# Patient Record
Sex: Female | Born: 1945 | Race: White | Hispanic: No | Smoking: Former smoker
Health system: Southern US, Community
[De-identification: ages and names within clinical notes are randomized; demographics above are authoritative.]

## PROBLEM LIST (undated history)

## (undated) DIAGNOSIS — K5732 Diverticulitis of large intestine without perforation or abscess without bleeding: Secondary | ICD-10-CM

## (undated) DIAGNOSIS — K5909 Other constipation: Secondary | ICD-10-CM

## (undated) DIAGNOSIS — K219 Gastro-esophageal reflux disease without esophagitis: Secondary | ICD-10-CM

## (undated) DIAGNOSIS — K8020 Calculus of gallbladder without cholecystitis without obstruction: Secondary | ICD-10-CM

## (undated) DIAGNOSIS — R404 Transient alteration of awareness: Secondary | ICD-10-CM

## (undated) DIAGNOSIS — E538 Deficiency of other specified B group vitamins: Secondary | ICD-10-CM

## (undated) DIAGNOSIS — R7402 Elevation of levels of lactic acid dehydrogenase (LDH): Secondary | ICD-10-CM

## (undated) DIAGNOSIS — N816 Rectocele: Secondary | ICD-10-CM

## (undated) DIAGNOSIS — R7401 Elevation of levels of liver transaminase levels: Secondary | ICD-10-CM

## (undated) DIAGNOSIS — G35 Multiple sclerosis: Secondary | ICD-10-CM

## (undated) DIAGNOSIS — E785 Hyperlipidemia, unspecified: Secondary | ICD-10-CM

## (undated) DIAGNOSIS — E559 Vitamin D deficiency, unspecified: Secondary | ICD-10-CM

## (undated) DIAGNOSIS — F341 Dysthymic disorder: Secondary | ICD-10-CM

## (undated) DIAGNOSIS — R3919 Other difficulties with micturition: Secondary | ICD-10-CM

## (undated) DIAGNOSIS — M810 Age-related osteoporosis without current pathological fracture: Secondary | ICD-10-CM

## (undated) DIAGNOSIS — I10 Essential (primary) hypertension: Secondary | ICD-10-CM

## (undated) DIAGNOSIS — R5383 Other fatigue: Secondary | ICD-10-CM

## (undated) DIAGNOSIS — R51 Headache: Secondary | ICD-10-CM

## (undated) DIAGNOSIS — R5381 Other malaise: Secondary | ICD-10-CM

## (undated) DIAGNOSIS — G35D Multiple sclerosis, unspecified: Secondary | ICD-10-CM

## (undated) DIAGNOSIS — R740 Nonspecific elevation of levels of transaminase and lactic acid dehydrogenase [LDH]: Secondary | ICD-10-CM

## (undated) DIAGNOSIS — M24549 Contracture, unspecified hand: Secondary | ICD-10-CM

## (undated) DIAGNOSIS — R32 Unspecified urinary incontinence: Secondary | ICD-10-CM

## (undated) HISTORY — DX: Other fatigue: R53.83

## (undated) HISTORY — DX: Contracture, unspecified hand: M24.549

## (undated) HISTORY — DX: Diverticulitis of large intestine without perforation or abscess without bleeding: K57.32

## (undated) HISTORY — DX: Elevation of levels of liver transaminase levels: R74.01

## (undated) HISTORY — DX: Other constipation: K59.09

## (undated) HISTORY — DX: Multiple sclerosis, unspecified: G35.D

## (undated) HISTORY — DX: Age-related osteoporosis without current pathological fracture: M81.0

## (undated) HISTORY — DX: Gastro-esophageal reflux disease without esophagitis: K21.9

## (undated) HISTORY — DX: Dysthymic disorder: F34.1

## (undated) HISTORY — PX: TUBAL LIGATION: SHX77

## (undated) HISTORY — DX: Unspecified urinary incontinence: R32

## (undated) HISTORY — DX: Transient alteration of awareness: R40.4

## (undated) HISTORY — DX: Other difficulties with micturition: R39.19

## (undated) HISTORY — DX: Deficiency of other specified B group vitamins: E53.8

## (undated) HISTORY — DX: Calculus of gallbladder without cholecystitis without obstruction: K80.20

## (undated) HISTORY — DX: Other malaise: R53.81

## (undated) HISTORY — PX: BACLOFEN PUMP REFILL: SHX1212

## (undated) HISTORY — DX: Hyperlipidemia, unspecified: E78.5

## (undated) HISTORY — DX: Essential (primary) hypertension: I10

## (undated) HISTORY — DX: Elevation of levels of lactic acid dehydrogenase (LDH): R74.02

## (undated) HISTORY — DX: Vitamin D deficiency, unspecified: E55.9

## (undated) HISTORY — PX: OTHER SURGICAL HISTORY: SHX169

## (undated) HISTORY — DX: Rectocele: N81.6

## (undated) HISTORY — DX: Other fatigue: R53.81

## (undated) HISTORY — DX: Headache: R51

## (undated) HISTORY — DX: Nonspecific elevation of levels of transaminase and lactic acid dehydrogenase (ldh): R74.0

## (undated) HISTORY — DX: Multiple sclerosis: G35

## (undated) HISTORY — PX: TONSILLECTOMY AND ADENOIDECTOMY: SUR1326

---

## 1995-06-01 HISTORY — PX: OTHER SURGICAL HISTORY: SHX169

## 2002-09-29 HISTORY — PX: OTHER SURGICAL HISTORY: SHX169

## 2004-05-31 ENCOUNTER — Encounter: Payer: Self-pay | Admitting: Family Medicine

## 2004-05-31 LAB — HM MAMMOGRAPHY

## 2004-05-31 LAB — CONVERTED CEMR LAB

## 2005-07-29 ENCOUNTER — Ambulatory Visit: Payer: Self-pay | Admitting: Family Medicine

## 2005-09-07 ENCOUNTER — Ambulatory Visit: Payer: Self-pay | Admitting: Family Medicine

## 2005-11-15 ENCOUNTER — Ambulatory Visit: Payer: Self-pay | Admitting: Family Medicine

## 2005-12-02 ENCOUNTER — Ambulatory Visit: Payer: Self-pay | Admitting: Internal Medicine

## 2005-12-21 ENCOUNTER — Ambulatory Visit: Payer: Self-pay | Admitting: Family Medicine

## 2006-01-25 ENCOUNTER — Ambulatory Visit: Payer: Self-pay | Admitting: Family Medicine

## 2006-06-17 ENCOUNTER — Ambulatory Visit: Payer: Self-pay | Admitting: Family Medicine

## 2006-08-23 ENCOUNTER — Encounter: Admission: RE | Admit: 2006-08-23 | Discharge: 2006-08-23 | Payer: Self-pay | Admitting: Neurology

## 2006-11-04 ENCOUNTER — Encounter: Payer: Self-pay | Admitting: Family Medicine

## 2006-11-04 DIAGNOSIS — R51 Headache: Secondary | ICD-10-CM

## 2006-11-04 DIAGNOSIS — I10 Essential (primary) hypertension: Secondary | ICD-10-CM | POA: Insufficient documentation

## 2006-11-04 DIAGNOSIS — G35 Multiple sclerosis: Secondary | ICD-10-CM

## 2006-11-04 DIAGNOSIS — R519 Headache, unspecified: Secondary | ICD-10-CM | POA: Insufficient documentation

## 2006-11-04 DIAGNOSIS — N816 Rectocele: Secondary | ICD-10-CM | POA: Insufficient documentation

## 2006-11-04 DIAGNOSIS — K219 Gastro-esophageal reflux disease without esophagitis: Secondary | ICD-10-CM | POA: Insufficient documentation

## 2006-11-04 DIAGNOSIS — N319 Neuromuscular dysfunction of bladder, unspecified: Secondary | ICD-10-CM

## 2006-11-04 DIAGNOSIS — M810 Age-related osteoporosis without current pathological fracture: Secondary | ICD-10-CM | POA: Insufficient documentation

## 2006-11-04 DIAGNOSIS — E785 Hyperlipidemia, unspecified: Secondary | ICD-10-CM

## 2006-11-08 ENCOUNTER — Ambulatory Visit: Payer: Self-pay | Admitting: Family Medicine

## 2006-11-09 LAB — CONVERTED CEMR LAB: Phosphorus: 3.4 mg/dL (ref 2.3–4.6)

## 2006-11-10 LAB — CONVERTED CEMR LAB: Vit D, 1,25-Dihydroxy: 26 (ref 20–57)

## 2006-11-13 ENCOUNTER — Encounter: Payer: Self-pay | Admitting: Family Medicine

## 2006-12-08 ENCOUNTER — Telehealth (INDEPENDENT_AMBULATORY_CARE_PROVIDER_SITE_OTHER): Payer: Self-pay | Admitting: *Deleted

## 2006-12-12 ENCOUNTER — Encounter: Payer: Self-pay | Admitting: Family Medicine

## 2006-12-14 ENCOUNTER — Encounter: Payer: Self-pay | Admitting: Family Medicine

## 2007-01-03 ENCOUNTER — Encounter: Payer: Self-pay | Admitting: Family Medicine

## 2007-01-17 ENCOUNTER — Ambulatory Visit: Payer: Self-pay | Admitting: Family Medicine

## 2007-04-06 ENCOUNTER — Encounter: Payer: Self-pay | Admitting: Family Medicine

## 2007-04-17 ENCOUNTER — Telehealth: Payer: Self-pay | Admitting: Family Medicine

## 2007-06-16 ENCOUNTER — Telehealth: Payer: Self-pay | Admitting: Family Medicine

## 2007-06-18 ENCOUNTER — Telehealth: Payer: Self-pay | Admitting: Internal Medicine

## 2007-06-26 ENCOUNTER — Telehealth (INDEPENDENT_AMBULATORY_CARE_PROVIDER_SITE_OTHER): Payer: Self-pay | Admitting: *Deleted

## 2007-07-10 ENCOUNTER — Encounter: Payer: Self-pay | Admitting: Internal Medicine

## 2007-07-11 ENCOUNTER — Encounter: Payer: Self-pay | Admitting: Family Medicine

## 2007-07-23 ENCOUNTER — Telehealth: Payer: Self-pay | Admitting: Internal Medicine

## 2007-07-23 ENCOUNTER — Inpatient Hospital Stay: Payer: Self-pay | Admitting: Internal Medicine

## 2007-07-23 ENCOUNTER — Encounter: Payer: Self-pay | Admitting: Family Medicine

## 2007-07-24 ENCOUNTER — Encounter: Payer: Self-pay | Admitting: Family Medicine

## 2007-07-27 ENCOUNTER — Encounter: Payer: Self-pay | Admitting: Family Medicine

## 2007-08-02 ENCOUNTER — Encounter: Payer: Self-pay | Admitting: Family Medicine

## 2007-08-03 ENCOUNTER — Ambulatory Visit: Payer: Self-pay | Admitting: Family Medicine

## 2007-08-03 ENCOUNTER — Telehealth: Payer: Self-pay | Admitting: Family Medicine

## 2007-08-03 DIAGNOSIS — K5909 Other constipation: Secondary | ICD-10-CM | POA: Insufficient documentation

## 2007-08-03 DIAGNOSIS — K8020 Calculus of gallbladder without cholecystitis without obstruction: Secondary | ICD-10-CM | POA: Insufficient documentation

## 2007-08-03 DIAGNOSIS — K5732 Diverticulitis of large intestine without perforation or abscess without bleeding: Secondary | ICD-10-CM | POA: Insufficient documentation

## 2007-08-09 ENCOUNTER — Telehealth: Payer: Self-pay | Admitting: Internal Medicine

## 2007-08-14 ENCOUNTER — Telehealth: Payer: Self-pay | Admitting: Family Medicine

## 2007-08-17 ENCOUNTER — Ambulatory Visit: Payer: Self-pay | Admitting: Unknown Physician Specialty

## 2007-08-22 ENCOUNTER — Ambulatory Visit: Payer: Self-pay | Admitting: Family Medicine

## 2007-08-24 ENCOUNTER — Encounter: Payer: Self-pay | Admitting: Family Medicine

## 2007-08-25 ENCOUNTER — Telehealth: Payer: Self-pay | Admitting: Family Medicine

## 2007-09-06 ENCOUNTER — Ambulatory Visit: Payer: Self-pay | Admitting: Family Medicine

## 2007-09-06 DIAGNOSIS — R740 Nonspecific elevation of levels of transaminase and lactic acid dehydrogenase [LDH]: Secondary | ICD-10-CM

## 2007-09-06 DIAGNOSIS — R7402 Elevation of levels of lactic acid dehydrogenase (LDH): Secondary | ICD-10-CM | POA: Insufficient documentation

## 2007-09-12 LAB — CONVERTED CEMR LAB
Albumin: 4.1 g/dL (ref 3.5–5.2)
Alkaline Phosphatase: 61 units/L (ref 39–117)
Bilirubin, Direct: 0.1 mg/dL (ref 0.0–0.3)

## 2007-09-29 ENCOUNTER — Encounter: Payer: Self-pay | Admitting: Internal Medicine

## 2007-10-10 ENCOUNTER — Telehealth: Payer: Self-pay | Admitting: Family Medicine

## 2007-10-13 ENCOUNTER — Telehealth: Payer: Self-pay | Admitting: Internal Medicine

## 2008-01-10 ENCOUNTER — Telehealth (INDEPENDENT_AMBULATORY_CARE_PROVIDER_SITE_OTHER): Payer: Self-pay | Admitting: *Deleted

## 2008-02-20 ENCOUNTER — Telehealth: Payer: Self-pay | Admitting: Family Medicine

## 2008-04-02 ENCOUNTER — Telehealth: Payer: Self-pay | Admitting: Family Medicine

## 2008-05-29 ENCOUNTER — Ambulatory Visit: Payer: Self-pay | Admitting: Family Medicine

## 2008-05-29 DIAGNOSIS — F341 Dysthymic disorder: Secondary | ICD-10-CM | POA: Insufficient documentation

## 2008-05-29 DIAGNOSIS — R5383 Other fatigue: Secondary | ICD-10-CM

## 2008-05-29 DIAGNOSIS — R5381 Other malaise: Secondary | ICD-10-CM

## 2008-06-03 LAB — CONVERTED CEMR LAB
ALT: 38 units/L — ABNORMAL HIGH (ref 0–35)
Albumin: 4.4 g/dL (ref 3.5–5.2)
Alkaline Phosphatase: 55 units/L (ref 39–117)
Calcium: 9.7 mg/dL (ref 8.4–10.5)
Chloride: 105 meq/L (ref 96–112)
Creatinine, Ser: 0.8 mg/dL (ref 0.4–1.2)
GFR calc Af Amer: 94 mL/min
GFR calc non Af Amer: 78 mL/min
HCT: 40.6 % (ref 36.0–46.0)
Hemoglobin: 14.3 g/dL (ref 12.0–15.0)
MCHC: 35.2 g/dL (ref 30.0–36.0)
Monocytes Absolute: 0.5 10*3/uL (ref 0.1–1.0)
Monocytes Relative: 7.5 % (ref 3.0–12.0)
Neutro Abs: 3.2 10*3/uL (ref 1.4–7.7)
Phosphorus: 4 mg/dL (ref 2.3–4.6)
RDW: 12.6 % (ref 11.5–14.6)
Total Protein: 7.3 g/dL (ref 6.0–8.3)
Vitamin B-12: 177 pg/mL — ABNORMAL LOW (ref 211–911)

## 2008-06-04 ENCOUNTER — Ambulatory Visit: Payer: Self-pay | Admitting: Family Medicine

## 2008-06-04 DIAGNOSIS — E538 Deficiency of other specified B group vitamins: Secondary | ICD-10-CM

## 2008-06-11 ENCOUNTER — Ambulatory Visit: Payer: Self-pay | Admitting: Family Medicine

## 2008-06-17 ENCOUNTER — Ambulatory Visit: Payer: Self-pay | Admitting: Family Medicine

## 2008-07-05 ENCOUNTER — Ambulatory Visit: Payer: Self-pay | Admitting: Family Medicine

## 2008-07-05 DIAGNOSIS — E559 Vitamin D deficiency, unspecified: Secondary | ICD-10-CM | POA: Insufficient documentation

## 2008-08-19 ENCOUNTER — Ambulatory Visit: Payer: Self-pay | Admitting: Family Medicine

## 2008-08-21 LAB — CONVERTED CEMR LAB: Vit D, 25-Hydroxy: 56 ng/mL (ref 30–89)

## 2008-10-07 ENCOUNTER — Ambulatory Visit: Payer: Self-pay | Admitting: Family Medicine

## 2008-10-07 ENCOUNTER — Telehealth: Payer: Self-pay | Admitting: Family Medicine

## 2008-11-18 ENCOUNTER — Ambulatory Visit: Payer: Self-pay | Admitting: Family Medicine

## 2008-11-21 ENCOUNTER — Ambulatory Visit: Payer: Self-pay | Admitting: Family Medicine

## 2008-11-25 ENCOUNTER — Encounter (INDEPENDENT_AMBULATORY_CARE_PROVIDER_SITE_OTHER): Payer: Self-pay | Admitting: *Deleted

## 2008-11-25 LAB — CONVERTED CEMR LAB: Vit D, 25-Hydroxy: 41 ng/mL (ref 30–89)

## 2008-12-29 HISTORY — PX: OTHER SURGICAL HISTORY: SHX169

## 2009-01-03 ENCOUNTER — Encounter: Payer: Self-pay | Admitting: Family Medicine

## 2009-01-07 ENCOUNTER — Telehealth: Payer: Self-pay | Admitting: Family Medicine

## 2009-01-23 LAB — HM DEXA SCAN

## 2009-01-28 ENCOUNTER — Ambulatory Visit: Payer: Self-pay | Admitting: Family Medicine

## 2009-01-29 ENCOUNTER — Telehealth: Payer: Self-pay | Admitting: Family Medicine

## 2009-02-05 ENCOUNTER — Ambulatory Visit: Payer: Self-pay | Admitting: Psychiatry

## 2009-02-06 ENCOUNTER — Encounter: Payer: Self-pay | Admitting: Family Medicine

## 2009-02-06 ENCOUNTER — Telehealth: Payer: Self-pay | Admitting: Family Medicine

## 2009-02-10 ENCOUNTER — Telehealth: Payer: Self-pay | Admitting: Family Medicine

## 2009-02-12 ENCOUNTER — Ambulatory Visit: Payer: Self-pay | Admitting: Psychiatry

## 2009-02-17 ENCOUNTER — Ambulatory Visit: Payer: Self-pay | Admitting: Psychiatry

## 2009-02-19 IMAGING — CT CT ABD-PELV W/ CM
1 of 2 series · 15 of 32 positions shown, 19 images · non-contrast
Comparison: none

REASON FOR EXAM: (1) LLQ pain; (2) LLQ pain, oral and IV contrast
COMMENTS:

[Series 2: abdomen · axial · 0.68mm/px · z∈[-532,-84]mm · 15 of 62 slices shown, 19 images]
[im 3/62  soft-tissue]
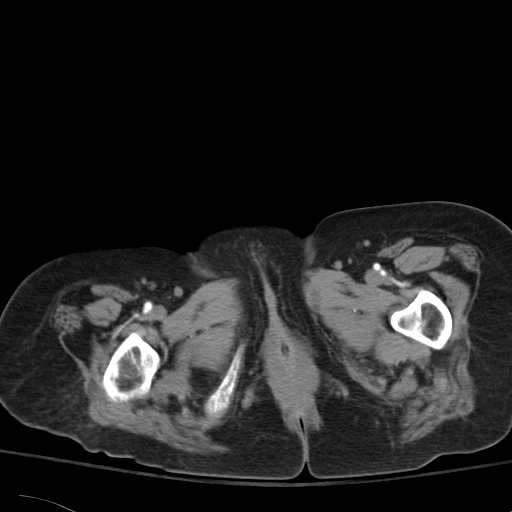
[im 3/62  bone]
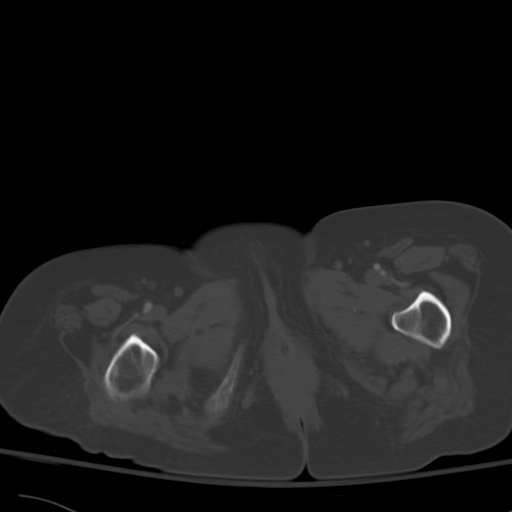
[im 8/62  soft-tissue]
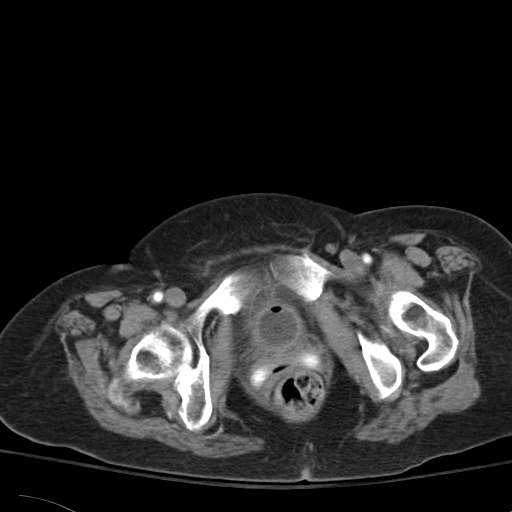
[im 14/62  soft-tissue]
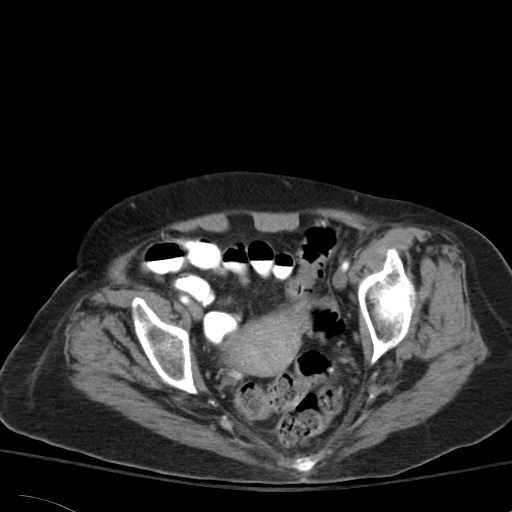
[im 16/62  soft-tissue]
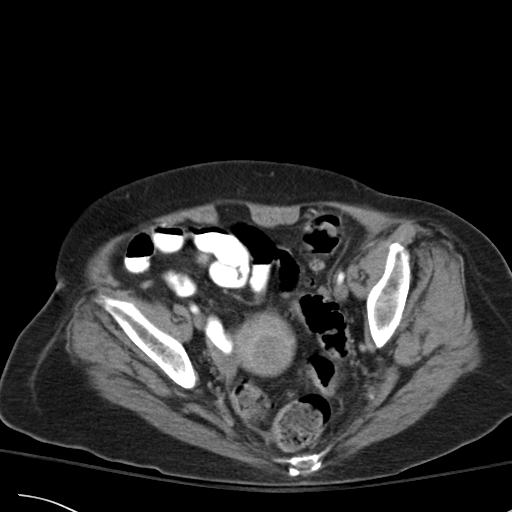
[im 22/62  soft-tissue]
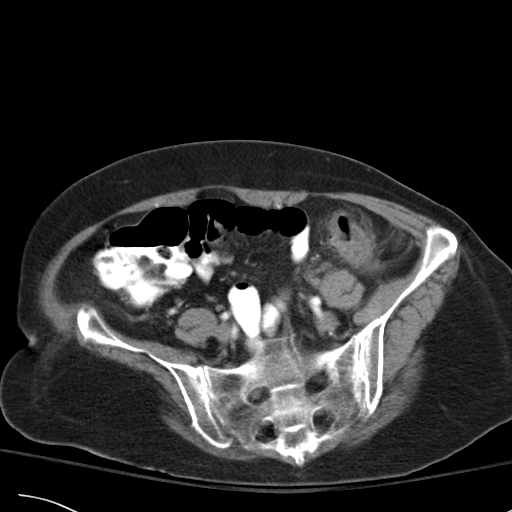
[im 27/62  soft-tissue]
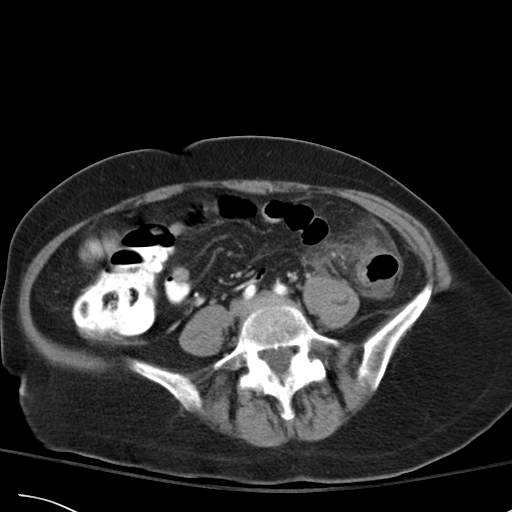
[im 32/62  soft-tissue]
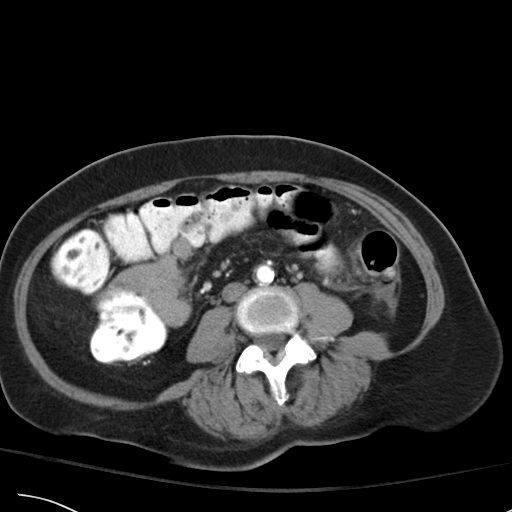
[im 35/62  soft-tissue]
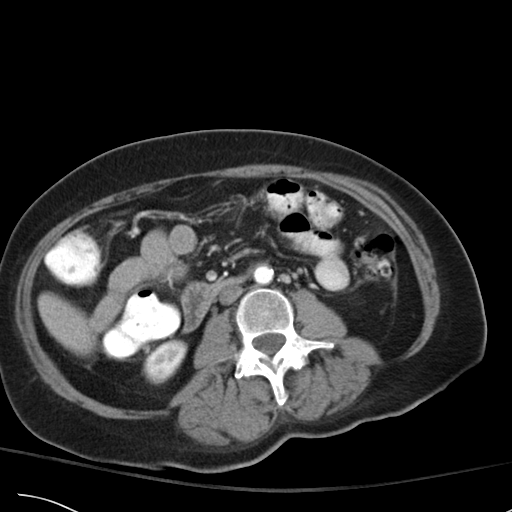
[im 40/62  soft-tissue]
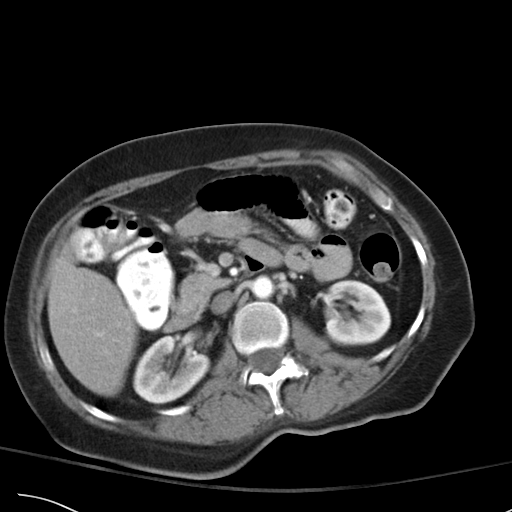
[im 40/62  bone]
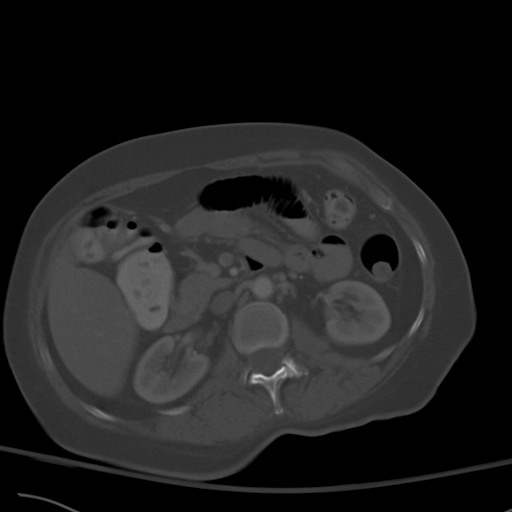
[im 46/62  soft-tissue]
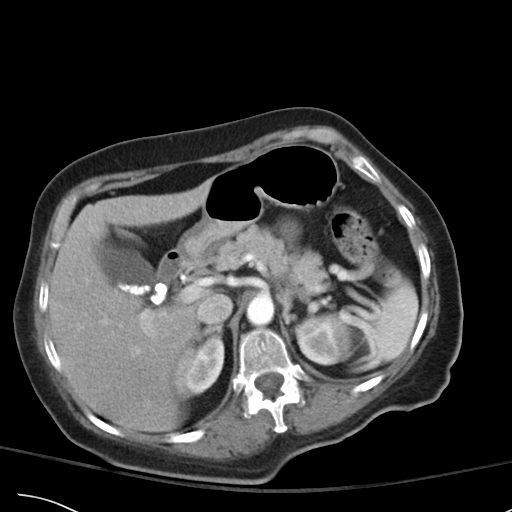
[im 48/62  soft-tissue]
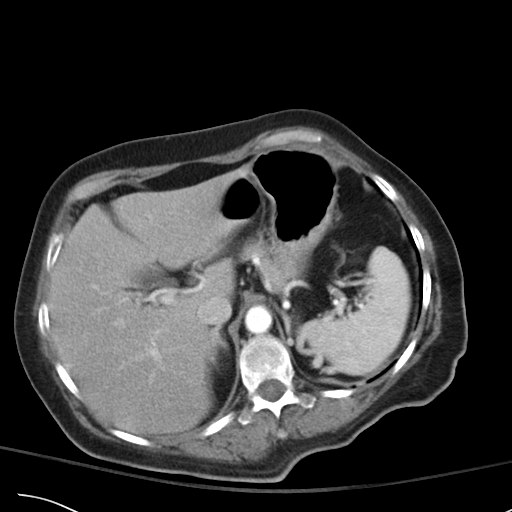
[im 51/62  lung]
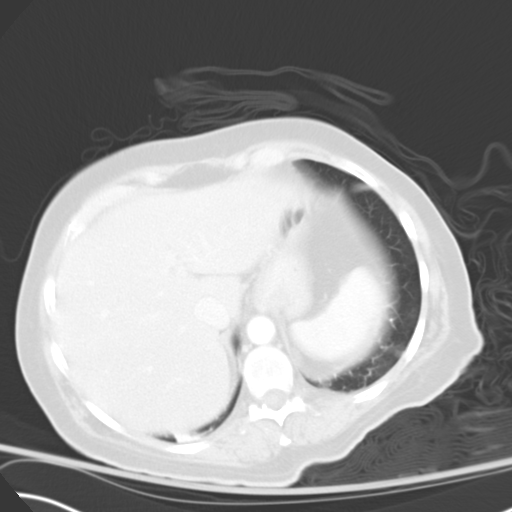
[im 54/62  soft-tissue]
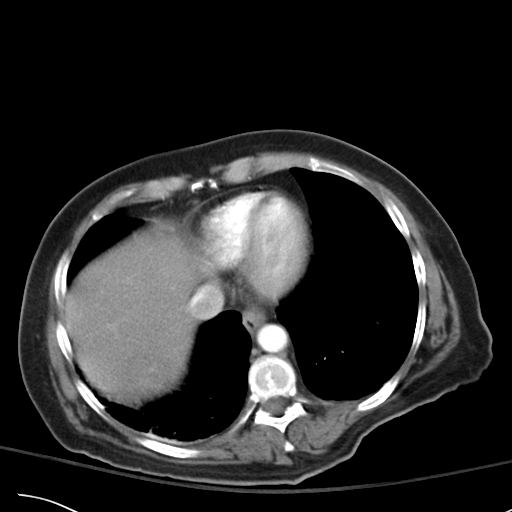
[im 54/62  lung]
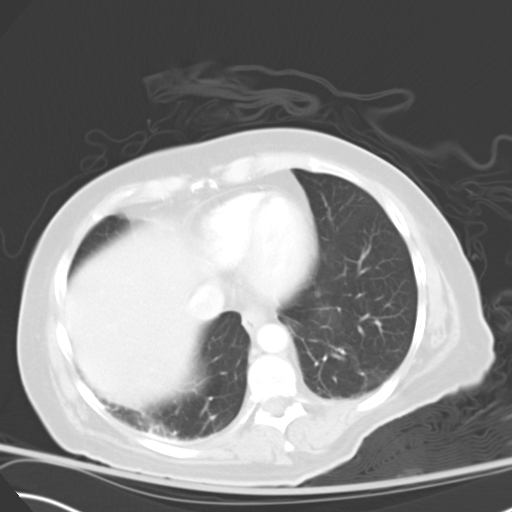
[im 56/62  lung]
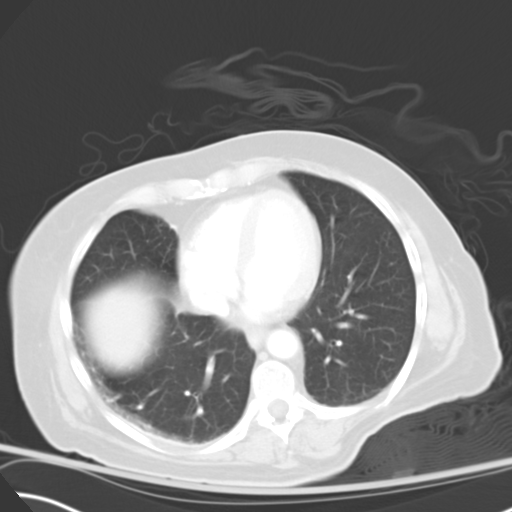
[im 59/62  soft-tissue]
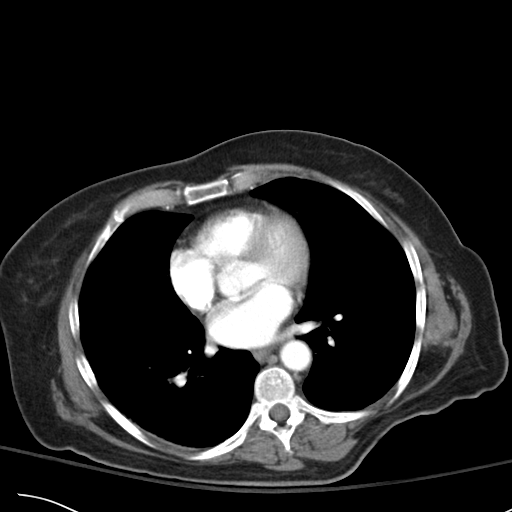
[im 59/62  lung]
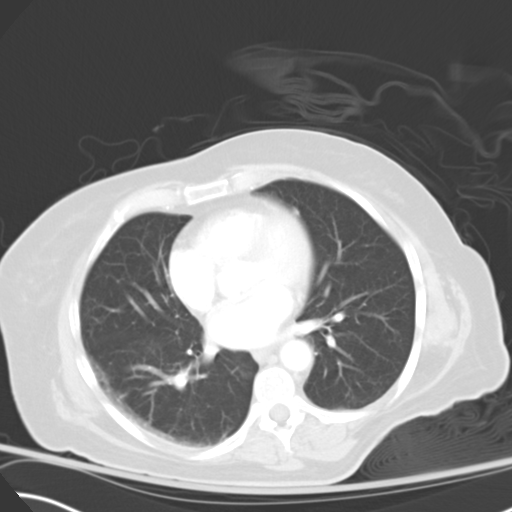

[15 of 32 positions shown; findings below may reference images not displayed]

PROCEDURE:     CT  - CT ABDOMEN / PELVIS  W  - July 23, 2007  [DATE]

RESULT:     Helical 8 mm segments were obtained from the lung bases through
the pubic symphysis status post intravenous administration of 100 ml of
Isovue 370 contrast.  Evaluation of the lung bases demonstrates mild
hyperventilation within the posterior periphery of the lung base.

The liver, spleen, adrenals, pancreas, and kidneys are unremarkable.
Calcified gallstones demonstrated within the dependent portion of the
gallbladder.  There is no evidence of an abdominal aortic aneurysm.  The
celiac, SMA, IMA, portal vein and SMV are opacified.  Evaluation of the
descending colon demonstrates an area of bowel wall thickening within the
distal descending colon surrounded by inflammatory change within the
pericolonic fat and a small amount of fluid.  No drainable loculated fluid
collections are identified.  There does not appear to be evidence of free
air.  Mild diverticulosis is identified within this region and differential
considerations are:  1) Diverticulitis 2) Colitis, inflammatory versus
infectious.  Evaluation of the pelvis demonstrates no evidence of definitive
mass nor loculated fluid collections.  There does not appear to be evidence
of masses nor adenopathy.
IMPRESSION: CT findings consistent with inflammatory change within the
descending colon.  Differential considerations are diverticulitis versus
inflammation secondary to an infection or noninfectious etiology.
Definitive mass or loculated fluid collection is not identified.
Surveillance evaluation recommended status post appropriate therapy regimen.

## 2009-02-20 IMAGING — US ABDOMEN ULTRASOUND
1 series · 17 of 25 positions shown · non-contrast
Comparison: none

REASON FOR EXAM: elevated LFT
COMMENTS:

[Series 1: abdomen ultrasound · 17 of 55 slices shown]
[im 1/55]
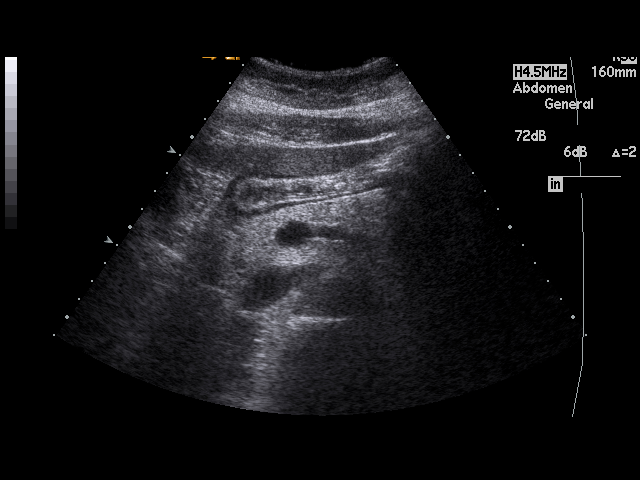
[im 5/55]
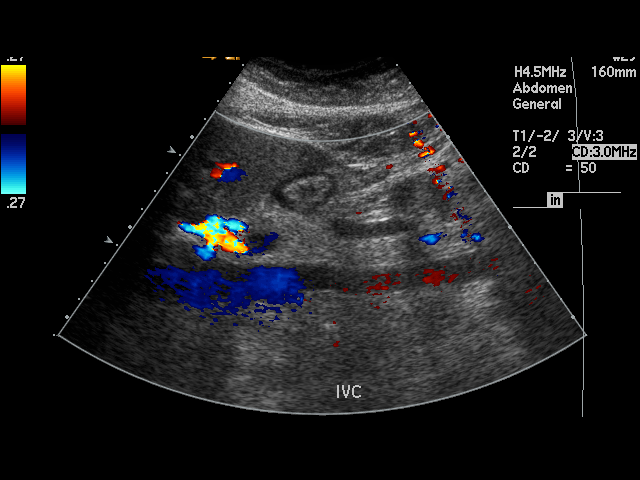
[im 7/55]
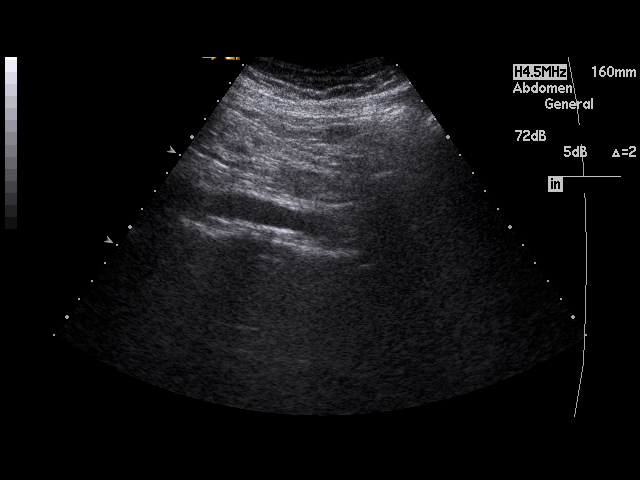
[im 12/55]
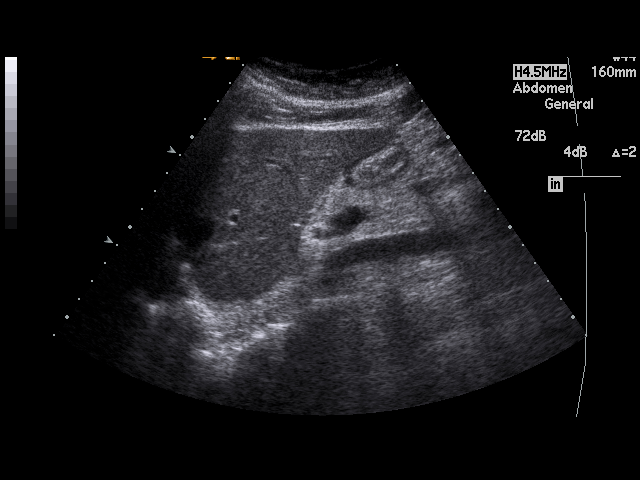
[im 14/55]
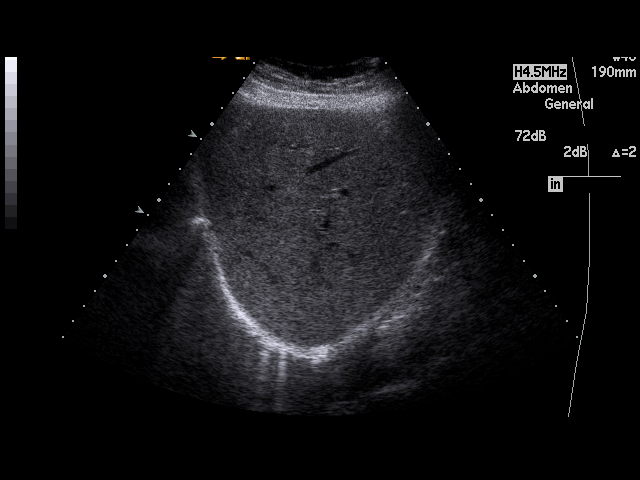
[im 19/55]
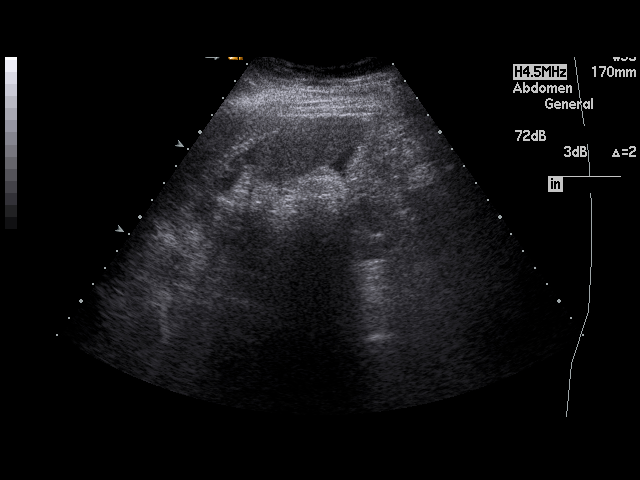
[im 21/55]
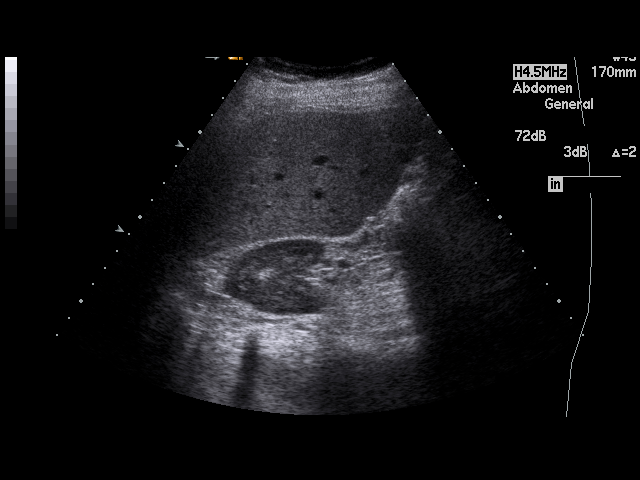
[im 25/55]
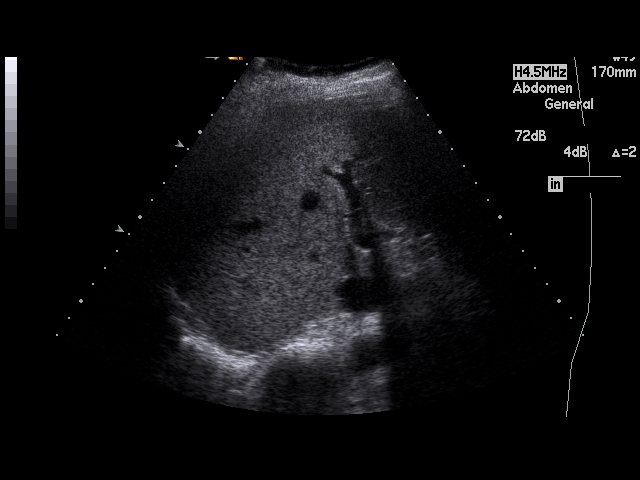
[im 28/55]
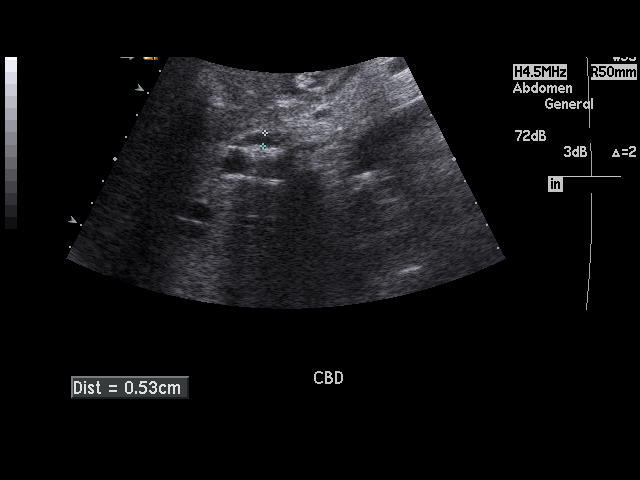
[im 30/55]
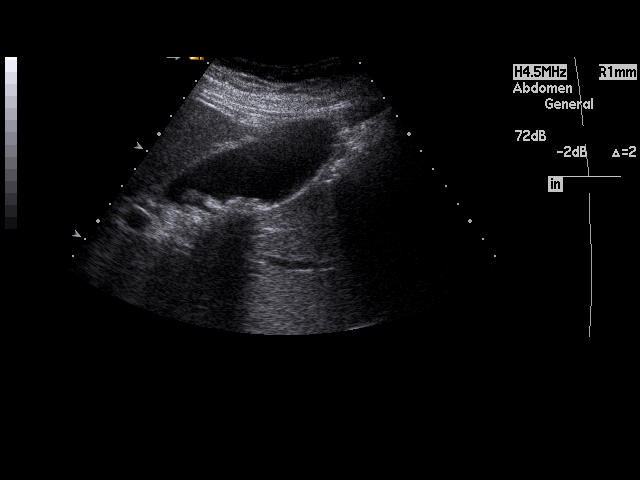
[im 34/55]
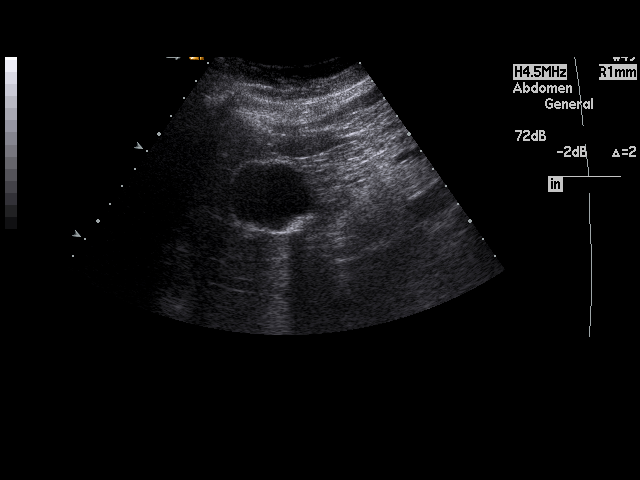
[im 37/55]
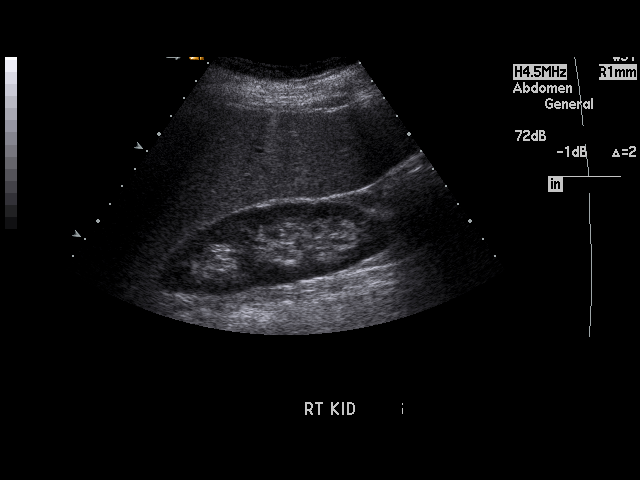
[im 41/55]
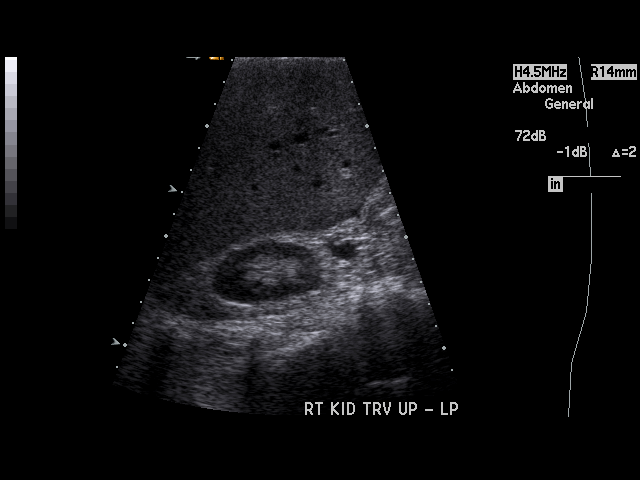
[im 43/55]
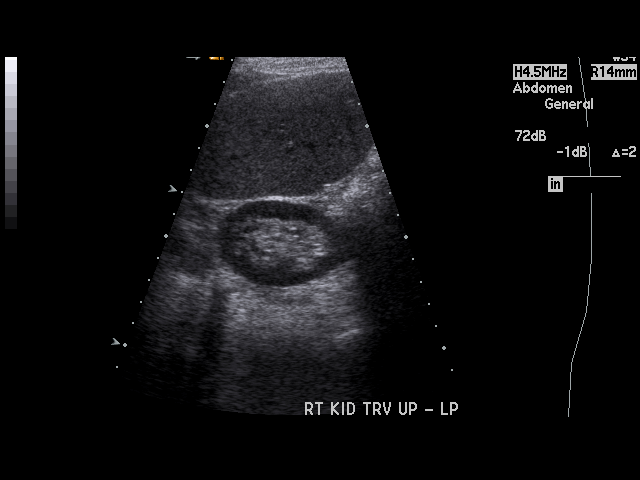
[im 48/55]
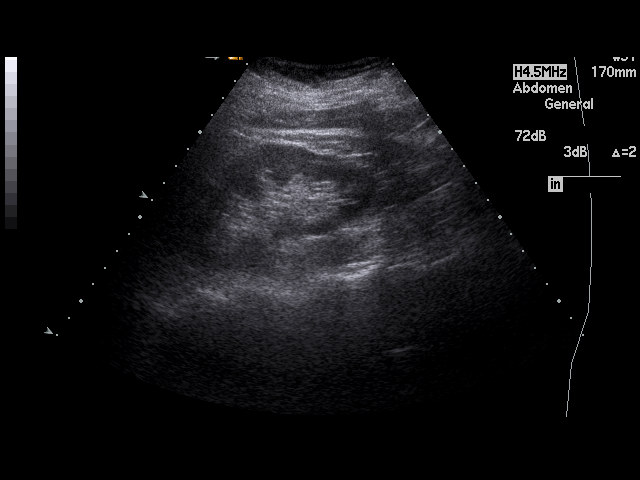
[im 50/55]
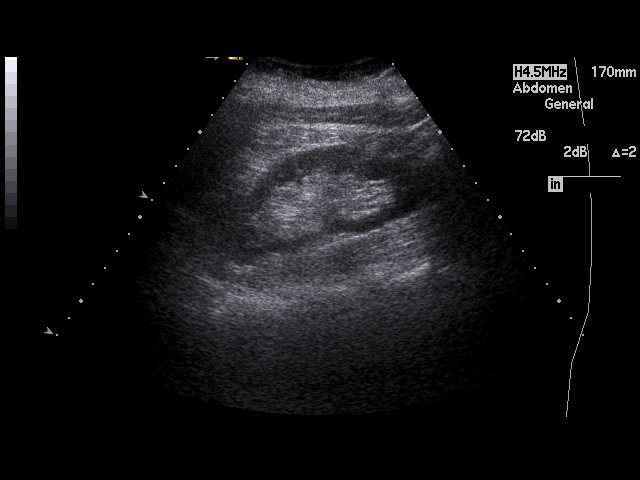
[im 55/55]
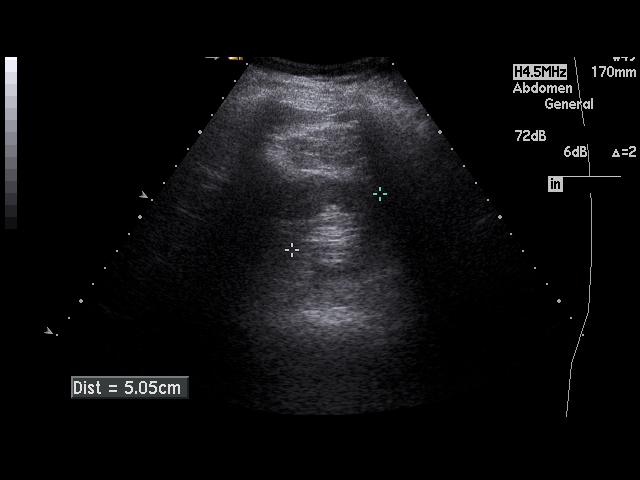

[17 of 25 positions shown; findings below may reference images not displayed]

PROCEDURE:     US  - US ABDOMEN GENERAL SURVEY  - July 24, 2007  [DATE]

RESULT:     The liver exhibits normal echotexture with no mass or ductal
dilation. Portal venous flow is normal in direction toward the liver. There
is no more than a trace of fluid surrounding the liver. The gallbladder
exhibits echogenic mobile shadowing stones. There is no sonographic Murphy's
sign. The gallbladder wall does not appear significantly thickened and there
is no pericholecystic fluid. The common bile duct is normal at 5.3 mm in
diameter. The pancreas, spleen, abdominal aorta, and kidneys are normal in
appearance.
IMPRESSION: There are gallstones present. There is a trace of ascites. There is no
sonographic Murphy's sign.

## 2009-03-03 ENCOUNTER — Ambulatory Visit: Payer: Self-pay | Admitting: Psychiatry

## 2009-03-10 ENCOUNTER — Ambulatory Visit: Payer: Self-pay | Admitting: Psychiatry

## 2009-03-17 ENCOUNTER — Ambulatory Visit: Payer: Self-pay | Admitting: Psychiatry

## 2009-03-24 ENCOUNTER — Ambulatory Visit: Payer: Self-pay | Admitting: Psychiatry

## 2009-03-31 ENCOUNTER — Ambulatory Visit: Payer: Self-pay | Admitting: Psychiatry

## 2009-04-01 ENCOUNTER — Ambulatory Visit: Payer: Self-pay | Admitting: Family Medicine

## 2009-04-07 ENCOUNTER — Ambulatory Visit: Payer: Self-pay | Admitting: Psychiatry

## 2009-04-09 ENCOUNTER — Ambulatory Visit: Payer: Self-pay | Admitting: Family Medicine

## 2009-04-11 LAB — CONVERTED CEMR LAB: Vit D, 25-Hydroxy: 52 ng/mL (ref 30–89)

## 2009-04-14 ENCOUNTER — Ambulatory Visit: Payer: Self-pay | Admitting: Psychiatry

## 2009-04-21 ENCOUNTER — Ambulatory Visit: Payer: Self-pay | Admitting: Psychiatry

## 2009-04-23 ENCOUNTER — Telehealth: Payer: Self-pay | Admitting: Family Medicine

## 2009-04-28 ENCOUNTER — Ambulatory Visit: Payer: Self-pay | Admitting: Psychiatry

## 2009-05-05 ENCOUNTER — Ambulatory Visit: Payer: Self-pay | Admitting: Psychiatry

## 2009-05-07 ENCOUNTER — Ambulatory Visit: Payer: Self-pay | Admitting: Family Medicine

## 2009-05-12 ENCOUNTER — Ambulatory Visit: Payer: Self-pay | Admitting: Psychiatry

## 2009-05-19 ENCOUNTER — Ambulatory Visit: Payer: Self-pay | Admitting: Psychiatry

## 2009-06-02 ENCOUNTER — Telehealth: Payer: Self-pay | Admitting: Family Medicine

## 2009-06-02 ENCOUNTER — Ambulatory Visit: Payer: Self-pay | Admitting: Psychiatry

## 2009-06-09 ENCOUNTER — Ambulatory Visit: Payer: Self-pay | Admitting: Psychiatry

## 2009-06-16 ENCOUNTER — Ambulatory Visit: Payer: Self-pay | Admitting: Psychiatry

## 2009-06-17 ENCOUNTER — Telehealth: Payer: Self-pay | Admitting: Family Medicine

## 2009-06-23 ENCOUNTER — Ambulatory Visit: Payer: Self-pay | Admitting: Psychiatry

## 2009-06-30 ENCOUNTER — Ambulatory Visit: Payer: Self-pay | Admitting: Psychiatry

## 2009-07-02 ENCOUNTER — Ambulatory Visit: Payer: Self-pay | Admitting: Family Medicine

## 2009-07-07 ENCOUNTER — Ambulatory Visit: Payer: Self-pay | Admitting: Psychiatry

## 2009-07-14 ENCOUNTER — Ambulatory Visit: Payer: Self-pay | Admitting: Psychiatry

## 2009-07-15 ENCOUNTER — Encounter: Payer: Self-pay | Admitting: Family Medicine

## 2009-07-20 ENCOUNTER — Encounter: Payer: Self-pay | Admitting: Family Medicine

## 2009-07-21 ENCOUNTER — Telehealth: Payer: Self-pay | Admitting: Family Medicine

## 2009-07-21 ENCOUNTER — Inpatient Hospital Stay: Payer: Self-pay | Admitting: Student

## 2009-07-28 ENCOUNTER — Encounter: Payer: Self-pay | Admitting: Family Medicine

## 2009-08-06 ENCOUNTER — Ambulatory Visit: Payer: Self-pay | Admitting: Psychiatry

## 2009-08-25 ENCOUNTER — Ambulatory Visit: Payer: Self-pay | Admitting: Family Medicine

## 2009-08-26 ENCOUNTER — Encounter: Payer: Self-pay | Admitting: Family Medicine

## 2009-08-27 ENCOUNTER — Ambulatory Visit: Payer: Self-pay | Admitting: Family Medicine

## 2009-09-01 ENCOUNTER — Ambulatory Visit: Payer: Self-pay | Admitting: Psychiatry

## 2009-09-11 ENCOUNTER — Encounter: Payer: Self-pay | Admitting: Family Medicine

## 2009-09-15 ENCOUNTER — Ambulatory Visit: Payer: Self-pay | Admitting: Psychiatry

## 2009-09-26 ENCOUNTER — Encounter: Payer: Self-pay | Admitting: Family Medicine

## 2009-09-29 ENCOUNTER — Ambulatory Visit: Payer: Self-pay | Admitting: Psychiatry

## 2009-10-13 ENCOUNTER — Ambulatory Visit: Payer: Self-pay | Admitting: Psychiatry

## 2009-11-03 ENCOUNTER — Ambulatory Visit: Payer: Self-pay | Admitting: Psychiatry

## 2009-11-06 ENCOUNTER — Telehealth: Payer: Self-pay | Admitting: Family Medicine

## 2009-11-17 ENCOUNTER — Ambulatory Visit: Payer: Self-pay | Admitting: Psychiatry

## 2009-11-21 ENCOUNTER — Encounter: Payer: Self-pay | Admitting: Family Medicine

## 2009-11-26 ENCOUNTER — Telehealth: Payer: Self-pay | Admitting: Family Medicine

## 2009-12-04 ENCOUNTER — Telehealth: Payer: Self-pay | Admitting: Family Medicine

## 2009-12-05 ENCOUNTER — Encounter: Payer: Self-pay | Admitting: Family Medicine

## 2009-12-08 ENCOUNTER — Ambulatory Visit: Payer: Self-pay | Admitting: Psychiatry

## 2009-12-22 ENCOUNTER — Ambulatory Visit: Payer: Self-pay | Admitting: Psychiatry

## 2009-12-23 ENCOUNTER — Telehealth: Payer: Self-pay | Admitting: Family Medicine

## 2010-01-05 ENCOUNTER — Ambulatory Visit: Payer: Self-pay | Admitting: Psychiatry

## 2010-01-05 ENCOUNTER — Encounter: Payer: Self-pay | Admitting: Family Medicine

## 2010-01-19 ENCOUNTER — Ambulatory Visit: Payer: Self-pay | Admitting: Psychiatry

## 2010-01-28 ENCOUNTER — Encounter: Payer: Self-pay | Admitting: Family Medicine

## 2010-02-09 ENCOUNTER — Ambulatory Visit: Payer: Self-pay | Admitting: Psychiatry

## 2010-02-23 ENCOUNTER — Ambulatory Visit: Payer: Self-pay | Admitting: Psychiatry

## 2010-03-03 ENCOUNTER — Encounter: Payer: Self-pay | Admitting: Family Medicine

## 2010-03-09 ENCOUNTER — Ambulatory Visit: Payer: Self-pay | Admitting: Psychiatry

## 2010-03-23 ENCOUNTER — Ambulatory Visit: Payer: Self-pay | Admitting: Psychiatry

## 2010-03-27 ENCOUNTER — Encounter: Payer: Self-pay | Admitting: Family Medicine

## 2010-03-30 ENCOUNTER — Encounter: Payer: Self-pay | Admitting: Family Medicine

## 2010-04-06 ENCOUNTER — Ambulatory Visit: Payer: Self-pay | Admitting: Psychiatry

## 2010-05-11 ENCOUNTER — Ambulatory Visit: Payer: Self-pay | Admitting: Psychiatry

## 2010-06-03 ENCOUNTER — Ambulatory Visit
Admission: RE | Admit: 2010-06-03 | Discharge: 2010-06-03 | Payer: Self-pay | Source: Home / Self Care | Attending: Psychiatry | Admitting: Psychiatry

## 2010-06-08 ENCOUNTER — Ambulatory Visit: Admit: 2010-06-08 | Payer: Self-pay | Admitting: Psychiatry

## 2010-06-08 ENCOUNTER — Encounter: Payer: Self-pay | Admitting: Family Medicine

## 2010-06-11 ENCOUNTER — Encounter: Payer: Self-pay | Admitting: Family Medicine

## 2010-06-22 ENCOUNTER — Ambulatory Visit
Admit: 2010-06-22 | Payer: Self-pay | Attending: Licensed Clinical Social Worker | Admitting: Licensed Clinical Social Worker

## 2010-06-30 NOTE — Progress Notes (Signed)
Summary: pt wants referral to neuro- urologist  Phone Note Call from Patient Call back at [phone]   Caller: Patient Call For: Judith Part MD Summary of Call: Pt would like to see a neuro-urologist in [place], her name is Dr.Kristy Borawski.  Phone number is [phone].  Pt is having a problem with dehydrating herself. Initial call taken by: Lowella Petties CMA,  November 06, 2009 11:40 AM  Follow-up for Phone Call        will do ref for [facility] Follow-up by: Judith Part MD,  November 06, 2009 11:46 AM

## 2010-06-30 NOTE — Consult Note (Signed)
Summary: [facility]  [facility]   Imported By: Lanelle Bal 07/18/2009 12:15:30  _____________________________________________________________________  External Attachment:    Type:   Image     Comment:   External Document

## 2010-06-30 NOTE — Consult Note (Signed)
Summary: [facility]  [facility]   Imported By: Lanelle Bal 12/02/2009 11:43:11  _____________________________________________________________________  External Attachment:    Type:   Image     Comment:   External Document

## 2010-06-30 NOTE — Letter (Signed)
Summary: Application for Handicapped Drivers Registration Plate  Application for Handicapped Drivers Registration Plate   Imported By: Maryln Gottron 01/09/2010 14:25:52  _____________________________________________________________________  External Attachment:    Type:   Image     Comment:   External Document

## 2010-06-30 NOTE — Progress Notes (Signed)
Summary: RX Lexapro  Phone Note Refill Request Call back at [phone] [facility] June 02, 2009 5:36 PM  Refills Requested: Medication #1:  LEXAPRO 10 MG TABS 1 by mouth once daily Received e-scribe refill request.   Method Requested: Electronic Initial call taken by: Sydell Axon LPN,  June 02, 2009 5:37 PM  Follow-up for Phone Call        px written on EMR for call in  Follow-up by: Judith Part MD,  June 03, 2009 7:59 AM  Additional Follow-up for Phone Call Additional follow up Details #1::        Sent electronically. Additional Follow-up by: Delilah Shan CMA (AAMA),  June 03, 2009 9:11 AM    Prescriptions: LEXAPRO 10 MG TABS (ESCITALOPRAM OXALATE) 1 by mouth once daily  #30 x 11   Entered and Authorized by:   Judith Part MD   Signed by:   Judith Part MD on 06/03/2009   Method used:   [facility]ph[facility] ...       [facility]  [address] [id number]       [address]       [place], [place]  [address]       Ph: [phone]       Fax: [phone]   RxID:   [id number]

## 2010-06-30 NOTE — Progress Notes (Signed)
Summary: call a nurse  Phone Note Call from Patient Call back at Home Phone [phone]   Caller: Patient Summary of Call: Triage Record Num: [id number] Operator: Estevan Oaks Patient Name: Angelica Wilcox Call Date & Time: 07/20/2009 10:46:36AM Patient Phone: [phone] PCP: Audrie Gallus. Ilamae Geng Patient Gender: Female PCP Fax : Patient DOB: March 12, 1946 Practice Name: [facility] Reason for Call: Hx of diverticulitis and now has same sx's. Very tender abdomen LUQ. Pain only with palpation or movement. Afebrile. Onset yesterday 2/19 and worse today. Sent to [facility]--will go to [facility] in [place]) Used: Abdominal Pain / Discomfort Recommended Outcome per Protocol: See ED Immediately Reason for Outcome: Abdominal pain that has steadily worsened over hours OR has been continuous for 3 hours or more AND any of the following: loss of appetite, vomiting starting after pain, any fever, OR unable to carry out normal activities Care Advice:  ~ Do not eat or drink anything until evaluated by provider. 07/20/2009 11:31:03AM Page 1 of 1 CA Initial call taken by: Melody Comas,  July 21, 2009 10:25 AM     Appended Document: [facility]     Clinical Lists Changes  Observations: Added new observation of PAST SURG HX: Tubal ligation Tonsillectomy/ adenoidectomy Colon screen (1997) Carotid doppler- neg (09/2002) Dexa- OP, slt worse dexa 8/10 admit [facility] 2/09 for diverticulitis- incidental gallstone and inc in alk phos gallstone 2/09 hosp at [facility] 2/11 for diverticulitis  (07/23/2009 19:56)       Past Surgical History:    Tubal ligation    Tonsillectomy/ adenoidectomy    Colon screen (4696)    Carotid doppler- neg (09/2002)    Dexa- OP, slt worse dexa 8/10    admit [facility] 2/09 for diverticulitis- incidental gallstone and inc in alk phos    gallstone 2/09    hosp at [facility] 2/11 for diverticulitis

## 2010-06-30 NOTE — Miscellaneous (Signed)
Summary: PT Orders [facility] Care Plan/[facility]  PT Orders & Care Plan/[facility]   Imported By: Lanelle Bal 09/02/2009 11:39:23  _____________________________________________________________________  External Attachment:    Type:   Image     Comment:   External Document

## 2010-06-30 NOTE — Assessment & Plan Note (Signed)
Summary: dehydration issues/problem with bowels wants to discuss/ alc   Vital Signs:  Patient profile:   65 year old female Temp:     98.2 degrees F oral Pulse rate:   80 / minute Pulse rhythm:   regular BP sitting:   98 / 62  (left arm) Cuff size:   regular  Vitals Entered By: Lowella Petties CMA (July 02, 2009 10:11 AM) CC: Discuss possible dehydration   History of Present Illness: [facility]  is very interesting and she enjoys it  working on core strengthening    has to not eat and drink to go out -- due to incontinence problems (both urine and stool)  has always done this when she goes out  now she thinks she is dehydrated now   no urine infx symptoms    has dry mouth and falling asleep and fatigue, weakness/ skin dryness and nails  easy to get dizzy  less urine production (urine is lighter than expected)  has been using pads- works better then diapar  transfers on and off the toilet are more of a problem  sometimes cannot get there in time   ? if she needs to consider an indwelling catheter in the past  does straight cath   when she is dehydrated -- her stools are harder  but loose when she is hydrating herself  occ takes immodium -- but that causes constipation   has gotten away from a fiber supplement  she is drinking more coffee lately      Allergies: 1)  Fosamax 2)  Lipitor  Past History:  Past Medical History: Last updated: 07/24/2007 multiple sclerosis Anxiety Depression GERD Headache/ migraines Hyperlipidemia Osteoporosis 2/09 diverticulitis  Past Surgical History: Last updated: 01/18/2009 Tubal ligation Tonsillectomy/ adenoidectomy Colon screen (1997) Carotid doppler- neg (09/2002) Dexa- OP, slt worse dexa 8/10 admit [facility] 2/09 for diverticulitis- incidental gallstone and inc in alk phos gallstone 2/09  Family History: Last updated: 01/17/2007 Father: cancer,  HTN, ETOH, CHF Mother: glaucome, pulmonary fibrosis Siblings: celiac disease    Social History: Last updated: 05/29/2008 Marital Status: widowed non smoker no alcohol Occupation: disabled from MS- wheelchair bound   Risk Factors: Smoking Status: quit (11/04/2006)  Review of Systems General:  Complains of fatigue; denies loss of appetite and malaise. Eyes:  Denies blurring. CV:  Denies chest pain or discomfort and palpitations. Resp:  Denies cough and wheezing. GI:  Denies abdominal pain, bloody stools, change in bowel habits, and indigestion. GU:  Complains of incontinence, nocturia, and urinary frequency; denies discharge, dysuria, hematuria, and urinary hesitancy. MS:  Complains of joint pain, muscle weakness, and stiffness. Derm:  Denies lesion(s), poor wound healing, and rash. Neuro:  Complains of tingling and weakness; denies tremors. Psych:  mood is fairly good . Endo:  Denies cold intolerance, excessive thirst, excessive urination, and heat intolerance. Heme:  Denies abnormal bruising and bleeding.  Physical Exam  General:  Well-developed,well-nourished,in no acute distress; alert,appropriate and cooperative throughout examination- wheelchair bound Head:  normocephalic, atraumatic, and no abnormalities observed.   Eyes:  vision grossly intact, pupils equal, pupils round, and pupils reactive to light.   Mouth:  MM are slightly dry Neck:  supple with full rom and no masses or thyromegally, no JVD or carotid bruit  Chest Wall:  No deformities, masses, or tenderness noted. Lungs:  Normal respiratory effort, chest expands symmetrically. Lungs are clear to auscultation, no crackles or wheezes. Heart:  Normal rate and regular rhythm. S1 and S2 normal without gallop, murmur, click, rub or other extra sounds. Abdomen:  no suprapubic tenderness or fullness felt  no masses, no guarding, no hepatomegaly, and no splenomegaly.   Msk:  no CVA tenderness  Extremities:  No clubbing,  cyanosis, edema, or deformity noted with normal full range of motion of all joints.   Neurologic:  sensation intact to light touch.   Skin:  Intact without suspicious lesions or rashes Cervical Nodes:  No lymphadenopathy noted Inguinal Nodes:  No significant adenopathy Psych:  normal affect, talkative and pleasant    Impression & Recommendations:  Problem # 1:  INCONTINENCE (ICD-788.30) Assessment New both bowel and bladder with progressive MS  suspect inc fiber and changed eating sched will help bowel prob for urine incontinence rec urol ref -- also warned against getting dehydrated/ adv to wear adult diapar in meantime as needed Orders: Urology Referral (Urology)  Complete Medication List: 1)  Neurontin 300 Mg Caps (Gabapentin) .Marland Kitchen.. 1 by mouth q am, 1 by mouth q noon and 2 by mouth q pm 2)  Zanaflex 2 Mg Tabs (Tizanidine hcl) .... Take one q am, one at noon, one pm, three q hs 3)  Lorazepam 0.5 Mg Tabs (Lorazepam) .... Take by mouth once daily prn 4)  Est Ring  5)  Vicodin 5-500 Mg Tabs (Hydrocodone-acetaminophen) .Marland Kitchen.. 1 tab  three times a day pain 6)  Vitamin D 3 2000 Iu  .... 2 by mouth once daily 7)  Lexapro 20 Mg Tabs (Escitalopram oxalate) .Marland Kitchen.. 1 by mouth once daily 8)  Miacalcin 200 Unit/ml Soln (Calcitonin (salmon)) .Marland Kitchen.. 1 spray in one nostril once daily (alternate nostrils)  Patient Instructions: 1)  start a fiber supplement every day 2)  keep up good water intake - do not dehydrate yourself  3)  we will do ref to urology at check out for incontinence  Prescriptions: MIACALCIN 200 UNIT/ML SOLN (CALCITONIN (SALMON)) 1 spray in one nostril once daily (alternate nostrils)  #1 month x 11   Entered and Authorized by:   Judith Part MD   Signed by:   Judith Part MD on 07/02/2009   Method used:   [facility]        [id number]       [address]       [place], [place]  [address]       Ph: [phone]       Fax: [phone]   RxID:    [id number]   Prior Medications (reviewed today): NEURONTIN 300 MG CAPS (GABAPENTIN) 1 by mouth q am, 1 by mouth q noon and 2 by mouth q pm ZANAFLEX 2 MG TABS (TIZANIDINE HCL) take one q am, one at noon, one pm, three q hs LORAZEPAM 0.5 MG TABS (LORAZEPAM) take by mouth once daily prn EST RING ()  VICODIN 5-500 MG TABS (HYDROCODONE-ACETAMINOPHEN) 1 tab  three times a day PAIN VITAMIN D 3  2000 IU () 2 by mouth once daily LEXAPRO 20 MG TABS (ESCITALOPRAM OXALATE) 1 by mouth once daily MIACALCIN 200 UNIT/ML SOLN (CALCITONIN (SALMON)) 1 spray in one nostril once daily (alternate nostrils) Current Allergies: FOSAMAX LIPITOR

## 2010-06-30 NOTE — Letter (Signed)
Summary: [facility]  [facility]   Imported By: Lanelle Bal 04/03/2010 09:31:34  _____________________________________________________________________  External Attachment:    Type:   Image     Comment:   External Document

## 2010-06-30 NOTE — Progress Notes (Signed)
Summary: Lexapro 20mg  refill  Phone Note Refill Request Call back at [phone] [facility] on December 23, 2009 11:56 AM  Refills Requested: Medication #1:  LEXAPRO 20 MG TABS 1 by mouth once daily [facility] requested refill on Lexapro 20mg  No refill date sent. Please advise.    Method Requested: Telephone to Pharmacy Initial call taken by: Lewanda Rife LPN,  December 23, 2009 11:57 AM  Follow-up for Phone Call        px written on EMR for call in  Follow-up by: Judith Part MD,  December 23, 2009 1:24 PM    New/Updated Medications: LEXAPRO 20 MG TABS (ESCITALOPRAM OXALATE) 1 by mouth once daily Prescriptions: LEXAPRO 20 MG TABS (ESCITALOPRAM OXALATE) 1 by mouth once daily  #30 x 11   Entered by:   Lowella Petties CMA   Authorized by:   Judith Part MD   Signed by:   Lowella Petties CMA on 12/23/2009   Method used:   [facility]        [id number]       [address]       [place], [place]  [address]       Ph: [phone]       Fax: [phone]   RxID:   [id number]

## 2010-06-30 NOTE — Assessment & Plan Note (Signed)
Summary: HOSPTIAL/REHAB FOLLOW UP/[facility] SEE BELOW   Vital Signs:  Patient profile:   65 year old female Height:      63 inches Temp:     98.2 degrees F oral Pulse rate:   76 / minute Pulse rhythm:   regular BP sitting:   136 / 78  (left arm) Cuff size:   regular  Vitals Entered By: Lewanda Rife LPN (August 25, 2009 12:41 PM) CC: follow up from hospitalization and re hab   History of Present Illness: here for hosp f/u for diverticulitis fell and hit her head  was t with cipro and flagyl  for f/u Dr Marva Panda- GI (sees a PA tomorrow)  wants to have a disc of alternative bowel regimens   stomach is better and made it through her antibiotics  cipro gave her bad gerd and some vomiting    went into skilled nursing for rehab -- 3 weeks of intensive tx  this enlightened her of some changes to make at home  bought a lift for home -- is trying it out  will also get a hospital bed that moves  can make her own sliding bathroom transfer -- using depends as needed now     PT/OT- home health care is in the house -- has talked about it -- will come back this week wants to work on getting a system to get up to her stander  goal to get some help getting dressed in ams   did see Dr Wanda Plump before her hosp  did not go as well as she wanted  given Gala Murdoch and other med to try    is constantly getting weaker from her MS- really frustrated     Allergies: 1)  ! Cipro 2)  Fosamax 3)  Lipitor  Past History:  Past Medical History: Last updated: 07/24/2007 multiple sclerosis Anxiety Depression GERD Headache/ migraines Hyperlipidemia Osteoporosis 2/09 diverticulitis  Past Surgical History: Last updated: 07/23/2009 Tubal ligation Tonsillectomy/ adenoidectomy Colon screen (1997) Carotid doppler- neg (09/2002) Dexa- OP, slt worse dexa 8/10 admit [facility] 2/09 for diverticulitis- incidental gallstone and inc in alk phos gallstone 2/09 hosp at [facility] 2/11 for diverticulitis   Family  History: Last updated: 01/17/2007 Father: cancer, HTN, ETOH, CHF Mother: glaucome, pulmonary fibrosis Siblings: celiac disease    Social History: Last updated: 05/29/2008 Marital Status: widowed non smoker no alcohol Occupation: disabled from MS- wheelchair bound   Risk Factors: Smoking Status: quit (11/04/2006)  Review of Systems General:  Complains of fatigue; denies chills, fever, loss of appetite, and malaise. Eyes:  Denies blurring and eye irritation. CV:  Denies chest pain or discomfort and shortness of breath with exertion. Resp:  Denies cough and wheezing. GI:  Complains of change in bowel habits and gas; denies abdominal pain, bloody stools, indigestion, nausea, and vomiting. GU:  Complains of incontinence and urinary frequency; denies dysuria and hematuria. MS:  Complains of muscle weakness and stiffness; denies joint pain, joint redness, and joint swelling. Derm:  Denies itching, lesion(s), poor wound healing, and rash. Neuro:  Complains of numbness and weakness; denies tingling. Psych:  mood is generally ok - remains positive. Endo:  Denies cold intolerance, excessive thirst, excessive urination, and heat intolerance. Heme:  Denies abnormal bruising and bleeding.  Physical Exam  General:  Well-developed,well-nourished,in no acute distress; alert,appropriate and cooperative throughout examination- wheelchair bound Head:  normocephalic, atraumatic, and no abnormalities observed.   Eyes:  vision grossly intact, pupils equal, pupils round, and pupils reactive to light.  Nose:  no nasal discharge.   Mouth:  pharynx pink and moist.   Neck:  supple with full rom and no masses or thyromegally, no JVD or carotid bruit  Lungs:  Normal respiratory effort, chest expands symmetrically. Lungs are clear to auscultation, no crackles or wheezes. Heart:  Normal rate and regular rhythm. S1 and S2 normal without gallop, murmur, click, rub or other extra sounds. Abdomen:  slt  overactive bs but not high pitched or tinkling  soft, non-tender, no distention, no masses, no hepatomegaly, and no splenomegaly.   Msk:  no CVA tenderness  Extremities:  No clubbing, cyanosis, edema, or deformity noted with normal full range of motion of all joints.   Neurologic:  generalized weakness from MS- has worsened  nl sensation sensation intact to light touch.   wheelchair bound  no new focal deficits  Skin:  Intact without suspicious lesions or rashes Cervical Nodes:  No lymphadenopathy noted Inguinal Nodes:  No significant adenopathy Psych:  bright affect - positive attitude/ smiling   Impression & Recommendations:  Problem # 1:  DIVERTICULITIS, COLON (ICD-562.11) Assessment Deteriorated now resolved after hosp and getting PT for deconditioning  f/u GI as planned  rev hosp reports/ labs with pt in detail  Problem # 2:  Hx of INCONTINENCE (ICD-788.30) Assessment: Unchanged will refil vesicare for as needed use - works well enough to be worth it  disc imp of good hydration even if this worsens incontinence   Problem # 3:  Hx of MULTIPLE SCLEROSIS (ICD-340) Assessment: Deteriorated worsening with time and req more help for ADLs will continue PT and home care  may need to move to facility in the future   Complete Medication List: 1)  Neurontin 300 Mg Caps (Gabapentin) .Marland Kitchen.. 1 by mouth q am, 1 by mouth at 3pm and 2 by mouth q pm 2)  Zanaflex 2 Mg Tabs (Tizanidine hcl) .... Take one q am, one at noon, one pm, three q hs 3)  Lorazepam 0.5 Mg Tabs (Lorazepam) .... Take by mouth once daily prn 4)  Est Ring  5)  Vicodin 5-500 Mg Tabs (Hydrocodone-acetaminophen) .Marland Kitchen.. 1 tab  three times a day pain as needed 6)  Vitamin D 3 2000 Iu  .... 2 by mouth once daily 7)  Lexapro 20 Mg Tabs (Escitalopram oxalate) .Marland Kitchen.. 1 by mouth once daily 8)  Miacalcin 200 Unit/ml Soln (Calcitonin (salmon)) .Marland Kitchen.. 1 spray in one nostril once daily (alternate nostrils) 9)  Vesicare 5 Mg Tabs  (Solifenacin succinate) .Marland Kitchen.. 1 by mouth once daily as needed for overactive bladder  Patient Instructions: 1)  try the vesicare as needed - I sent to your pharmacy  2)  follow up with GI as planned  3)  update me if abdominal pain or worse  Prescriptions: VESICARE 5 MG TABS (SOLIFENACIN SUCCINATE) 1 by mouth once daily as needed for overactive bladder  #30 x 3   Entered and Authorized by:   Judith Part MD   Signed by:   Judith Part MD on 08/25/2009   Method used:   [facility]        [id number]       [address]       [place], [place]  [address]       Ph: [phone]       Fax: [phone]   RxID:   [id number]   Current Allergies (reviewed today): ! CIPRO FOSAMAX LIPITOR

## 2010-06-30 NOTE — Letter (Signed)
Summary: [facility]   Imported By: Lanelle Bal 03/23/2010 08:20:51  _____________________________________________________________________  External Attachment:    Type:   Image     Comment:   External Document

## 2010-06-30 NOTE — Progress Notes (Signed)
Summary: Request increase Lexapro dosage  Phone Note Call from Patient Call back at [phone]   Caller: Patient Call For: Angelica Part MD Summary of Call: Pt said pt's psychologist, Merry Proud suggest increasing Lexapro 10mg  to the next dosage. Pt is presently taking Lexapro 10mg  take one daily. [facility] [phone]. Please advise.  Initial call taken by: Lewanda Rife LPN,  June 17, 2009 9:17 AM  Follow-up for Phone Call        will go ahead and inc to 20 update me if any side eff or worse dep keep f/u for april unless needed earlier  px written on EMR for call in  Follow-up by: Angelica Part MD,  June 17, 2009 9:33 AM  Additional Follow-up for Phone Call Additional follow up Details #1::        Advised pt, med called to [facility]. Additional Follow-up by: Lowella Petties CMA,  June 17, 2009 10:36 AM    New/Updated Medications: LEXAPRO 20 MG TABS (ESCITALOPRAM OXALATE) 1 by mouth once daily Prescriptions: LEXAPRO 20 MG TABS (ESCITALOPRAM OXALATE) 1 by mouth once daily  #30 x 5   Entered and Authorized by:   Angelica Part MD   Signed by:   Angelica Part MD on 06/17/2009   Method used:   Telephoned to ...       [facility]  [id number]ail[facility]       [address]       [place], [place]  [address]       Ph: [phone]       Fax: [phone]   RxID:   [id number]

## 2010-06-30 NOTE — Miscellaneous (Signed)
Summary: [facility] Order/[facility]  [facility] Order/[facility]   Imported By: Lanelle Bal 09/16/2009 13:16:46  _____________________________________________________________________  External Attachment:    Type:   Image     Comment:   External Document

## 2010-06-30 NOTE — Progress Notes (Signed)
Summary: refill request for lexapro  Phone Note Refill Request Message from:  [facility]  Refills Requested: Medication #1:  LEXAPRO 10 MG TABS 1 by mouth once daily Electronic request from [facility], no last filled date given.  Initial call taken by: Lowella Petties CMA,  June 02, 2009 9:23 AM    Prescriptions: LEXAPRO 10 MG TABS (ESCITALOPRAM OXALATE) 1 by mouth once daily  #30 x 0   Entered and Authorized by:   Ruthe Mannan MD   Signed by:   Ruthe Mannan MD on 06/02/2009   Method used:   Electronically to        [id number] (retail)       [address]       [place], [place]  [address]       Ph: [phone]       Fax: [phone]   RxID:   [id number]

## 2010-06-30 NOTE — Miscellaneous (Signed)
Summary: Episode Summary/[facility]  Episode Summary/[facility]   Imported By: Lanelle Bal 10/07/2009 10:52:29  _____________________________________________________________________  External Attachment:    Type:   Image     Comment:   External Document

## 2010-06-30 NOTE — Progress Notes (Signed)
Summary: prior Berkley Harvey is needed for vesicare  Phone Note From Pharmacy   [facility]  [address] [id number] Summary of Call: Prior Berkley Harvey is needed for vesicare, pt says she doesnt mind trying an alternative if effective and less costly.  Do you want me to call for prior auth form? Initial call taken by: Lowella Petties CMA,  November 26, 2009 5:11 PM  Follow-up for Phone Call        have her find out what meds for overactive bladder are covered by her insurance and update me please -- like detrol/ ditropan/ enablex- etc  Follow-up by: Judith Part MD,  November 27, 2009 7:56 AM  Additional Follow-up for Phone Call Additional follow up Details #1::        Patient notified as instructed by telephone. Pt will call back with info.Lewanda Rife LPN  November 27, 2009 8:23 AM   [facility] only gave her the name of one, trospium chloride.  If you dont want pt to try this the pt is willing to pay for the vesicare. Additional Follow-up by: Lowella Petties CMA,  November 27, 2009 12:39 PM    Additional Follow-up for Phone Call Additional follow up Details #2::    that is fine (trade name is sanctura) px written on EMR for call in  Follow-up by: Judith Part MD,  November 27, 2009 12:51 PM  Additional Follow-up for Phone Call Additional follow up Details #3:: Details for Additional Follow-up Action Taken: [facility] as instructed.  Patient notified as instructed by telephone. Lewanda Rife LPN  November 27, 2009 3:38 PM   New/Updated Medications: SANCTURA 20 MG TABS (TROSPIUM CHLORIDE) 1 by mouth two times a day Prescriptions: SANCTURA 20 MG TABS (TROSPIUM CHLORIDE) 1 by mouth two times a day  #180 x 3   Entered and Authorized by:   Judith Part MD   Signed by:   Lewanda Rife LPN on 43/32/9518   Method used:   Telephoned to ...       [facility]  [id number] (retail)       [address]       [place], [place]  [address]       Ph: [phone]       Fax: [phone]  RxID:   [id number]

## 2010-06-30 NOTE — Letter (Signed)
[facility]  [facility]   Imported By: Lanelle Bal 07/30/2009 10:51:18  _____________________________________________________________________  External Attachment:    Type:   Image     Comment:   External Document

## 2010-06-30 NOTE — Letter (Signed)
Summary: [facility]rology [facility]  [facility]   Imported By: Maryln Gottron 04/24/2010 10:24:17  _____________________________________________________________________  External Attachment:    Type:   Image     Comment:   External Document

## 2010-06-30 NOTE — Letter (Signed)
Summary: [facility]  [facility]   Imported By: Lanelle Bal 09/16/2009 13:23:45  _____________________________________________________________________  External Attachment:    Type:   Image     Comment:   External Document

## 2010-06-30 NOTE — Progress Notes (Signed)
Summary: pt is stopping sanctura  Phone Note Call from Patient Call back at Home Phone [phone]   Caller: Patient Summary of Call: Pt is not tolerating the sanctura- this is not controlling her bladder at atll, makes her feel foggy.  She is going to stop this and wants the vesicare called back in to [facility]. Initial call taken by: Lowella Petties CMA,  December 04, 2009 4:02 PM  Follow-up for Phone Call        ok -- will do vesicare - may have to pay more for it if her ins does not cover go ahead and call for the prior auth- thanks  px written on EMR for call in  Follow-up by: Judith Part MD,  December 04, 2009 4:06 PM  Additional Follow-up for Phone Call Additional follow up Details #1::        [facility] as instructed. Patient notified as instructed by telephone. Pt said she had just gotten off the phone with the insurance co and was given this # to call for prior authorization [phone]. I told pt I would let Jacki Cones know and see if we should call this # or wait for form from pharmacy. Pt said she appreciated our help. Phone note forward to [place].Lewanda Rife LPN  December 04, 3084 4:29 PM     Additional Follow-up for Phone Call Additional follow up Details #2::    Prior auth form for vesicare is on your shelf.                Lowella Petties CMA  December 05, 2009 9:27 AM   thanks - done and in IN box Follow-up by: Judith Part MD,  December 05, 2009 1:14 PM  Additional Follow-up for Phone Call Additional follow up Details #3:: Details for Additional Follow-up Action Taken: Form faxed.                   Lowella Petties CMA  December 05, 2009 2:33 PM   New/Updated Medications: VESICARE 5 MG TABS (SOLIFENACIN SUCCINATE) 1 by mouth once daily Prescriptions: VESICARE 5 MG TABS (SOLIFENACIN SUCCINATE) 1 by mouth once daily  #30 x 11   Entered and Authorized by:   Judith Part MD   Signed by:   Lewanda Rife LPN on 57/84/6962   Method used:   Telephoned  to ...       [facility]  [id number]* (retail)       [address]       [place], [place]  [address]       Ph: [phone]       Fax: [phone]   RxID:   [id number]

## 2010-06-30 NOTE — Letter (Signed)
Summary: [facility]  [facility]   Imported By: Lanelle Bal 07/28/2009 13:36:55  _____________________________________________________________________  External Attachment:    Type:   Image     Comment:   External Document

## 2010-06-30 NOTE — Consult Note (Signed)
Summary: [facility]   Imported By: Sherian Rein 02/11/2010 10:03:06  _____________________________________________________________________  External Attachment:    Type:   Image     Comment:   External Document

## 2010-06-30 NOTE — Medication Information (Signed)
Summary: Prior Authorization & Approval for [facility]icare/[facility]  Prior Authorization & Approval for [facility]icare/[facility]   Imported By: Lanelle Bal 12/11/2009 08:27:23  _____________________________________________________________________  External Attachment:    Type:   Image     Comment:   External Document

## 2010-07-02 NOTE — Miscellaneous (Signed)
Summary: flu vaccine   Clinical Lists Changes  Observations: Added new observation of FLU VAX: [facility] (04/06/2010 9:41)      Immunization History:  Influenza Immunization History:    Influenza:  historical received at [facility] (04/06/2010)

## 2010-07-02 NOTE — Letter (Signed)
Summary: [facility] Care-Physical Medicine & Rehab  [facility]   Imported By: Maryln Gottron 06/22/2010 14:39:45  _____________________________________________________________________  External Attachment:    Type:   Image     Comment:   External Document

## 2010-07-03 ENCOUNTER — Encounter: Payer: Self-pay | Admitting: Family Medicine

## 2010-07-03 ENCOUNTER — Ambulatory Visit (INDEPENDENT_AMBULATORY_CARE_PROVIDER_SITE_OTHER): Payer: Medicare PPO | Admitting: Family Medicine

## 2010-07-03 DIAGNOSIS — R404 Transient alteration of awareness: Secondary | ICD-10-CM | POA: Insufficient documentation

## 2010-07-03 DIAGNOSIS — R3919 Other difficulties with micturition: Secondary | ICD-10-CM

## 2010-07-03 DIAGNOSIS — M24549 Contracture, unspecified hand: Secondary | ICD-10-CM

## 2010-07-06 ENCOUNTER — Ambulatory Visit (INDEPENDENT_AMBULATORY_CARE_PROVIDER_SITE_OTHER): Payer: Medicare PPO | Admitting: Psychiatry

## 2010-07-06 DIAGNOSIS — F0630 Mood disorder due to known physiological condition, unspecified: Secondary | ICD-10-CM

## 2010-07-08 DIAGNOSIS — R5383 Other fatigue: Secondary | ICD-10-CM

## 2010-07-09 ENCOUNTER — Encounter: Payer: Self-pay | Admitting: Family Medicine

## 2010-07-14 ENCOUNTER — Telehealth: Payer: Self-pay | Admitting: Family Medicine

## 2010-07-16 NOTE — Assessment & Plan Note (Signed)
Summary: VERY TIRED,ODOR IN URINE/CLE   Vital Signs:  Patient profile:   65 year old female Height:      63 inches Temp:     98 degrees F oral Pulse rate:   72 / minute Pulse rhythm:   regular BP sitting:   158 / 84  (left arm) Cuff size:   regular  Vitals Entered By: Lewanda Rife LPN (July 03, 2010 11:26 AM) CC: Odor in urine, itching after bed bath and at other times also, discoloration of rt hand for 1 week.(pt had Botox injection to limber up rt hand 3 weeks ago.Pt is very sleepy all the time even when she gets good night sleep. Ocassional fatigue.   History of Present Illness: has some odor to urine -- needs to give sample -- has to take a collection recepticle home a sweet and garlicy smell  some itching after bathing - sponge baths while lying or sitting  using rinseless soap - that may be the problem  some itching on her both arms and leg  feels like bug bites and cannot see rinse   R hand /arm is getting botox injection for contracture (last was a month ago)  for 1 week -- it looks different - ashy and blue  she did not call her botox provider about this  the botox was not a success to begin with    sleepy all the time even when enough sleep  sleeps well at night -- goes to bed at 1 am  sometimes needs 3 hour nap   has not been taking the zanaflex and neurontin less often  using hydrocodone a little bit more       she switched her neurologist to [place]   Allergies: 1)  ! Cipro 2)  Fosamax 3)  Lipitor  Past History:  Past Medical History: multiple sclerosis Anxiety Depression GERD Headache/ migraines Hyperlipidemia Osteoporosis 2/09 diverticulitis   neurology -- [facility]   Review of Systems General:  Complains of fatigue; denies loss of appetite and malaise. Eyes:  Denies blurring and eye irritation. CV:  Denies chest pain or discomfort, lightheadness, palpitations, and shortness of breath with exertion. Resp:  Denies cough and  shortness of breath. GI:  Denies abdominal pain and change in bowel habits. GU:  Complains of incontinence and urinary frequency; denies hematuria. MS:  Complains of joint pain; denies joint swelling. Derm:  Denies itching, lesion(s), poor wound healing, and rash. Neuro:  Denies headaches, numbness, and tingling. Psych:  mood is generally stable . Endo:  Denies cold intolerance, excessive thirst, excessive urination, and heat intolerance. Heme:  Denies abnormal bruising and bleeding.  Physical Exam  General:  Well-developed,well-nourished,in no acute distress; alert,appropriate and cooperative throughout examination- wheelchair bound Head:  normocephalic, atraumatic, and no abnormalities observed.   Eyes:  vision grossly intact, pupils equal, pupils round, and pupils reactive to light.   Nose:  no nasal discharge.   Mouth:  pharynx pink and moist.   Neck:  supple with full rom and no masses or thyromegally, no JVD or carotid bruit  Chest Wall:  No deformities, masses, or tenderness noted. Lungs:  Normal respiratory effort, chest expands symmetrically. Lungs are clear to auscultation, no crackles or wheezes. Heart:  Normal rate and regular rhythm. S1 and S2 normal without gallop, murmur, click, rub or other extra sounds. Abdomen:  no suprapubic tenderness or fullness felt  Msk:  no CVA tenderness  Pulses:  R and L carotid,radial,femoral,dorsalis pedis and posterior  tibial pulses are full and equal bilaterally Extremities:  R hand has dusky color - however it is warm and well perfused with nl radial and ulnar pulses  hand is overall less contracted  Neurologic:  generalized weakness from MS- has worsened  nl sensation sensation intact to light touch.   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 1 by mouth q am, 1 by mouth at 3pm and 2 by mouth q pm 2)  Zanaflex 2 Mg Tabs (Tizanidine hcl) .... Take one q am, one at noon, one pm, three q hs 3)  Lorazepam 0.5 Mg Tabs (Lorazepam) .... Take by mouth once daily prn 4)  Est Ring  5)  Vicodin 5-500 Mg Tabs (Hydrocodone-acetaminophen) .Marland Kitchen.. 1 tab  three times a day pain as needed 6)  Vitamin D 3 2000 Iu  .... 2 by mouth once daily 7)  Lexapro 20 Mg Tabs (Escitalopram oxalate) .Marland Kitchen.. 1 by mouth once daily 8)  Miacalcin 200 Unit/ml Soln (Calcitonin (salmon)) .Marland Kitchen.. 1 spray in one nostril once daily (alternate nostrils) 9)  Vesicare 5 Mg Tabs (Solifenacin succinate) .Marland Kitchen.. 1 by mouth once daily 10)  Botox ? Solution Strength   Patient Instructions: 1)  for bathing - dove for sensitive skin and rinse it off 2)  not in vulvar area - that is water only  3)  bring back a urine sample please  4)  I think you have long days and with current medicines this may make you tired- a nap is not unreasonable ulness you are not sleeping at night  5)  call your botox /neurology specialist about the discoloration in hand    Orders Added: 1)  Est. Patient Level IV [96295]    Current Allergies (reviewed today): ! CIPRO FOSAMAX LIPITOR  Appended Document: VERY TIRED,ODOR IN  URINE/CLE Medications Added BACTRIM DS 800-160 MG TABS (SULFAMETHOXAZOLE-TRIMETHOPRIM) 1 by mouth two times a day for 7 days          Clinical Lists Changes  Medications: Added new medication of BACTRIM DS 800-160 MG TABS (SULFAMETHOXAZOLE-TRIMETHOPRIM) 1 by mouth two times a day for 7 days - Signed Rx of BACTRIM DS 800-160 MG TABS (SULFAMETHOXAZOLE-TRIMETHOPRIM) 1 by mouth two times a day for 7 days;  #14 x 0;  Signed;  Entered by: Judith Part MD;  Authorized by: Judith Part MD;  Method used: [facility]  [id number] [address], [place], [place]  [address], Ph: [phone], Fax: [phone] Orders: Added new Test order of T-Culture, Urine ([id number]) - Signed Added new Service order  of Specimen Handling ([address]) - Signed Added new Service order of UA Dipstick W/ Micro (manual) (81000) - Signed Observations: Added new observation of UR COMMENTS: 0  (07/08/2010 13:05) Added new observation of UA YEAST: 0  (07/08/2010 13:05) Added new observation of CASTS URINE: 0 /lpf (07/08/2010 13:05) Added new observation of URINE CRYSTA: 0 /hpf (07/08/2010 13:05) Added new observation of EPI CELL UR: 1-3 /lpf (07/08/2010 13:05) Added new observation of MUCUS URINE: few  (07/08/2010 13:05) Added new observation of BACTERIA URN: many  (07/08/2010 13:05) Added new observation of RBCS MICRO U: 2-3  (07/08/2010 13:05) Added new observation of WBCS MICRO U: many cells/hpf (07/08/2010 13:05) Added new observation of PH URINE: 6.0  (07/08/2010 13:05) Added new observation of SPEC GR URIN: 1.015  (07/08/2010 13:05) Added new observation of APPEARANCE U: Cloudy  (07/08/2010 13:05) Added new observation of UA COLOR: straw  (07/08/2010 13:05) Added new observation of WBC DIPSTK U: trace  (07/08/2010 13:05) Added new observation of NITRITE URN: negative  (07/08/2010 13:05) Added new observation of UROBILINOGEN: 0.2  (07/08/2010 13:05) Added new observation of PROTEIN, URN:  trace  (07/08/2010 13:05) Added new observation of BLOOD UR DIP: trace-lysed  (07/08/2010 13:05) Added new observation of KETONES URN: negative  (07/08/2010 13:05) Added new observation of BILIRUBIN UR: negative  (07/08/2010 13:05) Added new observation of GLUCOSE, URN: negative  (07/08/2010 13:05)    Prescriptions: BACTRIM DS 800-160 MG TABS (SULFAMETHOXAZOLE-TRIMETHOPRIM) 1 by mouth two times a day for 7 days  #14 x 0   Entered and Authorized by:   Judith Part MD   Signed by:   Judith Part MD on 07/08/2010   Method used:   Telephoned to ...       [facility]  [address] (retail)       [address]       [place], [place]  [address]       Ph: [phone]       Fax: [phone]   RxID:   [id number]    Laboratory Results   Urine Tests  Date/Time Received: July 08, 2010 1:05 PM  Date/Time Reported: July 08, 2010 1:05 PM   Routine Urinalysis   Color: straw Appearance: Cloudy Glucose: negative   (Normal Range: Negative) Bilirubin: negative   (Normal Range: Negative) Ketone: negative   (Normal Range: Negative) Spec. Gravity: 1.015   (Normal Range: 1.003-1.035) Blood: trace-lysed   (Normal Range: Negative) pH: 6.0   (Normal Range: 5.0-8.0) Protein: trace   (Normal Range: Negative) Urobilinogen: 0.2   (Normal Range: 0-1) Nitrite: negative   (Normal Range: Negative) Leukocyte Esterace: trace   (Normal Range: Negative)  Urine Microscopic WBC/HPF: many RBC/HPF: 2-3 Bacteria/HPF: many Mucous/HPF: few Epithelial/HPF: 1-3 Crystals/HPF: 0 Casts/LPF: 0 Yeast/HPF: 0 Other: 0       Pt dropped off urine due to odor in urine.Lewanda Rife LPN  July 08, 2010 1:07 PM   please let her know urine looks infected  call in bactrim DS send for cx  then update  Patient notified as instructed by telephone. [facility] as instructed. Urine culture was sent to lab.Lewanda Rife LPN  July 08, 2010 4:37 PM

## 2010-07-20 ENCOUNTER — Ambulatory Visit: Payer: Medicare PPO | Admitting: Psychiatry

## 2010-07-22 NOTE — Progress Notes (Signed)
Summary: regarding septra  Phone Note Call from Patient   Caller: Patient Call For: Judith Part MD Summary of Call: Pt has been taking septra for a UTI, since saturday.  She has been doing ok with this but vomited x one after taking it yesterday- vomited last night.  I advised her to try one more time and if any more vomiting to call back.  Pt agreed.               Lowella Petties CMA, AAMA  July 14, 2010 10:27 AM   Follow-up for Phone Call        I looked back and cx was clear- so she can stop the abx Follow-up by: Judith Part MD,  July 14, 2010 1:53 PM  Additional Follow-up for Phone Call Additional follow up Details #1::        Patient notified as instructed by telephone. Lewanda Rife LPN  July 14, 2010 2:59 PM

## 2010-07-27 ENCOUNTER — Ambulatory Visit: Payer: Medicare PPO | Admitting: Psychiatry

## 2010-08-03 ENCOUNTER — Ambulatory Visit (INDEPENDENT_AMBULATORY_CARE_PROVIDER_SITE_OTHER): Payer: Medicare PPO | Admitting: Psychiatry

## 2010-08-03 DIAGNOSIS — F0630 Mood disorder due to known physiological condition, unspecified: Secondary | ICD-10-CM

## 2010-08-07 ENCOUNTER — Telehealth: Payer: Self-pay | Admitting: Family Medicine

## 2010-08-07 ENCOUNTER — Encounter: Payer: Self-pay | Admitting: Family Medicine

## 2010-08-10 ENCOUNTER — Encounter: Payer: Self-pay | Admitting: Family Medicine

## 2010-08-17 ENCOUNTER — Encounter: Payer: Self-pay | Admitting: Family Medicine

## 2010-08-17 ENCOUNTER — Ambulatory Visit (INDEPENDENT_AMBULATORY_CARE_PROVIDER_SITE_OTHER): Payer: Medicare PPO | Admitting: Psychiatry

## 2010-08-17 DIAGNOSIS — F0630 Mood disorder due to known physiological condition, unspecified: Secondary | ICD-10-CM

## 2010-08-18 NOTE — Progress Notes (Signed)
Summary: needs order for wheelchair repair  Phone Note Call from Patient Call back at Home Phone [phone] P PH     Caller: Patient Call For: Judith Part MD Summary of Call: Pt needs repairs done to her wheelchair and will need an order for those repairs, nothing specific- just wheelchair repair- wont tilt or recline- faxed to a company in [place], Neurosurgeon, fax number [phone],  Ayesha Rumpf.  Also, she needs a form for a handicapped placard signed and is asking, if she brings this in, can she stay and wait for it.  I  told her yes, but I cant say how long she might have to wait, advised her to come in tues or wed from around 1:30-2:00. Initial call taken by: Lowella Petties CMA, AAMA,  August 07, 2010 3:53 PM  Follow-up for Phone Call        form done and in nurse in box  Follow-up by: Judith Part MD,  August 10, 2010 8:48 AM  Additional Follow-up for Phone Call Additional follow up Details #1::        W/C repair order faxed to [phone] to Pam's attention. Left message for patient to call back about handicap placard form being ready.Handicap form left at front desk. Lewanda Rife LPN  August 10, 2010 2:24 PM   Patient notified as instructed by telephone. Lewanda Rife LPN  August 10, 2010 4:51 PM     New/Updated Medications: [facility] REPAIR please repair wheelchair for pt with MS who is nonambulatory1 Prescriptions: WHEELCHAIR REPAIR please repair wheelchair for pt with MS who is nonambulatory1  #1 x 0   Entered and Authorized by:   Judith Part MD   Signed by:   Judith Part MD on 08/10/2010   Method used:   Print then Give to Patient   RxID:   [id number]

## 2010-08-18 NOTE — Letter (Signed)
Summary: Application for Handicapped Placard  Application for Handicapped Placard   Imported By: Maryln Gottron 08/14/2010 15:10:12  _____________________________________________________________________  External Attachment:    Type:   Image     Comment:   External Document

## 2010-08-18 NOTE — Medication Information (Signed)
Summary: Rx for Wheelchair Repair  Rx for Wheelchair Repair   Imported By: Beau Fanny 08/11/2010 10:36:08  _____________________________________________________________________  External Attachment:    Type:   Image     Comment:   External Document

## 2010-08-18 NOTE — Letter (Signed)
Summary: [facility]   Imported By: Kassie Mends 08/11/2010 10:06:03  _____________________________________________________________________  External Attachment:    Type:   Image     Comment:   External Document

## 2010-08-24 ENCOUNTER — Ambulatory Visit: Payer: Medicare PPO | Admitting: Psychiatry

## 2010-08-27 NOTE — Letter (Signed)
Summary: Handicapped Placard  Handicapped Placard   Imported By: Kassie Mends 08/17/2010 09:32:38  _____________________________________________________________________  External Attachment:    Type:   Image     Comment:   External Document

## 2010-08-31 ENCOUNTER — Ambulatory Visit (INDEPENDENT_AMBULATORY_CARE_PROVIDER_SITE_OTHER): Payer: Medicare PPO | Admitting: Psychiatry

## 2010-08-31 DIAGNOSIS — F0630 Mood disorder due to known physiological condition, unspecified: Secondary | ICD-10-CM

## 2010-09-07 ENCOUNTER — Ambulatory Visit: Payer: Medicare PPO | Admitting: Psychiatry

## 2010-09-21 ENCOUNTER — Ambulatory Visit (INDEPENDENT_AMBULATORY_CARE_PROVIDER_SITE_OTHER): Payer: Medicare PPO | Admitting: Psychiatry

## 2010-09-21 DIAGNOSIS — F0630 Mood disorder due to known physiological condition, unspecified: Secondary | ICD-10-CM

## 2010-10-01 ENCOUNTER — Encounter: Payer: Self-pay | Admitting: Family Medicine

## 2010-10-02 ENCOUNTER — Ambulatory Visit (INDEPENDENT_AMBULATORY_CARE_PROVIDER_SITE_OTHER): Payer: Medicare PPO | Admitting: Family Medicine

## 2010-10-02 ENCOUNTER — Encounter: Payer: Self-pay | Admitting: Family Medicine

## 2010-10-02 VITALS — BP 158/86 | HR 80 | Temp 98.0°F

## 2010-10-02 DIAGNOSIS — I10 Essential (primary) hypertension: Secondary | ICD-10-CM

## 2010-10-02 DIAGNOSIS — G35 Multiple sclerosis: Secondary | ICD-10-CM

## 2010-10-02 MED ORDER — LISINOPRIL 10 MG PO TABS: 10.0000 mg | ORAL_TABLET | Freq: Every day | ORAL | Status: DC

## 2010-10-02 NOTE — Progress Notes (Signed)
  Subjective:    Patient ID: Angelica Wilcox, female    DOB: April 03, 1946, 65 y.o.   MRN: [id number]  HPI Is here for elevated bp  Has hx of labile blood pressure   Is starting to stay high now   Is doing PT at [facility]  Is in neuroselective exercise program Has been high for several visits in a row - she is worried about that  190/110   158/86  In the past 6 months eating habits have changed -as a part of a bowel program  Is difficult because she goes out a lot  Is now on miralax - no longer citurcel  In general eating badly again   Legs and feet have been swelling more in therapy Is wearing support hose      Review of Systems Review of Systems  Constitutional: Negative for fever, appetite change, fatigue and unexpected weight change.  Eyes: Negative for pain and visual disturbance.  Respiratory: Negative for cough and shortness of breath.   Cardiovascular: Negative for sob or cp , pos for some mild ankle edema  Gastrointestinal: Negative for nausea, diarrhea and constipation.  Genitourinary: Negative for urgency and frequency.  Skin: Negative for pallor.  MSK pos for diffuse weakness which is baseline Neurological: Negative for , light-headedness, numbness and headaches.  Hematological: Negative for adenopathy. Does not bruise/bleed easily.  Psychiatric/Behavioral: Negative for dysphoric mood. The patient is not nervous/anxious.          Objective:   Physical Exam  Constitutional: She appears well-developed and well-nourished. No distress.       In automated wheelchair baseline No distress   HENT:  Head: Normocephalic and atraumatic.  Eyes: Conjunctivae and EOM are normal. Pupils are equal, round, and reactive to light.  Neck: Normal range of motion. Neck supple. No JVD present. Carotid bruit is not present. No thyromegaly present.  Cardiovascular: Normal rate, regular rhythm and normal heart sounds.   No murmur heard. Pulmonary/Chest: Effort normal and  breath sounds normal. No respiratory distress. She has no wheezes. She exhibits no tenderness.  Abdominal: She exhibits no abdominal bruit. There is no tenderness.  Musculoskeletal: She exhibits no tenderness.       Mild nonpitting ankle edema   Lymphadenopathy:    She has no cervical adenopathy.  Neurological: No cranial nerve deficit. She exhibits abnormal muscle tone.  Skin: Skin is warm and dry. No rash noted. No erythema. No pallor.  Psychiatric: She has a normal mood and affect.          Assessment & Plan:

## 2010-10-02 NOTE — Patient Instructions (Signed)
Start lisinopril 10 mg each am  If any problems or side effects- alert me  Check bp daily for first 1-2 wk Then several times per week  Follow up with me in about 6 weeks Avoid sodium in diet as much as you can

## 2010-10-02 NOTE — Assessment & Plan Note (Signed)
Labile in the past now remains high Asymptomatic  Start lisinopril 10  Disc s/s low bp and other poss side eff like cough Disc low sodium diet Keep up the exercise with PT F/u in 4-6 weeks

## 2010-10-04 NOTE — Assessment & Plan Note (Signed)
Pt getting good support with PT from [facility] Will  Continue with this  Trying to remain as independent for as long as possible

## 2010-10-05 ENCOUNTER — Other Ambulatory Visit: Payer: Self-pay | Admitting: *Deleted

## 2010-10-05 ENCOUNTER — Ambulatory Visit: Payer: Medicare PPO | Admitting: Psychiatry

## 2010-10-05 MED ORDER — LORAZEPAM 0.5 MG PO TABS: 0.5000 mg | ORAL_TABLET | Freq: Every day | ORAL | Status: DC | PRN

## 2010-10-05 NOTE — Telephone Encounter (Signed)
Px written for call in   

## 2010-10-05 NOTE — Telephone Encounter (Signed)
[facility]  

## 2010-10-12 ENCOUNTER — Ambulatory Visit: Payer: Medicare PPO | Admitting: Family Medicine

## 2010-11-16 ENCOUNTER — Ambulatory Visit (INDEPENDENT_AMBULATORY_CARE_PROVIDER_SITE_OTHER): Payer: Medicare PPO | Admitting: Family Medicine

## 2010-11-16 ENCOUNTER — Encounter: Payer: Self-pay | Admitting: Family Medicine

## 2010-11-16 VITALS — BP 135/80 | HR 80 | Temp 98.1°F

## 2010-11-16 DIAGNOSIS — I10 Essential (primary) hypertension: Secondary | ICD-10-CM

## 2010-11-16 MED ORDER — LISINOPRIL 20 MG PO TABS: 20.0000 mg | ORAL_TABLET | Freq: Every day | ORAL | Status: DC

## 2010-11-16 NOTE — Patient Instructions (Signed)
We will increase your lisinopril to total of 20 mg once daily  Watch the salt in your diet  Stay as active as you can  If any side effects - let me know  Follow up in about 2 months  Keep an eye on your blood pressure  I sent px to your pharmacy

## 2010-11-16 NOTE — Assessment & Plan Note (Signed)
Improved but not resolved with 10  Of lisinopril Will increase it to 20 mg daily Update if side eff  Disc lifestyle change MS continues to progress F/u 2 mo
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
## 2010-12-01 ENCOUNTER — Ambulatory Visit: Payer: Medicare PPO | Admitting: Psychiatry

## 2010-12-09 ENCOUNTER — Ambulatory Visit (INDEPENDENT_AMBULATORY_CARE_PROVIDER_SITE_OTHER): Payer: Medicare PPO | Admitting: Psychiatry

## 2010-12-09 DIAGNOSIS — F0630 Mood disorder due to known physiological condition, unspecified: Secondary | ICD-10-CM

## 2010-12-10 ENCOUNTER — Other Ambulatory Visit: Payer: Self-pay | Admitting: Family Medicine

## 2010-12-15 ENCOUNTER — Telehealth: Payer: Self-pay | Admitting: *Deleted

## 2010-12-15 NOTE — Telephone Encounter (Signed)
Patient notified as instructed by telephone. 

## 2010-12-15 NOTE — Telephone Encounter (Signed)
Pt is having a problem with sleepiness during the day and she says the only thing that is new to her is the lisinopril that she is taking.  She takes this in the morning and asks if ok to start taking in the evening.  Please advise.

## 2010-12-15 NOTE — Telephone Encounter (Signed)
Go ahead and try it in the evening - let me know if not improved

## 2010-12-16 ENCOUNTER — Telehealth: Payer: Self-pay | Admitting: *Deleted

## 2010-12-16 NOTE — Telephone Encounter (Signed)
Done in IN box 

## 2010-12-16 NOTE — Telephone Encounter (Signed)
Patient notified as instructed by telephone.Completed form left at front desk.  

## 2010-12-16 NOTE — Telephone Encounter (Signed)
Pt has brought in form for handicapped placard, this is on your shelf.

## 2011-01-05 ENCOUNTER — Other Ambulatory Visit: Payer: Self-pay | Admitting: *Deleted

## 2011-01-05 MED ORDER — ESCITALOPRAM OXALATE 20 MG PO TABS: 20.0000 mg | ORAL_TABLET | Freq: Every day | ORAL | Status: DC

## 2011-01-05 NOTE — Telephone Encounter (Signed)
Px written for call in   

## 2011-01-06 ENCOUNTER — Ambulatory Visit (INDEPENDENT_AMBULATORY_CARE_PROVIDER_SITE_OTHER): Payer: Medicare PPO | Admitting: Psychiatry

## 2011-01-06 DIAGNOSIS — F0630 Mood disorder due to known physiological condition, unspecified: Secondary | ICD-10-CM

## 2011-01-27 ENCOUNTER — Encounter: Payer: Self-pay | Admitting: Family Medicine

## 2011-01-27 ENCOUNTER — Ambulatory Visit (INDEPENDENT_AMBULATORY_CARE_PROVIDER_SITE_OTHER): Payer: Medicare PPO | Admitting: Family Medicine

## 2011-01-27 DIAGNOSIS — I10 Essential (primary) hypertension: Secondary | ICD-10-CM

## 2011-01-27 DIAGNOSIS — G35 Multiple sclerosis: Secondary | ICD-10-CM

## 2011-01-27 DIAGNOSIS — E559 Vitamin D deficiency, unspecified: Secondary | ICD-10-CM

## 2011-01-27 LAB — COMPREHENSIVE METABOLIC PANEL
Albumin: 4.2 g/dL (ref 3.5–5.2)
BUN: 12 mg/dL (ref 6–23)
CO2: 27 mEq/L (ref 19–32)
GFR: 100.96 mL/min (ref >60.00)
Glucose, Bld: 122 mg/dL — ABNORMAL HIGH (ref 70–99)
Potassium: 3.5 mEq/L (ref 3.5–5.1)
Sodium: 138 mEq/L (ref 135–145)
Total Bilirubin: 0.4 mg/dL (ref 0.3–1.2)
Total Protein: 7 g/dL (ref 6.0–8.3)

## 2011-01-27 LAB — CBC WITH DIFFERENTIAL/PLATELET
Basophils Relative: 0.6 % (ref 0.0–3.0)
Eosinophils Relative: 1.3 % (ref 0.0–5.0)
HCT: 42.7 % (ref 36.0–46.0)
Lymphs Abs: 2.6 10*3/uL (ref 0.7–4.0)
MCV: 89.7 fl (ref 78.0–100.0)
Monocytes Absolute: 0.4 10*3/uL (ref 0.1–1.0)
Monocytes Relative: 5.3 % (ref 3.0–12.0)
Platelets: 310 10*3/uL (ref 150.0–400.0)
RBC: 4.76 Mil/uL (ref 3.87–5.11)
WBC: 7.6 10*3/uL (ref 4.5–10.5)

## 2011-01-27 LAB — TSH: TSH: 0.57 u[IU]/mL (ref 0.35–5.50)

## 2011-01-27 NOTE — Assessment & Plan Note (Signed)
Improved with 20 of lisinopril  Will watch for cough or other side effects  Disc healthy diet and staying active Lab today F/u in 6 months

## 2011-01-27 NOTE — Assessment & Plan Note (Signed)
Pt will f/u with her neurologist about MRI findings from a year ago and cerebellar findings (? Rel to meds)  Also about swallowing difficulties  Will try the paleo diet in study for MS (fruit / veg/lean meats/ some grains) -and update with response Suspect this will be healthy for her also

## 2011-01-27 NOTE — Patient Instructions (Signed)
I think the paleo diet would be safe to try with a dietician's supervision Blood pressure is good Keep up the exercise and therapy the best you can Lab today  Follow up in 6 months

## 2011-01-27 NOTE — Assessment & Plan Note (Signed)
Will check this with lab today Remains on same current dose of 2000 iu daily

## 2011-01-27 NOTE — Progress Notes (Signed)
Subjective:    Patient ID: Angelica Wilcox, female    DOB: 06/06/1945, 65 y.o.   MRN: [id number]  HPI Here for f/u of HTN  Last visit inc her lisinopril from 10 to 20mg  daily  First bp today is 140/72  No cough but occ has a little trouble swallowing - will ask neurologist if this is MS related   She thinks blood pressure medicine is working  117-120s/ 70s for the most part  She works hard - PT - now has a Pharmacologist living with her   Sees neurologist at [facility]  Had some confusion about getting a CT of neck vs a brain scan  Did have a brain scan as part of a study  He reviewed it-- from a year ago  "flair lesions"  Also some atrophy - especially in cerebellar area -- (? If that can be related to anti seizure meds or alcohol)  Has had more memory problems ? As to whether this would be rel to MS   Wants to go ahead with study using exercise (bike) and paleo diet (fruit/ veg/ meats mainly lean/ nuts and seeds)  Will have dietician working with her  There was a case study showing good results with MS   Patient Active Problem List  Diagnoses  . VITAMIN B12 DEFICIENCY  . UNSPECIFIED VITAMIN D DEFICIENCY  . HYPERLIPIDEMIA  . ANXIETY DEPRESSION  . MULTIPLE SCLEROSIS  . GERD  . DIVERTICULITIS, COLON  . CONSTIPATION, CHRONIC  . CHOLELITHIASIS, ASYMPTOMATIC  . PROLAPSE, VAGINAL WALL, RECTOCELE  . OSTEOPOROSIS  . FATIGUE  . HEADACHE  . INCONTINENCE  . NONSPEC ELEVATION OF LEVELS OF TRANSAMINASE/LDH  . CONTRACTURE OF HAND JOINT  . SOMNOLENCE  . OTHER ABNORMALITY OF URINATION  . Hypertension   Past Medical History  Diagnosis Date  . Dysthymic disorder   . Calculus of gallbladder without mention of cholecystitis or obstruction   . Other constipation   . Contracture of hand joint   . Diverticulitis of colon (without mention of hemorrhage)   . Other malaise and fatigue   . Esophageal reflux   . Headache   . Other and unspecified hyperlipidemia   . Unspecified urinary  incontinence   . Unspecified essential hypertension   . Multiple sclerosis   . Nonspecific elevation of levels of transaminase or lactic acid dehydrogenase (LDH)   . Osteoporosis, unspecified   . Other abnormality of urination   . Rectocele   . Other alteration of consciousness   . Unspecified vitamin D deficiency   . Other B-complex deficiencies    Past Surgical History  Procedure Date  . Tubal ligation   . Tonsillectomy and adenoidectomy   . Colon screen 1997  . Carotid doppler 09/2002    Negative  . Dexa 12/2008    OP, slt worse   History  Substance Use Topics  . Smoking status: Former Smoker    Quit date: 05/31/1976  . Smokeless tobacco: Never Used  . Alcohol Use: Yes     rarely   Family History  Problem Relation Age of Onset  . Cancer Father   . Hypertension Father   . Alcohol abuse Father   . Heart failure Father     CHF  . Glaucoma Mother   . Pulmonary fibrosis Mother   . Celiac disease      sibling   Allergies  Allergen Reactions  . Alendronate Sodium     REACTION: GI  . Atorvastatin     REACTION:  myalgia  . Ciprofloxacin     REACTION: vomiting   Current Outpatient Prescriptions on File Prior to Visit  Medication Sig Dispense Refill  . baclofen (LIORESAL) 10 MG tablet Take 5-10 mg by mouth 3 (three) times daily.        Marland Kitchen escitalopram (LEXAPRO) 20 MG tablet Take 1 tablet (20 mg total) by mouth daily.  30 tablet  11  . gabapentin (NEURONTIN) 300 MG capsule Take 300 mg by mouth as directed. 1 every morning, 1 at 3pm, and 2 at night       . HYDROcodone-acetaminophen (VICODIN) 5-500 MG per tablet Take 1 tablet by mouth every 8 (eight) hours as needed.        Marland Kitchen lisinopril (PRINIVIL,ZESTRIL) 20 MG tablet Take 1 tablet (20 mg total) by mouth daily.  30 tablet  11  . LORazepam (ATIVAN) 0.5 MG tablet Take 1 tablet (0.5 mg total) by mouth daily as needed.  30 tablet  3  . VESICARE 5 MG tablet TAKE 1 TABLET BY MOUTH EVERY DAY  30 tablet  6  . calcitonin, salmon,  (MIACALCIN/FORTICAL) 200 UNIT/ACT nasal spray 1 spray by Nasal route daily.        . Cholecalciferol (VITAMIN D3) 2000 UNITS TABS Take 2 tablets by mouth daily.        Marland Kitchen estradiol (ESTRING) 2 MG vaginal ring Place 2 mg vaginally every 3 (three) months. follow package directions       . OnabotulinumtoxinA (BOTOX IJ) Inject as directed as directed.        Marland Kitchen tiZANidine (ZANAFLEX) 2 MG tablet 3 at bedtime          Review of Systems Review of Systems  Constitutional: Negative for fever, appetite change, fatigue and unexpected weight change.  Eyes: Negative for pain and visual disturbance.  Respiratory: Negative for cough and shortness of breath.   Cardiovascular: Negative.  for cp or palp Gastrointestinal: Negative for nausea, diarrhea and constipation.  Genitourinary: Negative for urgency and frequency.  Skin: Negative for pallor. or rash Neurological: pos for weakness/ numbness/ neg for headaches  Hematological: Negative for adenopathy. Does not bruise/bleed easily.  Psychiatric/Behavioral: Negative for dysphoric mood. The patient is not nervous/anxious.          Objective:   Physical Exam  Constitutional: She appears well-developed and well-nourished.       In wheelchair- well appearing   HENT:  Head: Normocephalic and atraumatic.  Mouth/Throat: Oropharynx is clear and moist.  Eyes: Conjunctivae and EOM are normal. Pupils are equal, round, and reactive to light.  Neck: Normal range of motion. Neck supple. No JVD present. Carotid bruit is not present. No thyromegaly present.  Cardiovascular: Normal rate, regular rhythm, normal heart sounds and intact distal pulses.   Pulmonary/Chest: Effort normal and breath sounds normal. No respiratory distress. She has no wheezes.  Abdominal: Soft. Bowel sounds are normal. She exhibits no distension and no abdominal bruit. There is no tenderness.  Musculoskeletal: She exhibits no edema.  Lymphadenopathy:    She has no cervical adenopathy.    Neurological: She is alert. She exhibits abnormal muscle tone. Coordination normal.  Skin: Skin is warm and dry. No rash noted. No erythema. No pallor.  Psychiatric: She has a normal mood and affect.       Cheerful and talkative today           Assessment & Plan:

## 2011-02-10 ENCOUNTER — Ambulatory Visit: Payer: Medicare PPO | Admitting: Psychiatry

## 2011-02-15 ENCOUNTER — Ambulatory Visit (INDEPENDENT_AMBULATORY_CARE_PROVIDER_SITE_OTHER): Payer: Medicare PPO | Admitting: Psychiatry

## 2011-02-15 DIAGNOSIS — F0630 Mood disorder due to known physiological condition, unspecified: Secondary | ICD-10-CM

## 2011-02-18 ENCOUNTER — Telehealth: Payer: Self-pay

## 2011-02-18 NOTE — Telephone Encounter (Signed)
[employer] faxed form for repair's to pt's wheelchair. Form is on your shelf in the in box.

## 2011-02-19 IMAGING — US ABDOMEN ULTRASOUND
1 series · 17 of 25 positions shown · non-contrast
Comparison: none

REASON FOR EXAM: elevated LFT
COMMENTS:

[Series 1: abdomen ultrasound · 17 of 83 slices shown]
[im 1/83]
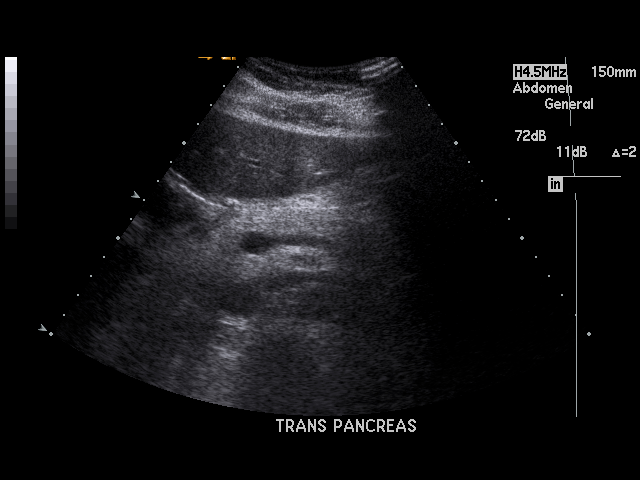
[im 7/83]
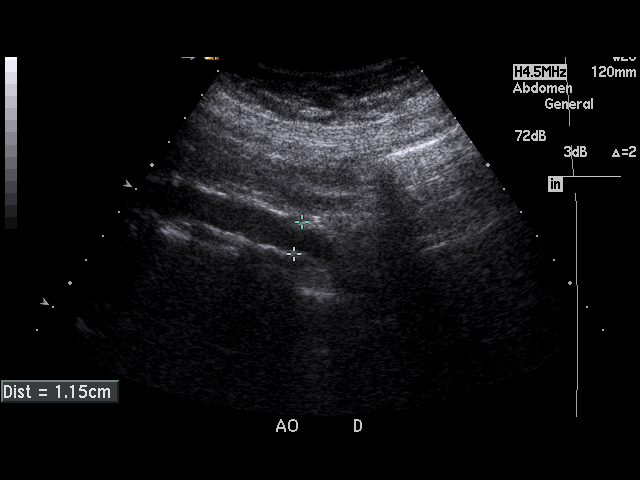
[im 11/83]
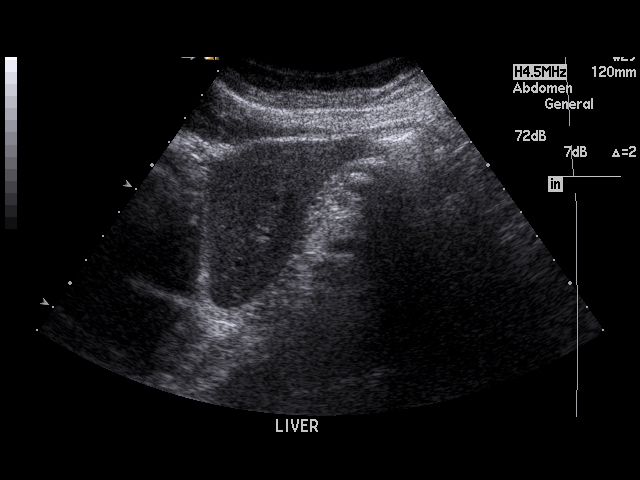
[im 18/83]
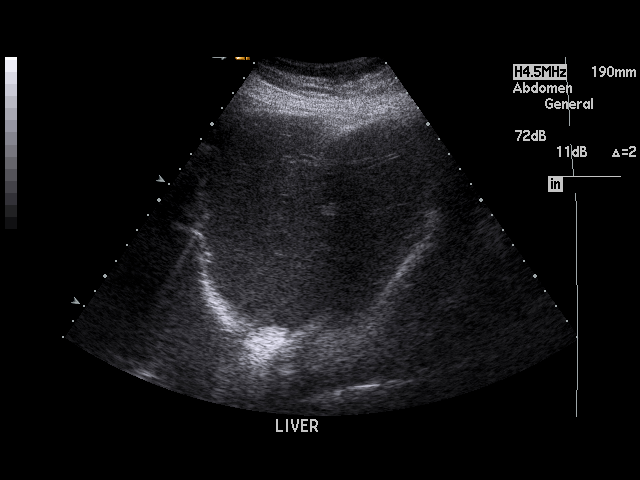
[im 21/83]
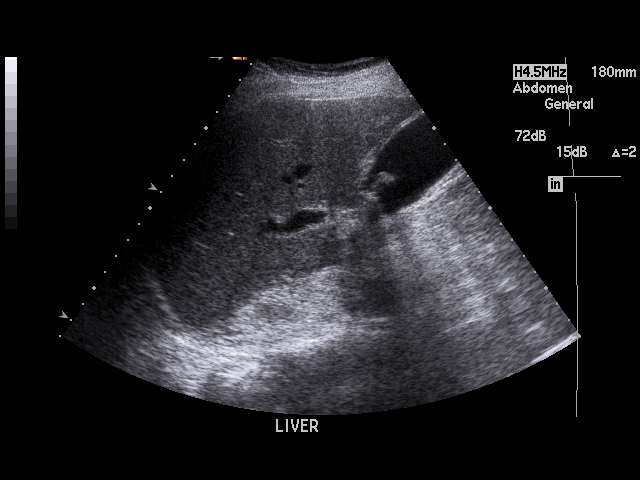
[im 28/83]
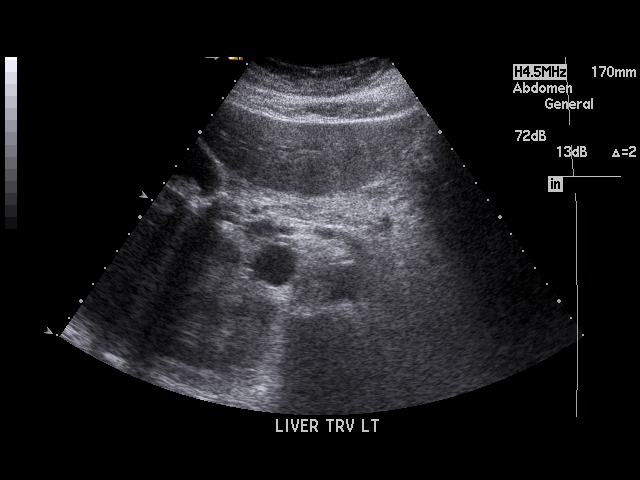
[im 31/83]
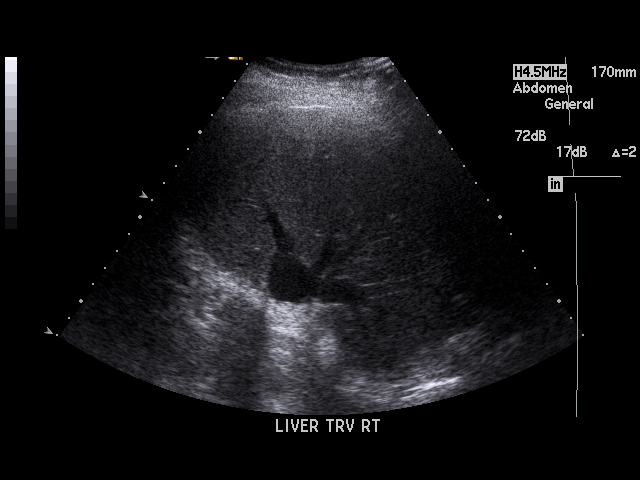
[im 38/83]
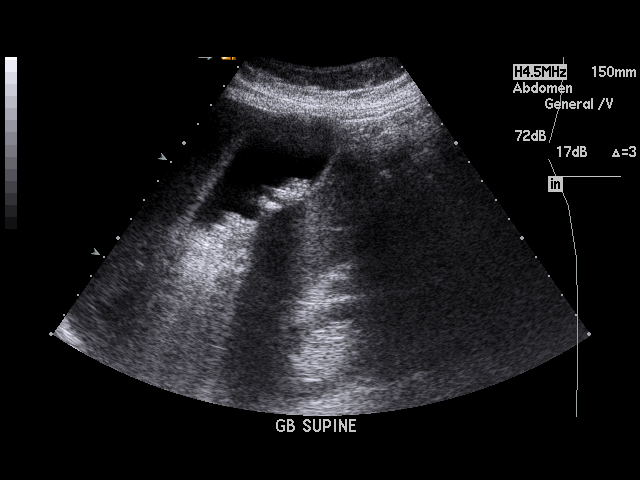
[im 42/83]
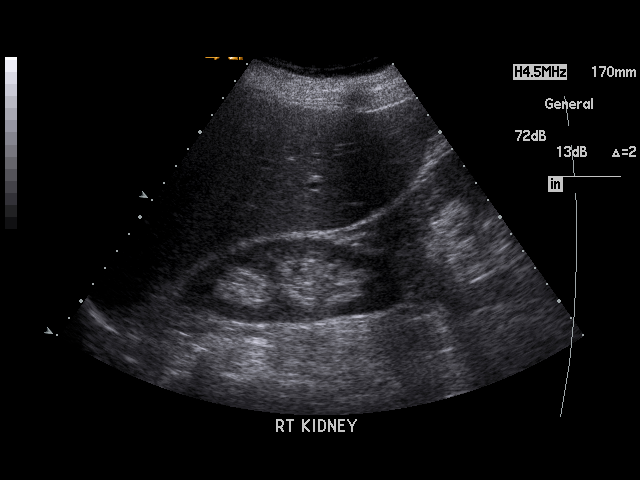
[im 45/83]
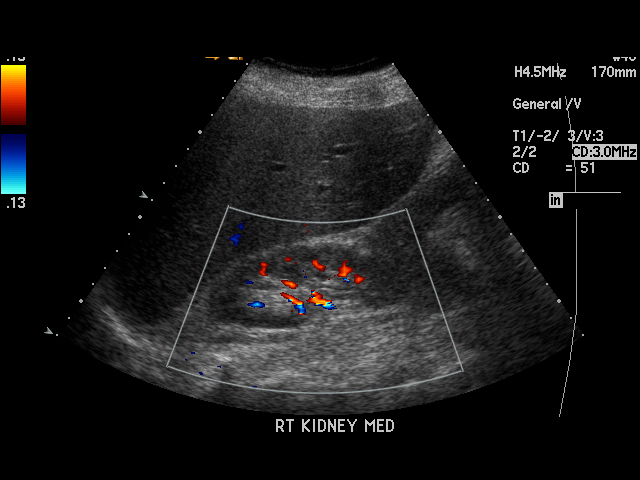
[im 52/83]
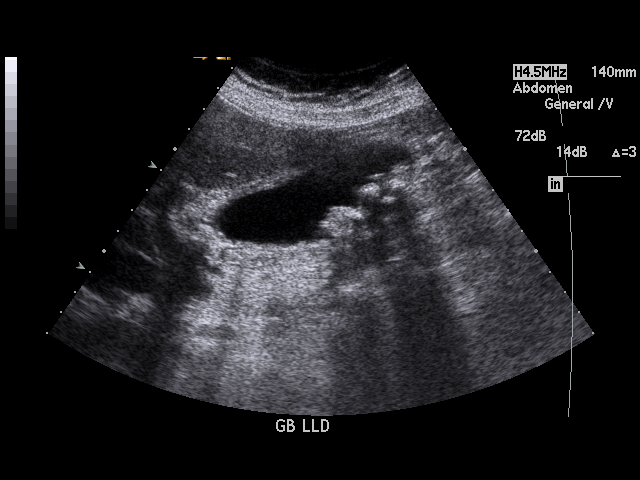
[im 55/83]
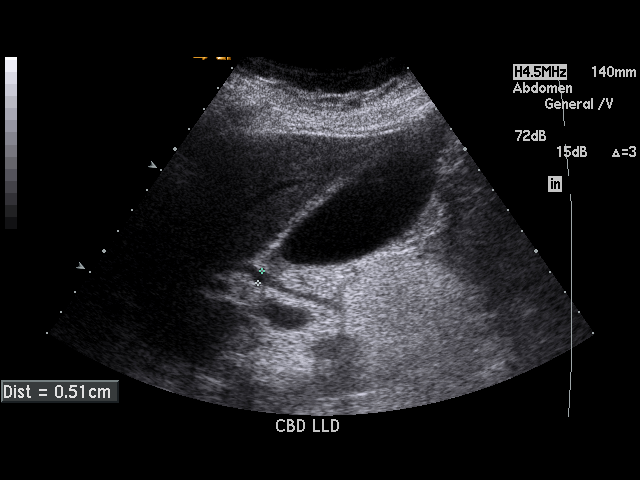
[im 62/83]
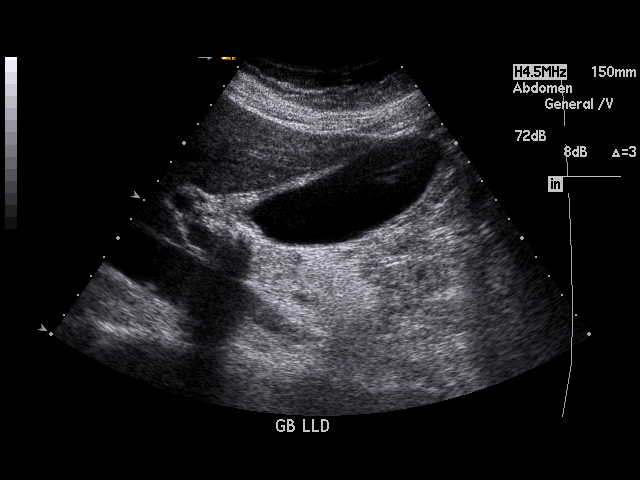
[im 65/83]
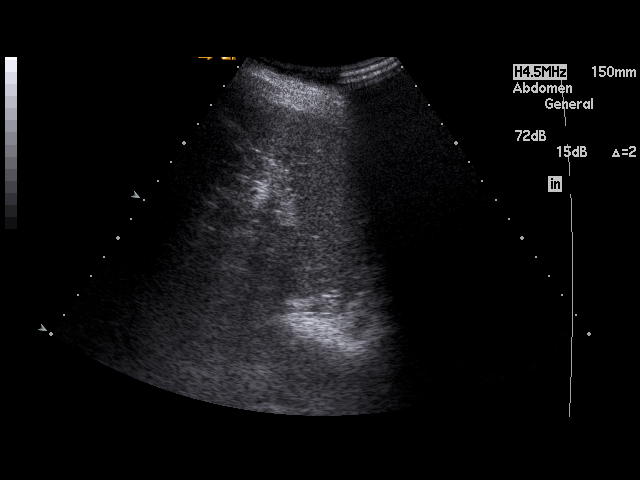
[im 72/83]
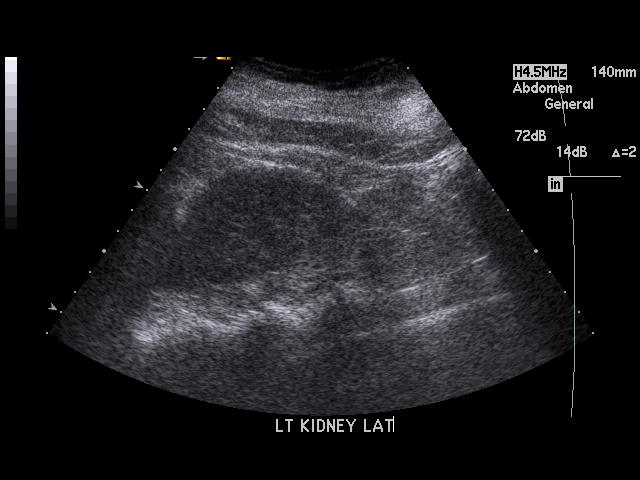
[im 76/83]
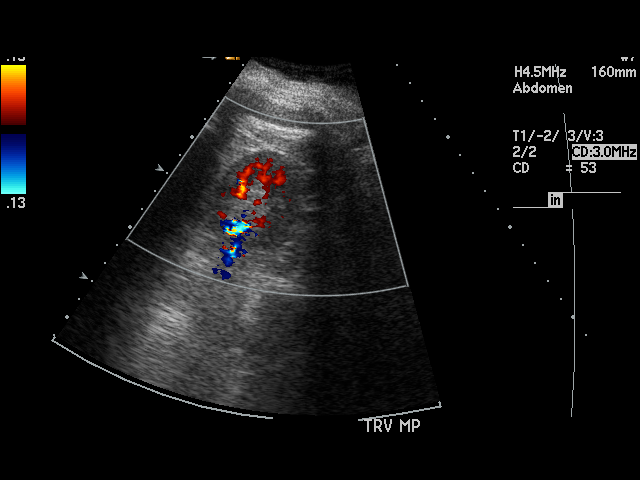
[im 83/83]
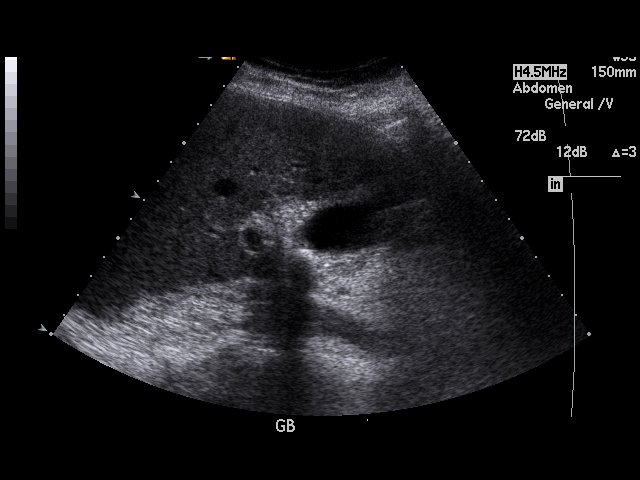

[17 of 25 positions shown; findings below may reference images not displayed]

PROCEDURE:     US  - US ABDOMEN GENERAL SURVEY  - July 22, 2009  [DATE]

RESULT:     The liver, spleen, pancreas, abdominal aorta and inferior vena
cava show no significant abnormalities. There are observed echodensities in
the gallbladder compatible with gallstones. One of the stones does not move
from the gallbladder neck during the course of this exam. This could be a
transient finding or could represent impending impaction. There is no
thickening of the gallbladder wall. The common bile duct measures 5.1 mm in
diameter which is within normal limits. The kidneys show no hydronephrosis.
There is no ascites.
IMPRESSION: 1.  Cholelithiasis.
2.  One of the stones remains in the gallbladder neck throughout the exam
and does not change position as the patient moves. This may be a transient
finding or could represent impending impaction. At this time, no thickening
of the gallbladder wall is seen and no pericholecystic fluid is observed.
3.  The common bile duct is normal in size.

## 2011-02-19 NOTE — Telephone Encounter (Signed)
Done and in IN box 

## 2011-02-22 NOTE — Telephone Encounter (Signed)
Completed form faxed to [phone] as instructed. Sent to be scanned.

## 2011-03-16 ENCOUNTER — Ambulatory Visit: Payer: Medicare PPO | Admitting: Psychiatry

## 2011-04-15 ENCOUNTER — Ambulatory Visit (INDEPENDENT_AMBULATORY_CARE_PROVIDER_SITE_OTHER): Payer: Medicare PPO | Admitting: Psychiatry

## 2011-04-15 DIAGNOSIS — F0630 Mood disorder due to known physiological condition, unspecified: Secondary | ICD-10-CM

## 2011-05-17 ENCOUNTER — Ambulatory Visit (INDEPENDENT_AMBULATORY_CARE_PROVIDER_SITE_OTHER): Payer: Medicare PPO | Admitting: Psychiatry

## 2011-05-17 DIAGNOSIS — F0630 Mood disorder due to known physiological condition, unspecified: Secondary | ICD-10-CM

## 2011-06-08 ENCOUNTER — Other Ambulatory Visit: Payer: Self-pay | Admitting: *Deleted

## 2011-06-08 ENCOUNTER — Other Ambulatory Visit: Payer: Self-pay | Admitting: Internal Medicine

## 2011-06-08 MED ORDER — LORAZEPAM 0.5 MG PO TABS: 0.5000 mg | ORAL_TABLET | Freq: Every day | ORAL | Status: DC | PRN

## 2011-06-08 NOTE — Telephone Encounter (Signed)
Px written for call in   

## 2011-06-08 NOTE — Telephone Encounter (Signed)
Medication phoned to [facility] as instructed.  

## 2011-06-09 NOTE — Telephone Encounter (Signed)
Refill done, helping pam clean out her in-basket.

## 2011-06-14 ENCOUNTER — Ambulatory Visit: Payer: Medicare PPO | Admitting: Psychiatry

## 2011-06-21 ENCOUNTER — Ambulatory Visit: Payer: Medicare PPO | Admitting: Psychiatry

## 2011-07-28 ENCOUNTER — Ambulatory Visit (INDEPENDENT_AMBULATORY_CARE_PROVIDER_SITE_OTHER): Payer: Medicare PPO | Admitting: Family Medicine

## 2011-07-28 ENCOUNTER — Encounter: Payer: Self-pay | Admitting: Family Medicine

## 2011-07-28 VITALS — BP 120/74 | HR 80 | Temp 97.7°F

## 2011-07-28 DIAGNOSIS — E785 Hyperlipidemia, unspecified: Secondary | ICD-10-CM

## 2011-07-28 DIAGNOSIS — I10 Essential (primary) hypertension: Secondary | ICD-10-CM

## 2011-07-28 DIAGNOSIS — E559 Vitamin D deficiency, unspecified: Secondary | ICD-10-CM

## 2011-07-28 DIAGNOSIS — E538 Deficiency of other specified B group vitamins: Secondary | ICD-10-CM

## 2011-07-28 LAB — LIPID PANEL
Cholesterol: 285 mg/dL — ABNORMAL HIGH (ref 0–200)
HDL: 49.1 mg/dL (ref >39.00)
Total CHOL/HDL Ratio: 6
VLDL: 41.2 mg/dL — ABNORMAL HIGH (ref 0.0–40.0)

## 2011-07-28 NOTE — Patient Instructions (Signed)
Talk to the ladies up front about switching to [employer] a release of info for Angelica Wilcox and put her phone number in the chart If you are interested in a shingles/zoster vaccine - call your insurance to check on coverage,( you should not get it within 1 month of other vaccines) , then call us for a prescription  for it to take to a pharmacy that gives the shot  Labs today  You will need your next follow up in 6 months at the [place] office if you choose to go there

## 2011-07-28 NOTE — Progress Notes (Signed)
Subjective:    Patient ID: Angelica Wilcox, female    DOB: 01/24/1946, 66 y.o.   MRN: [id number]  HPI Here for f/u of chronic medical problems Is doing pretty well overall   MS Was at [facility] recently - went on baclofen pump trial - pump into spine  They observed her  Did have some positive effects  Still felt a little slowed and heavy using it - have to adj dosage  It helped contracture in legs and allowed her knees to stay together  Is deciding if she wants to try this long term  Had questions about her px  Needs refils - needs new ones   Will be changing to regular medicare soon  In the process - afraid of that   The new [place] in [place] is very close to her house - she could get there much more easily  bp is 120/72     Today No cp or palpitations or headaches or edema  No side effects to medicines   On ace  ? Last lipids-- due for check  Eats fairly - but not perfect No hx of vascular dz HTN in good control   Vit D def- in nl range this summer with current supplement  She is just taking 2000 just once daily instead of 2 times  Wants to check level to see if she needs to take 2nd dose   Is having a lot of sleepiness  Sleeps at night pretty well - does go to bed 2am and gets up at 9-10 , in an hour is nodding off  No hx of sleep apnea  No snoring or apnea noted This all may be from her medication Not taking B12 with hx of deficiency- ? Adding to that   Patient Active Problem List  Diagnoses  . VITAMIN B12 DEFICIENCY  . UNSPECIFIED VITAMIN D DEFICIENCY  . HYPERLIPIDEMIA  . ANXIETY DEPRESSION  . MULTIPLE SCLEROSIS  . GERD  . DIVERTICULITIS, COLON  . CONSTIPATION, CHRONIC  . CHOLELITHIASIS, ASYMPTOMATIC  . PROLAPSE, VAGINAL WALL, RECTOCELE  . OSTEOPOROSIS  . FATIGUE  . HEADACHE  . INCONTINENCE  . NONSPEC ELEVATION OF LEVELS OF TRANSAMINASE/LDH  . CONTRACTURE OF HAND JOINT  . SOMNOLENCE  . OTHER ABNORMALITY OF URINATION  . Hypertension   Past Medical  History  Diagnosis Date  . Dysthymic disorder   . Calculus of gallbladder without mention of cholecystitis or obstruction   . Other constipation   . Contracture of hand joint   . Diverticulitis of colon (without mention of hemorrhage)   . Other malaise and fatigue   . Esophageal reflux   . Headache   . Other and unspecified hyperlipidemia   . Unspecified urinary incontinence   . Unspecified essential hypertension   . Multiple sclerosis   . Nonspecific elevation of levels of transaminase or lactic acid dehydrogenase (LDH)   . Osteoporosis, unspecified   . Other abnormality of urination   . Rectocele   . Other alteration of consciousness   . Unspecified vitamin D deficiency   . Other B-complex deficiencies    Past Surgical History  Procedure Date  . Tubal ligation   . Tonsillectomy and adenoidectomy   . Colon screen 1997  . Carotid doppler 09/2002    Negative  . Dexa 12/2008    OP, slt worse   History  Substance Use Topics  . Smoking status: Former Smoker    Quit date: 05/31/1976  . Smokeless tobacco: Never Used  .  Alcohol Use: Yes     rarely   Family History  Problem Relation Age of Onset  . Cancer Father   . Hypertension Father   . Alcohol abuse Father   . Heart failure Father     CHF  . Glaucoma Mother   . Pulmonary fibrosis Mother   . Celiac disease      sibling   Allergies  Allergen Reactions  . Alendronate Sodium     REACTION: GI  . Atorvastatin     REACTION: myalgia  . Bactrim Nausea And Vomiting  . Ciprofloxacin     REACTION: vomiting   Current Outpatient Prescriptions on File Prior to Visit  Medication Sig Dispense Refill  . baclofen (LIORESAL) 10 MG tablet Take 5-10 mg by mouth 3 (three) times daily.        . Cholecalciferol (VITAMIN D3) 2000 UNITS TABS Take 1 tablet by mouth daily.       Marland Kitchen escitalopram (LEXAPRO) 20 MG tablet Take 1 tablet (20 mg total) by mouth daily.  30 tablet  11  . gabapentin (NEURONTIN) 300 MG capsule Take 300 mg by mouth  as directed. 1 every morning, 1 at 3pm, and 2 at night       . HYDROcodone-acetaminophen (VICODIN) 5-500 MG per tablet Take 1 tablet by mouth every 8 (eight) hours as needed.        Marland Kitchen lisinopril (PRINIVIL,ZESTRIL) 20 MG tablet Take 1 tablet (20 mg total) by mouth daily.  30 tablet  11  . calcitonin, salmon, (MIACALCIN/FORTICAL) 200 UNIT/ACT nasal spray 1 spray by Nasal route daily.        Marland Kitchen estradiol (ESTRING) 2 MG vaginal ring Place 2 mg vaginally every 3 (three) months. follow package directions       . LORazepam (ATIVAN) 0.5 MG tablet Take 1 tablet (0.5 mg total) by mouth daily as needed.  30 tablet  3  . OnabotulinumtoxinA (BOTOX IJ) Inject as directed as directed.        Marland Kitchen tiZANidine (ZANAFLEX) 2 MG tablet 3 at bedtime      . VESICARE 5 MG tablet TAKE 1 TABLET BY MOUTH EVERY DAY  30 tablet  6          Review of Systems     Objective:   Physical Exam  Constitutional: She appears well-developed and well-nourished. No distress.       Well appearing , in wheelchair  HENT:  Head: Normocephalic and atraumatic.  Mouth/Throat: Oropharynx is clear and moist.  Eyes: Conjunctivae and EOM are normal. Pupils are equal, round, and reactive to light. No scleral icterus.  Neck: Normal range of motion. Neck supple. No JVD present. Carotid bruit is not present. No thyromegaly present.  Cardiovascular: Normal rate, regular rhythm, normal heart sounds and intact distal pulses.   Pulmonary/Chest: Effort normal and breath sounds normal. No respiratory distress. She has no wheezes.  Musculoskeletal: Normal range of motion. She exhibits no edema and no tenderness.  Lymphadenopathy:    She has no cervical adenopathy.  Neurological: She is alert. No cranial nerve deficit. She exhibits abnormal muscle tone. Coordination abnormal.       In wheelchair  Fair dexterity in hands  Able to lean forward for lung exam  Weakness is baseline   Skin: Skin is warm and dry. No rash noted. No erythema. No pallor.    Psychiatric: She has a normal mood and affect.       Cheerful and talkative today Supportive friend present  Assessment & Plan:

## 2011-07-29 NOTE — Assessment & Plan Note (Signed)
Hast been diet controlled Diet fair lately Rev low sat/ trans fat diet Lab today and update

## 2011-07-29 NOTE — Assessment & Plan Note (Addendum)
No supplements currently Will check level and adv Increased fatigue is noted

## 2011-07-29 NOTE — Assessment & Plan Note (Signed)
bp in fair control at this time  No changes needed  Disc lifstyle change with low sodium diet and exercise   Doing well on ace Lab today

## 2011-07-29 NOTE — Assessment & Plan Note (Signed)
Pt was confused and took less than recommended  Will check level today and advise Disc imp to bone and general health

## 2011-09-07 ENCOUNTER — Encounter: Payer: Self-pay | Admitting: Family Medicine

## 2011-09-07 ENCOUNTER — Ambulatory Visit (INDEPENDENT_AMBULATORY_CARE_PROVIDER_SITE_OTHER): Payer: Medicare Other | Admitting: Family Medicine

## 2011-09-07 VITALS — BP 118/62 | HR 86 | Temp 98.1°F

## 2011-09-07 DIAGNOSIS — R300 Dysuria: Secondary | ICD-10-CM

## 2011-09-07 DIAGNOSIS — E538 Deficiency of other specified B group vitamins: Secondary | ICD-10-CM | POA: Diagnosis not present

## 2011-09-07 DIAGNOSIS — G35 Multiple sclerosis: Secondary | ICD-10-CM

## 2011-09-07 DIAGNOSIS — G35D Multiple sclerosis, unspecified: Secondary | ICD-10-CM

## 2011-09-07 DIAGNOSIS — N390 Urinary tract infection, site not specified: Secondary | ICD-10-CM | POA: Diagnosis not present

## 2011-09-07 LAB — POCT URINALYSIS DIPSTICK
Ketones, UA: NEGATIVE
Spec Grav, UA: <=1.005
Urobilinogen, UA: 0.2
pH, UA: 8

## 2011-09-07 MED ORDER — HYDROCODONE-ACETAMINOPHEN 5-500 MG PO TABS: 1.0000 | ORAL_TABLET | Freq: Three times a day (TID) | ORAL | Status: DC | PRN

## 2011-09-07 MED ORDER — CEPHALEXIN 250 MG PO CAPS: 250.0000 mg | ORAL_CAPSULE | Freq: Two times a day (BID) | ORAL | Status: AC

## 2011-09-07 MED ORDER — CYANOCOBALAMIN 1000 MCG/ML IJ SOLN: INTRAMUSCULAR | Status: DC

## 2011-09-07 MED ORDER — CYANOCOBALAMIN 1000 MCG/ML IJ SOLN
1000.0000 ug | Freq: Once | INTRAMUSCULAR | Status: AC
  Administered 2011-09-07: 1000 ug via INTRAMUSCULAR

## 2011-09-07 MED ORDER — GABAPENTIN 300 MG PO CAPS: 300.0000 mg | ORAL_CAPSULE | ORAL | Status: DC

## 2011-09-07 NOTE — Progress Notes (Signed)
Subjective:    Patient ID: Angelica Wilcox, female    DOB: 08-09-1945, 66 y.o.   MRN: [id number]  HPI Here for f/u of low B12 and also poss uti   B12 level was 190 Inf needs to start shots and was transferring to the [facility] office  Not doing very well at the moment   Will be seeing Dr Darrick Huntsman in June  Has friend who is an Charity fundraiser who can give them   Vit D good at 56  Chol quite high Lab Results  Component Value Date   CHOL 285* 07/28/2011   HDL 49.10 07/28/2011   LDLDIRECT 198.6 07/28/2011   TRIG 206.0* 07/28/2011   CHOLHDL 6 07/28/2011    Urinary sympt Thinks she may have uti - but symptoms come and go At times it burns , also and feels like spasms at times  No odor or blood or other symptoms No fever or n/v at all  Is drinking water - but more coffee and coke than she should   Did have a busy weekend with family in also   Brings friend who is a Engineer, civil (consulting) with her today  She can start giving her B12 shots at home  She has many concerns today- not all can be addressed at this visit She is upset that her med list is not correct Also upset that her pain is not well controlled-- note: pt has NOT expressed this to me- unsure why  But is also not taking her gabapentin as directed  Is smoking marjiuana still - I have concerns about mixing that with px meds  Patient Active Problem List  Diagnoses  . VITAMIN B12 DEFICIENCY  . UNSPECIFIED VITAMIN D DEFICIENCY  . HYPERLIPIDEMIA  . ANXIETY DEPRESSION  . MULTIPLE SCLEROSIS  . GERD  . DIVERTICULITIS, COLON  . CONSTIPATION, CHRONIC  . CHOLELITHIASIS, ASYMPTOMATIC  . PROLAPSE, VAGINAL WALL, RECTOCELE  . OSTEOPOROSIS  . FATIGUE  . HEADACHE  . INCONTINENCE  . NONSPEC ELEVATION OF LEVELS OF TRANSAMINASE/LDH  . CONTRACTURE OF HAND JOINT  . SOMNOLENCE  . OTHER ABNORMALITY OF URINATION  . Hypertension  . UTI (lower urinary tract infection)   Past Medical History  Diagnosis Date  . Dysthymic disorder   . Calculus of gallbladder without  mention of cholecystitis or obstruction   . Other constipation   . Contracture of hand joint   . Diverticulitis of colon (without mention of hemorrhage)   . Other malaise and fatigue   . Esophageal reflux   . Headache   . Other and unspecified hyperlipidemia   . Unspecified urinary incontinence   . Unspecified essential hypertension   . Multiple sclerosis   . Nonspecific elevation of levels of transaminase or lactic acid dehydrogenase (LDH)   . Osteoporosis, unspecified   . Other abnormality of urination   . Rectocele   . Other alteration of consciousness   . Unspecified vitamin D deficiency   . Other B-complex deficiencies    Past Surgical History  Procedure Date  . Tubal ligation   . Tonsillectomy and adenoidectomy   . Colon screen 1997  . Carotid doppler 09/2002    Negative  . Dexa 12/2008    OP, slt worse   History  Substance Use Topics  . Smoking status: Former Smoker    Quit date: 05/31/1976  . Smokeless tobacco: Never Used  . Alcohol Use: Yes     rarely   Family History  Problem Relation Age of Onset  . Cancer Father   .  Hypertension Father   . Alcohol abuse Father   . Heart failure Father     CHF  . Glaucoma Mother   . Pulmonary fibrosis Mother   . Celiac disease      sibling   Allergies  Allergen Reactions  . Alendronate Sodium     REACTION: GI  . Atorvastatin     REACTION: myalgia  . Bactrim Nausea And Vomiting  . Ciprofloxacin     REACTION: vomiting   Current Outpatient Prescriptions on File Prior to Visit  Medication Sig Dispense Refill  . baclofen (LIORESAL) 10 MG tablet Take 5-10 mg by mouth 3 (three) times daily.        . Cholecalciferol (VITAMIN D3) 2000 UNITS TABS Take 1 tablet by mouth daily.       Marland Kitchen escitalopram (LEXAPRO) 20 MG tablet Take 1 tablet (20 mg total) by mouth daily.  30 tablet  11  . gabapentin (NEURONTIN) 300 MG capsule Take 1 capsule (300 mg total) by mouth as directed. 1 every morning, 1 at 3pm, and 2 at night  120  capsule  3  . lisinopril (PRINIVIL,ZESTRIL) 20 MG tablet Take 1 tablet (20 mg total) by mouth daily.  30 tablet  11  . LORazepam (ATIVAN) 0.5 MG tablet Take 1 tablet (0.5 mg total) by mouth daily as needed.  30 tablet  3      Review of Systems Review of Systems  Constitutional: Negative for fever, appetite change,  and unexpected weight change.pos for fatigue   Eyes: Negative for pain and visual disturbance.  Respiratory: Negative for cough and shortness of breath.   Cardiovascular: Negative for cp or palpitations    Gastrointestinal: Negative for nausea, diarrhea and constipation.  Genitourinary: pos for urgency/ frequency/ neg for blood in urine  Skin: Negative for pallor or rash   Neurological: Negative for , light-headedness, numbness and headaches. pos for weakness , and pos for muscle cramping and pain with spacticity Hematological: Negative for adenopathy. Does not bruise/bleed easily.  Psychiatric/Behavioral: Negative for dysphoric mood. The patient is not nervous/anxious.  (her friend thinks she is more depressed - pt is not in agreement)         Objective:   Physical Exam  Constitutional: She appears well-developed and well-nourished. No distress.       Wheelchair bound/ somewhat frail appearing , in no distress   HENT:  Head: Normocephalic and atraumatic.  Mouth/Throat: Oropharynx is clear and moist.  Eyes: Conjunctivae and EOM are normal. Pupils are equal, round, and reactive to light. No scleral icterus.  Neck: Normal range of motion. Neck supple. No JVD present. Carotid bruit is not present. No thyromegaly present.  Cardiovascular: Normal rate, regular rhythm, normal heart sounds and intact distal pulses.  Exam reveals no gallop.   Pulmonary/Chest: Effort normal and breath sounds normal. No respiratory distress. She has no wheezes.  Abdominal: Soft. Bowel sounds are normal. She exhibits no distension and no mass. There is no tenderness.  Musculoskeletal: She exhibits  tenderness. She exhibits no edema.       Diffuse weakness- wheelchair bound Some limited rom hands - contractions No joint swelling   Lymphadenopathy:    She has no cervical adenopathy.  Neurological: She is alert. She has normal reflexes. No cranial nerve deficit. She exhibits abnormal muscle tone. Coordination normal.  Skin: Skin is warm and dry. No rash noted. No pallor.  Psychiatric: She has a normal mood and affect.       Pt does not  seem depressed or anxious today          Assessment & Plan:

## 2011-09-07 NOTE — Patient Instructions (Signed)
B12 injection today Give one B12 injection once weekly for 3 more weeks  Follow up in 1 month  Take gabapentin 300 mg at 3 pm please and will see how this helps Use vicodin for breatkthrough pain Take keflex as directed for uti  Drink more water and less of other fluids

## 2011-09-08 ENCOUNTER — Ambulatory Visit: Payer: Medicare PPO | Admitting: Family Medicine

## 2011-09-09 DIAGNOSIS — G35 Multiple sclerosis: Secondary | ICD-10-CM | POA: Diagnosis not present

## 2011-09-12 NOTE — Assessment & Plan Note (Signed)
First shot today Home nurse will follow with 1 shot weekly for 3 more weeks Then she will follow up

## 2011-09-12 NOTE — Assessment & Plan Note (Signed)
Treat with keflex for uti Pend cx result Disc red flags for pyelo Update if not starting to improve in a week or if worsening

## 2011-09-12 NOTE — Assessment & Plan Note (Signed)
This is generally worsening- as is weakness/ contractures and pain  Adv going back to mid day dose of gabapentin and then only adding vicodin prn  This med could all be adding to fatigue  Also disc poss interaction with marijuana with her px meds- she is aware  Will f/u in about 1 mo

## 2011-09-14 ENCOUNTER — Telehealth: Payer: Self-pay | Admitting: Family Medicine

## 2011-09-14 ENCOUNTER — Ambulatory Visit (INDEPENDENT_AMBULATORY_CARE_PROVIDER_SITE_OTHER): Payer: Medicare Other | Admitting: *Deleted

## 2011-09-14 DIAGNOSIS — R300 Dysuria: Secondary | ICD-10-CM

## 2011-09-14 DIAGNOSIS — N390 Urinary tract infection, site not specified: Secondary | ICD-10-CM

## 2011-09-14 LAB — POCT URINALYSIS DIPSTICK
Glucose, UA: NEGATIVE
Ketones, UA: NEGATIVE
Spec Grav, UA: 1.015
Urobilinogen, UA: 0.2

## 2011-09-14 MED ORDER — LEVOFLOXACIN 250 MG PO TABS: 250.0000 mg | ORAL_TABLET | Freq: Every day | ORAL | Status: AC

## 2011-09-14 NOTE — Telephone Encounter (Signed)
Looks like her urine is still positive -- ? If keflex is working  Per chart she can not have cipro or sulfa - so options are limited for treatment  Will send urine for a culture  Ask if she knows if she can take bactrim or levaquin - thanks

## 2011-09-14 NOTE — Telephone Encounter (Signed)
Patient advised as instructed via telephone, she cannot tolerate Bactrim but can tolerate Levaquin.  [facility]

## 2011-09-14 NOTE — Telephone Encounter (Signed)
Patient was advised to check with her pharmacy for Rx.

## 2011-09-14 NOTE — Telephone Encounter (Signed)
Will send levaquin to her pharmacy, thanks

## 2011-09-15 ENCOUNTER — Ambulatory Visit: Payer: Medicare Other

## 2011-09-15 DIAGNOSIS — N390 Urinary tract infection, site not specified: Secondary | ICD-10-CM | POA: Diagnosis not present

## 2011-09-17 DIAGNOSIS — I10 Essential (primary) hypertension: Secondary | ICD-10-CM | POA: Diagnosis not present

## 2011-09-17 DIAGNOSIS — G35 Multiple sclerosis: Secondary | ICD-10-CM | POA: Diagnosis not present

## 2011-09-17 DIAGNOSIS — Z01818 Encounter for other preprocedural examination: Secondary | ICD-10-CM | POA: Diagnosis not present

## 2011-09-17 DIAGNOSIS — F329 Major depressive disorder, single episode, unspecified: Secondary | ICD-10-CM | POA: Diagnosis not present

## 2011-09-18 LAB — URINE CULTURE: Colony Count: >=100000

## 2011-09-20 ENCOUNTER — Telehealth: Payer: Self-pay

## 2011-09-20 NOTE — Telephone Encounter (Signed)
Patient advised of urine culture report via telephone.

## 2011-09-20 NOTE — Telephone Encounter (Signed)
Pt request urine culture report.

## 2011-09-21 ENCOUNTER — Ambulatory Visit (INDEPENDENT_AMBULATORY_CARE_PROVIDER_SITE_OTHER): Payer: Medicare Other | Admitting: *Deleted

## 2011-09-21 DIAGNOSIS — R300 Dysuria: Secondary | ICD-10-CM

## 2011-09-21 LAB — POCT URINALYSIS DIPSTICK
Ketones, UA: NEGATIVE
Protein, UA: NEGATIVE
Spec Grav, UA: 1.01
Urobilinogen, UA: 0.2
pH, UA: 6

## 2011-09-22 DIAGNOSIS — M62838 Other muscle spasm: Secondary | ICD-10-CM | POA: Diagnosis not present

## 2011-09-22 DIAGNOSIS — G35 Multiple sclerosis: Secondary | ICD-10-CM | POA: Diagnosis not present

## 2011-09-22 DIAGNOSIS — R000 Tachycardia, unspecified: Secondary | ICD-10-CM | POA: Diagnosis not present

## 2011-09-23 DIAGNOSIS — R0902 Hypoxemia: Secondary | ICD-10-CM | POA: Diagnosis not present

## 2011-09-23 DIAGNOSIS — I959 Hypotension, unspecified: Secondary | ICD-10-CM | POA: Diagnosis not present

## 2011-09-23 DIAGNOSIS — R339 Retention of urine, unspecified: Secondary | ICD-10-CM | POA: Diagnosis not present

## 2011-09-23 DIAGNOSIS — R000 Tachycardia, unspecified: Secondary | ICD-10-CM | POA: Diagnosis not present

## 2011-09-23 DIAGNOSIS — M62838 Other muscle spasm: Secondary | ICD-10-CM | POA: Diagnosis not present

## 2011-09-23 DIAGNOSIS — Z5189 Encounter for other specified aftercare: Secondary | ICD-10-CM | POA: Diagnosis not present

## 2011-09-23 DIAGNOSIS — N319 Neuromuscular dysfunction of bladder, unspecified: Secondary | ICD-10-CM | POA: Diagnosis not present

## 2011-09-23 DIAGNOSIS — G35D Multiple sclerosis, unspecified: Secondary | ICD-10-CM | POA: Diagnosis not present

## 2011-09-23 DIAGNOSIS — E785 Hyperlipidemia, unspecified: Secondary | ICD-10-CM | POA: Diagnosis not present

## 2011-09-23 DIAGNOSIS — I10 Essential (primary) hypertension: Secondary | ICD-10-CM | POA: Diagnosis not present

## 2011-09-23 DIAGNOSIS — F411 Generalized anxiety disorder: Secondary | ICD-10-CM | POA: Diagnosis not present

## 2011-09-23 DIAGNOSIS — G35 Multiple sclerosis: Secondary | ICD-10-CM | POA: Diagnosis not present

## 2011-09-23 DIAGNOSIS — I4949 Other premature depolarization: Secondary | ICD-10-CM | POA: Diagnosis not present

## 2011-10-03 DIAGNOSIS — G8929 Other chronic pain: Secondary | ICD-10-CM | POA: Diagnosis not present

## 2011-10-03 DIAGNOSIS — G35 Multiple sclerosis: Secondary | ICD-10-CM | POA: Diagnosis not present

## 2011-10-03 DIAGNOSIS — M62838 Other muscle spasm: Secondary | ICD-10-CM | POA: Diagnosis not present

## 2011-10-03 DIAGNOSIS — Z993 Dependence on wheelchair: Secondary | ICD-10-CM | POA: Diagnosis not present

## 2011-10-03 DIAGNOSIS — R52 Pain, unspecified: Secondary | ICD-10-CM | POA: Diagnosis not present

## 2011-10-03 DIAGNOSIS — F411 Generalized anxiety disorder: Secondary | ICD-10-CM | POA: Diagnosis not present

## 2011-10-03 DIAGNOSIS — Z9889 Other specified postprocedural states: Secondary | ICD-10-CM | POA: Diagnosis not present

## 2011-10-03 DIAGNOSIS — Z9181 History of falling: Secondary | ICD-10-CM | POA: Diagnosis not present

## 2011-10-03 DIAGNOSIS — N319 Neuromuscular dysfunction of bladder, unspecified: Secondary | ICD-10-CM | POA: Diagnosis not present

## 2011-10-03 DIAGNOSIS — Z79899 Other long term (current) drug therapy: Secondary | ICD-10-CM | POA: Diagnosis not present

## 2011-10-03 DIAGNOSIS — I10 Essential (primary) hypertension: Secondary | ICD-10-CM | POA: Diagnosis not present

## 2011-10-03 DIAGNOSIS — IMO0001 Reserved for inherently not codable concepts without codable children: Secondary | ICD-10-CM | POA: Diagnosis not present

## 2011-10-03 DIAGNOSIS — Z4889 Encounter for other specified surgical aftercare: Secondary | ICD-10-CM | POA: Diagnosis not present

## 2011-10-05 DIAGNOSIS — M62838 Other muscle spasm: Secondary | ICD-10-CM | POA: Diagnosis not present

## 2011-10-05 DIAGNOSIS — Z4889 Encounter for other specified surgical aftercare: Secondary | ICD-10-CM | POA: Diagnosis not present

## 2011-10-05 DIAGNOSIS — G8929 Other chronic pain: Secondary | ICD-10-CM | POA: Diagnosis not present

## 2011-10-05 DIAGNOSIS — IMO0001 Reserved for inherently not codable concepts without codable children: Secondary | ICD-10-CM | POA: Diagnosis not present

## 2011-10-05 DIAGNOSIS — G35 Multiple sclerosis: Secondary | ICD-10-CM | POA: Diagnosis not present

## 2011-10-05 DIAGNOSIS — R52 Pain, unspecified: Secondary | ICD-10-CM | POA: Diagnosis not present

## 2011-10-08 DIAGNOSIS — M62838 Other muscle spasm: Secondary | ICD-10-CM | POA: Diagnosis not present

## 2011-10-08 DIAGNOSIS — G35 Multiple sclerosis: Secondary | ICD-10-CM | POA: Diagnosis not present

## 2011-10-08 DIAGNOSIS — R52 Pain, unspecified: Secondary | ICD-10-CM | POA: Diagnosis not present

## 2011-10-08 DIAGNOSIS — IMO0001 Reserved for inherently not codable concepts without codable children: Secondary | ICD-10-CM | POA: Diagnosis not present

## 2011-10-08 DIAGNOSIS — Z4889 Encounter for other specified surgical aftercare: Secondary | ICD-10-CM | POA: Diagnosis not present

## 2011-10-08 DIAGNOSIS — G8929 Other chronic pain: Secondary | ICD-10-CM | POA: Diagnosis not present

## 2011-10-11 DIAGNOSIS — M62838 Other muscle spasm: Secondary | ICD-10-CM | POA: Diagnosis not present

## 2011-10-11 DIAGNOSIS — G8929 Other chronic pain: Secondary | ICD-10-CM | POA: Diagnosis not present

## 2011-10-11 DIAGNOSIS — IMO0001 Reserved for inherently not codable concepts without codable children: Secondary | ICD-10-CM | POA: Diagnosis not present

## 2011-10-11 DIAGNOSIS — G35 Multiple sclerosis: Secondary | ICD-10-CM | POA: Diagnosis not present

## 2011-10-11 DIAGNOSIS — R52 Pain, unspecified: Secondary | ICD-10-CM | POA: Diagnosis not present

## 2011-10-11 DIAGNOSIS — Z4889 Encounter for other specified surgical aftercare: Secondary | ICD-10-CM | POA: Diagnosis not present

## 2011-10-12 ENCOUNTER — Ambulatory Visit: Payer: Medicare Other | Admitting: Family Medicine

## 2011-10-13 ENCOUNTER — Encounter: Payer: Self-pay | Admitting: Family Medicine

## 2011-10-13 ENCOUNTER — Ambulatory Visit (INDEPENDENT_AMBULATORY_CARE_PROVIDER_SITE_OTHER): Payer: Medicare Other | Admitting: Family Medicine

## 2011-10-13 VITALS — BP 100/70 | HR 73 | Temp 97.8°F

## 2011-10-13 DIAGNOSIS — N390 Urinary tract infection, site not specified: Secondary | ICD-10-CM | POA: Diagnosis not present

## 2011-10-13 DIAGNOSIS — G8929 Other chronic pain: Secondary | ICD-10-CM | POA: Diagnosis not present

## 2011-10-13 DIAGNOSIS — Z4889 Encounter for other specified surgical aftercare: Secondary | ICD-10-CM | POA: Diagnosis not present

## 2011-10-13 DIAGNOSIS — M62838 Other muscle spasm: Secondary | ICD-10-CM | POA: Diagnosis not present

## 2011-10-13 DIAGNOSIS — E538 Deficiency of other specified B group vitamins: Secondary | ICD-10-CM

## 2011-10-13 DIAGNOSIS — R52 Pain, unspecified: Secondary | ICD-10-CM | POA: Diagnosis not present

## 2011-10-13 DIAGNOSIS — IMO0001 Reserved for inherently not codable concepts without codable children: Secondary | ICD-10-CM | POA: Diagnosis not present

## 2011-10-13 DIAGNOSIS — G35 Multiple sclerosis: Secondary | ICD-10-CM | POA: Diagnosis not present

## 2011-10-13 DIAGNOSIS — G35D Multiple sclerosis, unspecified: Secondary | ICD-10-CM | POA: Diagnosis not present

## 2011-10-13 LAB — VITAMIN B12: Vitamin B-12: >1500 pg/mL — ABNORMAL HIGH (ref 211–911)

## 2011-10-13 NOTE — Assessment & Plan Note (Signed)
Level today after 3 inj  Will adv further with result

## 2011-10-13 NOTE — Assessment & Plan Note (Signed)
Some improvement with the baclofen pump and also 3 pm dose of neurontin- for pain  Will continue f/u at [facility] and aggressive PT  Still remains semi- independent

## 2011-10-13 NOTE — Patient Instructions (Signed)
Checking your B12 level today and then will advise further as to what to do with dose frequency I'm glad you are doing better overall  Keep working with PT

## 2011-10-13 NOTE — Assessment & Plan Note (Signed)
With hx of urinary retention - now clear ua and no symptoms  Will continue to follow  Has appt with [facility] urol soon - ? Disc suprapubic cath

## 2011-10-13 NOTE — Progress Notes (Signed)
Subjective:    Patient ID: Angelica Wilcox, female    DOB: [birth year], 66 y.o.   MRN: [id number]  HPI Here for f/u of chronic conditions  MS- has her baclofen pump now  Has cut down on the pain quite a bit  Is less sleepy with this delivery system  Still getting weaker- feels like legs are "dead weight" at night -- is difficult to move  PT continues to work with her  Has some leg lifters - that she uses to help use her arms  Also custom splint for her R hand -- this effected her grip     B12 def Missed one in hosp- one more yet to do  Had one 2 days ago  Getting shots at home Last level 190 Need to re check  Does have some more energy  uti- tx with keflex and then levaquin ua clear on 4/23 Symptoms -- feeling much better  She had to be cathed every 6 hours in hosp --has incomplete emptying - will see neuro urologist at [facility]  Disc poss of suprapubic cath   Last time disc her pain  Adv to add gabapentin 300 mg dose at 3pm- that is helping also  Has vicodin   Patient Active Problem List  Diagnoses  . VITAMIN B12 DEFICIENCY  . UNSPECIFIED VITAMIN D DEFICIENCY  . HYPERLIPIDEMIA  . ANXIETY DEPRESSION  . MULTIPLE SCLEROSIS  . GERD  . DIVERTICULITIS, COLON  . CONSTIPATION, CHRONIC  . CHOLELITHIASIS, ASYMPTOMATIC  . PROLAPSE, VAGINAL WALL, RECTOCELE  . OSTEOPOROSIS  . FATIGUE  . HEADACHE  . INCONTINENCE  . NONSPEC ELEVATION OF LEVELS OF TRANSAMINASE/LDH  . CONTRACTURE OF HAND JOINT  . SOMNOLENCE  . OTHER ABNORMALITY OF URINATION  . Hypertension  . UTI (lower urinary tract infection)   Past Medical History  Diagnosis Date  . Dysthymic disorder   . Calculus of gallbladder without mention of cholecystitis or obstruction   . Other constipation   . Contracture of hand joint   . Diverticulitis of colon (without mention of hemorrhage)   . Other malaise and fatigue   . Esophageal reflux   . Headache   . Other and unspecified hyperlipidemia   . Unspecified urinary  incontinence   . Unspecified essential hypertension   . Multiple sclerosis   . Nonspecific elevation of levels of transaminase or lactic acid dehydrogenase (LDH)   . Osteoporosis, unspecified   . Other abnormality of urination   . Rectocele   . Other alteration of consciousness   . Unspecified vitamin D deficiency   . Other B-complex deficiencies    Past Surgical History  Procedure Date  . Tubal ligation   . Tonsillectomy and adenoidectomy   . Colon screen 1997  . Carotid doppler 09/2002    Negative  . Dexa 12/2008    OP, slt worse   History  Substance Use Topics  . Smoking status: Former Smoker    Quit date: 05/31/1976  . Smokeless tobacco: Never Used  . Alcohol Use: Yes     rarely   Family History  Problem Relation Age of Onset  . Cancer Father   . Hypertension Father   . Alcohol abuse Father   . Heart failure Father     CHF  . Glaucoma Mother   . Pulmonary fibrosis Mother   . Celiac disease      sibling   Allergies  Allergen Reactions  . Alendronate Sodium     REACTION: GI  . Atorvastatin  REACTION: myalgia  . Bactrim Nausea And Vomiting  . Ciprofloxacin     REACTION: vomiting   Current Outpatient Prescriptions on File Prior to Visit  Medication Sig Dispense Refill  . Cholecalciferol (VITAMIN D3) 2000 UNITS TABS Take 1 tablet by mouth daily.       . cyanocobalamin (,VITAMIN B-12,) 1000 MCG/ML injection One injection (1,038mcg) once a week for three weeks  1 mL  3  . escitalopram (LEXAPRO) 20 MG tablet Take 1 tablet (20 mg total) by mouth daily.  30 tablet  11  . gabapentin (NEURONTIN) 300 MG capsule Take 1 capsule (300 mg total) by mouth as directed. 1 every morning, 1 at 3pm, and 2 at night  120 capsule  3  . HYDROcodone-acetaminophen (VICODIN) 5-500 MG per tablet Take 1 tablet by mouth every 8 (eight) hours as needed.  30 tablet  3  . lisinopril (PRINIVIL,ZESTRIL) 20 MG tablet Take 1 tablet (20 mg total) by mouth daily.  30 tablet  11  . LORazepam  (ATIVAN) 0.5 MG tablet Take 1 tablet (0.5 mg total) by mouth daily as needed.  30 tablet  3        Review of Systems Review of Systems  Constitutional: Negative for fever, appetite change, fatigue and unexpected weight change.  Eyes: Negative for pain and visual disturbance.  Respiratory: Negative for cough and shortness of breath.   Cardiovascular: Negative for cp or palpitations    Gastrointestinal: Negative for nausea, diarrhea and constipation.  Genitourinary: Negative for urgency and frequency.  Skin: Negative for pallor or rash   Neurological: Negative for swalllowing problems or speech problems, pos for weakness/ numbness / contractures and pain  Hematological: Negative for adenopathy. Does not bruise/bleed easily.  Psychiatric/Behavioral: Negative for dysphoric mood. The patient is not nervous/anxious.          Objective:   Physical Exam  Constitutional: She appears well-nourished. No distress.       Well appearing , in wheelchair   HENT:  Head: Normocephalic and atraumatic.  Mouth/Throat: Oropharynx is clear and moist.  Eyes: Conjunctivae and EOM are normal. Pupils are equal, round, and reactive to light. No scleral icterus.  Neck: Normal range of motion. Neck supple. No JVD present. Carotid bruit is not present. No thyromegaly present.  Cardiovascular: Normal rate, regular rhythm, normal heart sounds and intact distal pulses.  Exam reveals no gallop.   Pulmonary/Chest: Effort normal and breath sounds normal. No respiratory distress.  Abdominal: Soft. Bowel sounds are normal. She exhibits no distension and no mass. There is no tenderness.  Musculoskeletal: She exhibits tenderness. She exhibits no edema.  Lymphadenopathy:    She has no cervical adenopathy.  Neurological: She is alert. No cranial nerve deficit. She exhibits abnormal muscle tone. Coordination abnormal.       Generalized weakness -especially in legs Some difficulty leaning forward in her chair    Contracture in R hand   Skin: Skin is warm and dry. No rash noted. No erythema. No pallor.       Fair complexion without pallor   Psychiatric: She has a normal mood and affect.          Assessment & Plan:

## 2011-10-14 ENCOUNTER — Other Ambulatory Visit: Payer: Self-pay | Admitting: *Deleted

## 2011-10-14 ENCOUNTER — Encounter: Payer: Self-pay | Admitting: Family Medicine

## 2011-10-14 DIAGNOSIS — Z4802 Encounter for removal of sutures: Secondary | ICD-10-CM | POA: Diagnosis not present

## 2011-10-14 DIAGNOSIS — G35 Multiple sclerosis: Secondary | ICD-10-CM | POA: Diagnosis not present

## 2011-10-14 MED ORDER — CYANOCOBALAMIN 1000 MCG/ML IJ SOLN: INTRAMUSCULAR | Status: DC

## 2011-10-15 DIAGNOSIS — R52 Pain, unspecified: Secondary | ICD-10-CM | POA: Diagnosis not present

## 2011-10-15 DIAGNOSIS — G35 Multiple sclerosis: Secondary | ICD-10-CM | POA: Diagnosis not present

## 2011-10-15 DIAGNOSIS — M62838 Other muscle spasm: Secondary | ICD-10-CM | POA: Diagnosis not present

## 2011-10-15 DIAGNOSIS — G8929 Other chronic pain: Secondary | ICD-10-CM | POA: Diagnosis not present

## 2011-10-15 DIAGNOSIS — Z4889 Encounter for other specified surgical aftercare: Secondary | ICD-10-CM | POA: Diagnosis not present

## 2011-10-15 DIAGNOSIS — IMO0001 Reserved for inherently not codable concepts without codable children: Secondary | ICD-10-CM | POA: Diagnosis not present

## 2011-10-18 DIAGNOSIS — G8929 Other chronic pain: Secondary | ICD-10-CM | POA: Diagnosis not present

## 2011-10-18 DIAGNOSIS — Z4889 Encounter for other specified surgical aftercare: Secondary | ICD-10-CM | POA: Diagnosis not present

## 2011-10-18 DIAGNOSIS — G35 Multiple sclerosis: Secondary | ICD-10-CM | POA: Diagnosis not present

## 2011-10-18 DIAGNOSIS — R52 Pain, unspecified: Secondary | ICD-10-CM | POA: Diagnosis not present

## 2011-10-18 DIAGNOSIS — M62838 Other muscle spasm: Secondary | ICD-10-CM | POA: Diagnosis not present

## 2011-10-18 DIAGNOSIS — IMO0001 Reserved for inherently not codable concepts without codable children: Secondary | ICD-10-CM | POA: Diagnosis not present

## 2011-10-28 DIAGNOSIS — G35 Multiple sclerosis: Secondary | ICD-10-CM | POA: Diagnosis not present

## 2011-10-28 DIAGNOSIS — N319 Neuromuscular dysfunction of bladder, unspecified: Secondary | ICD-10-CM | POA: Diagnosis not present

## 2011-11-11 ENCOUNTER — Encounter: Payer: Self-pay | Admitting: Internal Medicine

## 2011-11-11 ENCOUNTER — Other Ambulatory Visit: Payer: Self-pay | Admitting: Internal Medicine

## 2011-11-11 ENCOUNTER — Ambulatory Visit (INDEPENDENT_AMBULATORY_CARE_PROVIDER_SITE_OTHER): Payer: Medicare Other | Admitting: Internal Medicine

## 2011-11-11 VITALS — BP 124/70 | HR 78 | Temp 98.2°F | Resp 14

## 2011-11-11 DIAGNOSIS — E538 Deficiency of other specified B group vitamins: Secondary | ICD-10-CM

## 2011-11-11 DIAGNOSIS — E559 Vitamin D deficiency, unspecified: Secondary | ICD-10-CM

## 2011-11-11 DIAGNOSIS — Z23 Encounter for immunization: Secondary | ICD-10-CM | POA: Diagnosis not present

## 2011-11-11 DIAGNOSIS — Z2911 Encounter for prophylactic immunotherapy for respiratory syncytial virus (RSV): Secondary | ICD-10-CM | POA: Diagnosis not present

## 2011-11-11 DIAGNOSIS — E441 Mild protein-calorie malnutrition: Secondary | ICD-10-CM

## 2011-11-11 DIAGNOSIS — G35 Multiple sclerosis: Secondary | ICD-10-CM

## 2011-11-11 MED ORDER — B-12 500 MCG SL SUBL: 1.0000 | SUBLINGUAL_TABLET | Freq: Every day | SUBLINGUAL | Status: DC

## 2011-11-11 NOTE — Patient Instructions (Addendum)
You need 1200 to 1800 mg daily of calcium.  Add [place] yogurt and cheese along with one OsCal plus D or Viactive daily    We are adding a daily sublingual b12 supplement   Return in 3 months and we will check your protein stores then

## 2011-11-11 NOTE — Progress Notes (Signed)
Patient ID: Angelica Wilcox, female   DOB: 05/28/1946, 66 y.o.   MRN: [id number]  Patient Active Problem List  Diagnosis  . VITAMIN B12 DEFICIENCY  . UNSPECIFIED VITAMIN D DEFICIENCY  . HYPERLIPIDEMIA  . ANXIETY DEPRESSION  . MULTIPLE SCLEROSIS  . GERD  . DIVERTICULITIS, COLON  . CONSTIPATION, CHRONIC  . CHOLELITHIASIS, ASYMPTOMATIC  . PROLAPSE, VAGINAL WALL, RECTOCELE  . OSTEOPOROSIS  . FATIGUE  . HEADACHE  . INCONTINENCE  . NONSPEC ELEVATION OF LEVELS OF TRANSAMINASE/LDH  . CONTRACTURE OF HAND JOINT  . SOMNOLENCE  . OTHER ABNORMALITY OF URINATION  . Hypertension  . UTI (lower urinary tract infection)  . Protein-calorie malnutrition, mild    Subjective:  CC:   Chief Complaint  Patient presents with  . New Patient    HPI:   Angelica Wilcox a 66 y.o. female who presents as a new patient, transferring care from [facility] due to residential proximity to [place]  office.  She has a history of progressive MS, diagnosed in 41 and is nonambulatory secondary to spasticity.  She uses a motorized wheelchair and is in the process of getting a new one authorized.  She had a baclofen pump installed 2 months ago by [facility] neurologist team Fatima Sanger and Othella Boyer, MD in the Physical medicine division.  She spent 10 days in rehab post procedure and was measured for a new wheelchair. She lives independently with the daily assistance of home health aides 5 days per week for 5 hours, with PT students from [place] providing assistance on the weekends. Uses a hydraulic lift for getting in an out of bed which requires assistance by one person.  She has not had a fill in two years. Millie Raynor brought her today, and provides care Tues/Thurs 5 days/wk.  The new wheelchair will hopefully have a motorized seat level and a stand up feature. She has urge incontinence and wears a pad and pullups at night .   She had mild protein deficiency, as evidenced by an albumin 3.0 by recent labs but  has no history of bedsores.  Having difficulty in the kitchen even pouring coffee. New health aide is increasing the protein in her diet.  She has some difficulty with stooling due to constipation but uses a bidet both as an enema and for cleaning.    With urge incontinence.  Has not kept up on mammograms.  Colon CA screening, bc she would not tolerate treatment , discussed today. Still has pain in the legs relieved with prn vicodin.  b12 deficiency recently found, treated with injections.    Past Medical History  Diagnosis Date  . Dysthymic disorder   . Calculus of gallbladder without mention of cholecystitis or obstruction   . Other constipation   . Contracture of hand joint   . Diverticulitis of colon (without mention of hemorrhage)   . Other malaise and fatigue   . Esophageal reflux   . Headache   . Other and unspecified hyperlipidemia   . Unspecified urinary incontinence   . Unspecified essential hypertension   . Multiple sclerosis   . Nonspecific elevation of levels of transaminase or lactic acid dehydrogenase (LDH)   . Osteoporosis, unspecified   . Other abnormality of urination   . Rectocele   . Other alteration of consciousness   . Unspecified vitamin D deficiency   . Other B-complex deficiencies     Past Surgical History  Procedure Date  . Tubal ligation   . Tonsillectomy and adenoidectomy   .  Colon screen 1997  . Carotid doppler 09/2002    Negative  . Dexa 12/2008    OP, slt worse         The following portions of the patient's history were reviewed and updated as appropriate: Allergies, current medications, and problem list.    Review of Systems:   12 Pt  review of systems was negative except those addressed in the HPI,     History   Social History  . Marital Status: Widowed    Spouse Name: N/A    Number of Children: N/A  . Years of Education: N/A   Occupational History  . Disabled    Social History Main Topics  . Smoking status: Former Smoker     Quit date: 05/31/1976  . Smokeless tobacco: Never Used  . Alcohol Use: Yes     rarely  . Drug Use: Yes  . Sexually Active: Not on file   Other Topics Concern  . Not on file   Social History Narrative   Disabled from MS-wheelchair bound    Objective:  BP 124/70  Pulse 78  Temp 98.2 F (36.8 C) (Oral)  Resp 14  SpO2 95%  General appearance: alert, cooperative and appears stated age Ears: normal TM's and external ear canals both ears Throat: lips, mucosa, and tongue normal; teeth and gums normal Neck: no adenopathy, no carotid bruit, supple, symmetrical, trachea midline and thyroid not enlarged, symmetric, no tenderness/mass/nodules Back: symmetric, no curvature. ROM normal. No CVA tenderness. Lungs: clear to auscultation bilaterally Heart: regular rate and rhythm, S1, S2 normal, no murmur, click, rub or gallop Abdomen: soft, non-tender; bowel sounds normal; no masses,  no organomegaly Pulses: 2+ and symmetric Skin: Skin color, texture, turgor normal. No rashes or lesions Lymph nodes: Cervical, supraclavicular, and axillary nodes normal.  Assessment and Plan:  MULTIPLE SCLEROSIS Progressive, with complete loss of mobility and spasticity now managed with baclofen pump.  She requires assistance with transfers in and out of wheelchair and bed and is awaiting a new wheelchair.    UNSPECIFIED VITAMIN D DEFICIENCY Secondary to immobilization and confinement to indoors , with osteoporosis .  Will continue surveillance and supplementation.   VITAMIN B12 DEFICIENCY Discussed using sublingual supplemenation and repeating a level with next blood draw.   Protein-calorie malnutrition, mild As evidenced by an albumin of 3.0 .  Discussed ways to increased her protein intake and following up with a prealbumin level in 3 months    Updated Medication List Outpatient Encounter Prescriptions as of 11/11/2011  Medication Sig Dispense Refill  . Cholecalciferol (VITAMIN D3) 2000 UNITS  TABS Take 1 tablet by mouth daily.       Marland Kitchen escitalopram (LEXAPRO) 20 MG tablet Take 1 tablet (20 mg total) by mouth daily.  30 tablet  11  . gabapentin (NEURONTIN) 300 MG capsule Take 1 capsule (300 mg total) by mouth as directed. 1 every morning, 1 at 3pm, and 2 at night  120 capsule  3  . HYDROcodone-acetaminophen (VICODIN) 5-500 MG per tablet Take 1 tablet by mouth every 8 (eight) hours as needed.  30 tablet  3  . lisinopril (PRINIVIL,ZESTRIL) 20 MG tablet Take 1 tablet (20 mg total) by mouth daily.  30 tablet  11  . LORazepam (ATIVAN) 0.5 MG tablet Take 1 tablet (0.5 mg total) by mouth daily as needed.  30 tablet  3  . Cyanocobalamin (B-12) 500 MCG SUBL Place 1 tablet under the tongue daily.  150 tablet  11  . DISCONTD:  cyanocobalamin (,VITAMIN B-12,) 1000 MCG/ML injection One injection (1,031mcg) every 4-6 weeks  1 mL  11     Orders Placed This Encounter  Procedures  . Varicella-zoster vaccine subcutaneous  . Vitamin B12  . Hepatic function panel  . Vitamin D 25 hydroxy  . Prealbumin    Return in about 3 months (around 02/11/2012).

## 2011-11-14 ENCOUNTER — Encounter: Payer: Self-pay | Admitting: Internal Medicine

## 2011-11-14 DIAGNOSIS — E441 Mild protein-calorie malnutrition: Secondary | ICD-10-CM | POA: Insufficient documentation

## 2011-11-14 NOTE — Assessment & Plan Note (Signed)
Discussed using sublingual supplemenation and repeating a level with next blood draw.

## 2011-11-14 NOTE — Assessment & Plan Note (Signed)
Secondary to immobilization and confinement to indoors , with osteoporosis .  Will continue surveillance and supplementation.

## 2011-11-14 NOTE — Assessment & Plan Note (Signed)
As evidenced by an albumin of 3.0 .  Discussed ways to increased her protein intake and following up with a prealbumin level in 3 months

## 2011-11-14 NOTE — Assessment & Plan Note (Signed)
Progressive, with complete loss of mobility and spasticity now managed with baclofen pump.  She requires assistance with transfers in and out of wheelchair and bed and is awaiting a new wheelchair.

## 2011-11-22 ENCOUNTER — Other Ambulatory Visit: Payer: Self-pay | Admitting: Family Medicine

## 2011-11-23 DIAGNOSIS — M62838 Other muscle spasm: Secondary | ICD-10-CM | POA: Diagnosis not present

## 2011-11-23 DIAGNOSIS — G35 Multiple sclerosis: Secondary | ICD-10-CM | POA: Diagnosis not present

## 2011-12-06 ENCOUNTER — Other Ambulatory Visit: Payer: Self-pay

## 2011-12-06 NOTE — Telephone Encounter (Signed)
Ok to refill? Last OV was 07/28/11 for hypertension

## 2011-12-07 MED ORDER — LISINOPRIL 20 MG PO TABS: 20.0000 mg | ORAL_TABLET | Freq: Every day | ORAL | Status: DC

## 2011-12-07 NOTE — Telephone Encounter (Signed)
Called in Rx as directed.  

## 2011-12-31 DIAGNOSIS — R05 Cough: Secondary | ICD-10-CM | POA: Diagnosis not present

## 2011-12-31 DIAGNOSIS — G35 Multiple sclerosis: Secondary | ICD-10-CM | POA: Diagnosis not present

## 2011-12-31 DIAGNOSIS — M62838 Other muscle spasm: Secondary | ICD-10-CM | POA: Diagnosis not present

## 2011-12-31 DIAGNOSIS — R259 Unspecified abnormal involuntary movements: Secondary | ICD-10-CM | POA: Diagnosis not present

## 2012-01-11 DIAGNOSIS — G35 Multiple sclerosis: Secondary | ICD-10-CM | POA: Diagnosis not present

## 2012-01-17 DIAGNOSIS — M62838 Other muscle spasm: Secondary | ICD-10-CM | POA: Diagnosis not present

## 2012-01-17 DIAGNOSIS — G35 Multiple sclerosis: Secondary | ICD-10-CM | POA: Diagnosis not present

## 2012-01-21 ENCOUNTER — Other Ambulatory Visit: Payer: Self-pay | Admitting: Family Medicine

## 2012-01-21 NOTE — Telephone Encounter (Signed)
Request for Lexapro 20 mg #30. Last OV was 11/11/11. Last filled 01/05/11. Ok to refill?

## 2012-02-01 ENCOUNTER — Other Ambulatory Visit (INDEPENDENT_AMBULATORY_CARE_PROVIDER_SITE_OTHER): Payer: Medicare Other | Admitting: *Deleted

## 2012-02-01 DIAGNOSIS — N390 Urinary tract infection, site not specified: Secondary | ICD-10-CM | POA: Diagnosis not present

## 2012-02-01 LAB — POCT URINALYSIS DIPSTICK
Nitrite, UA: POSITIVE
Urobilinogen, UA: 0.2
pH, UA: 7

## 2012-02-02 ENCOUNTER — Other Ambulatory Visit: Payer: Self-pay | Admitting: *Deleted

## 2012-02-02 DIAGNOSIS — M6281 Muscle weakness (generalized): Secondary | ICD-10-CM | POA: Diagnosis not present

## 2012-02-02 DIAGNOSIS — IMO0001 Reserved for inherently not codable concepts without codable children: Secondary | ICD-10-CM | POA: Diagnosis not present

## 2012-02-02 DIAGNOSIS — G35 Multiple sclerosis: Secondary | ICD-10-CM | POA: Diagnosis not present

## 2012-02-02 MED ORDER — NITROFURANTOIN MONOHYD MACRO 100 MG PO CAPS: 100.0000 mg | ORAL_CAPSULE | Freq: Two times a day (BID) | ORAL | Status: AC

## 2012-02-02 NOTE — Telephone Encounter (Signed)
rx called in. See urine results.

## 2012-02-06 LAB — URINE CULTURE

## 2012-02-08 ENCOUNTER — Telehealth: Payer: Self-pay | Admitting: Internal Medicine

## 2012-02-08 MED ORDER — SULFAMETHOXAZOLE-TRIMETHOPRIM 800-160 MG PO TABS: 1.0000 | ORAL_TABLET | Freq: Two times a day (BID) | ORAL | Status: AC

## 2012-02-08 NOTE — Telephone Encounter (Signed)
Septra sent to pharmacy

## 2012-02-08 NOTE — Telephone Encounter (Signed)
Ok to call in Septra Ds one tablet twice daily  X 7 da ys  #14.  Klebsiella is the bacteria

## 2012-02-08 NOTE — Telephone Encounter (Signed)
Milly Raynor, patients caregiver called and stated patient is allergic to Cipro so Septra needs to be prescribed for the UTI.  She stated she needs an antibiotic that will not cause diarrhea because of her "bathroom situation".  Milly also wanted to know more of what kind of bacteria was in her urine, so "we can help avoid this again".  Please advise.

## 2012-02-08 NOTE — Telephone Encounter (Signed)
Left message notifying patient.

## 2012-02-15 ENCOUNTER — Encounter: Payer: Self-pay | Admitting: Internal Medicine

## 2012-02-15 ENCOUNTER — Ambulatory Visit (INDEPENDENT_AMBULATORY_CARE_PROVIDER_SITE_OTHER): Payer: Medicare Other | Admitting: Internal Medicine

## 2012-02-15 VITALS — BP 110/68 | HR 92 | Temp 97.8°F | Resp 12

## 2012-02-15 DIAGNOSIS — E441 Mild protein-calorie malnutrition: Secondary | ICD-10-CM

## 2012-02-15 DIAGNOSIS — E559 Vitamin D deficiency, unspecified: Secondary | ICD-10-CM

## 2012-02-15 DIAGNOSIS — E538 Deficiency of other specified B group vitamins: Secondary | ICD-10-CM | POA: Diagnosis not present

## 2012-02-15 DIAGNOSIS — N390 Urinary tract infection, site not specified: Secondary | ICD-10-CM

## 2012-02-15 DIAGNOSIS — Z23 Encounter for immunization: Secondary | ICD-10-CM

## 2012-02-15 LAB — HEPATIC FUNCTION PANEL
Bilirubin, Direct: 0.1 mg/dL (ref 0.0–0.3)
Total Bilirubin: 0.5 mg/dL (ref 0.3–1.2)

## 2012-02-15 LAB — VITAMIN B12: Vitamin B-12: 484 pg/mL (ref 211–911)

## 2012-02-15 NOTE — Progress Notes (Signed)
Patient ID: Angelica Wilcox, female   DOB: 1946/04/28, 66 y.o.   MRN: [id number]  Patient Active Problem List  Diagnosis  . VITAMIN B12 DEFICIENCY  . UNSPECIFIED VITAMIN D DEFICIENCY  . HYPERLIPIDEMIA  . ANXIETY DEPRESSION  . MULTIPLE SCLEROSIS  . GERD  . DIVERTICULITIS, COLON  . CONSTIPATION, CHRONIC  . CHOLELITHIASIS, ASYMPTOMATIC  . PROLAPSE, VAGINAL WALL, RECTOCELE  . OSTEOPOROSIS  . FATIGUE  . HEADACHE  . INCONTINENCE  . NONSPEC ELEVATION OF LEVELS OF TRANSAMINASE/LDH  . CONTRACTURE OF HAND JOINT  . SOMNOLENCE  . OTHER ABNORMALITY OF URINATION  . Hypertension  . UTI (lower urinary tract infection)  . Protein-calorie malnutrition, mild    Subjective:  CC:   Chief Complaint  Patient presents with  . Follow-up    3 month    HPI:   Angelica Wilcox a 66 y.o. female who presents Followup on protein malnutrition and recent urinary tract infection treated initially with Macrobid followed by change to Septra for Klebsiella isolated from culture. She's accompanied by her caregiver Mariella Saa, as patient is  wheelchair bound due to multiple sclerosis. She had an episode of vomiting and diarrhea yesterday which she attributes to her taking her him for medications out of the stomach .  Her urinary symptoms have resolved. Her diet has improved somewhat but she still has a low appetite and is not eating more than 3 meals a day. She has no history of sacral decubitus ulcers but has had severe weight loss.    Past Medical History  Diagnosis Date  . Dysthymic disorder   . Calculus of gallbladder without mention of cholecystitis or obstruction   . Other constipation   . Contracture of hand joint   . Diverticulitis of colon (without mention of hemorrhage)   . Other malaise and fatigue   . Esophageal reflux   . Headache   . Other and unspecified hyperlipidemia   . Unspecified urinary incontinence   . Unspecified essential hypertension   . Multiple sclerosis   . Nonspecific  elevation of levels of transaminase or lactic acid dehydrogenase (LDH)   . Osteoporosis, unspecified   . Other abnormality of urination   . Rectocele   . Other alteration of consciousness   . Unspecified vitamin D deficiency   . Other B-complex deficiencies     Past Surgical History  Procedure Date  . Tubal ligation   . Tonsillectomy and adenoidectomy   . Colon screen 1997  . Carotid doppler 09/2002    Negative  . Dexa 12/2008    OP, slt worse         The following portions of the patient's history were reviewed and updated as appropriate: Allergies, current medications, and problem list.    Review of Systems:   12 Pt  review of systems was negative except those addressed in the HPI,     History   Social History  . Marital Status: Widowed    Spouse Name: N/A    Number of Children: N/A  . Years of Education: N/A   Occupational History  . Disabled    Social History Main Topics  . Smoking status: Former Smoker    Quit date: 05/31/1976  . Smokeless tobacco: Never Used  . Alcohol Use: Yes     rarely  . Drug Use: Yes  . Sexually Active: Not on file   Other Topics Concern  . Not on file   Social History Narrative   Disabled from MS-wheelchair bound  Objective:  BP 110/68  Pulse 92  Temp 97.8 F (36.6 C) (Oral)  Resp 12  SpO2 95%  General appearance: alert, cooperative and appears stated age Ears: normal TM's and external ear canals both ears Throat: lips, mucosa, and tongue normal; teeth and gums normal Neck: no adenopathy, no carotid bruit, supple, symmetrical, trachea midline and thyroid not enlarged, symmetric, no tenderness/mass/nodules Back: symmetric, no curvature. ROM normal. No CVA tenderness. Lungs: clear to auscultation bilaterally Heart: regular rate and rhythm, S1, S2 normal, no murmur, click, rub or gallop Abdomen: soft, non-tender; bowel sounds normal; no masses,  no organomegaly Pulses: 2+ and symmetric Skin: Skin color, texture,  turgor normal. No rashes or lesions Lymph nodes: Cervical, supraclavicular, and axillary nodes normal.  Assessment and Plan:  UTI (lower urinary tract infection) Resolved clinically with Septra for Klebsiella that was indeterminate sensitivity to Macrobid.  Protein-calorie malnutrition, mild Her albumin was low at 3.0 left eye which is returned today for prealbumin level. I have encouraged her to increase her protein intake with several smaller meals daily. Examples given.   Updated Medication List Outpatient Encounter Prescriptions as of 02/15/2012  Medication Sig Dispense Refill  . Cholecalciferol (VITAMIN D3) 2000 UNITS TABS Take 1 tablet by mouth daily.       . Cyanocobalamin (B-12) 500 MCG SUBL Place 1 tablet under the tongue daily.  150 tablet  11  . escitalopram (LEXAPRO) 20 MG tablet TAKE ONE TABLET BY MOUTH ONE TIME DAILY  30 tablet  6  . gabapentin (NEURONTIN) 300 MG capsule Take 300 mg by mouth as directed. 2 tablets every morning, 2 tablets at 3pm, and 2 tablets at night      . HYDROcodone-acetaminophen (VICODIN) 5-500 MG per tablet Take 1 tablet by mouth every 8 (eight) hours as needed.  30 tablet  3  . lisinopril (PRINIVIL,ZESTRIL) 20 MG tablet Take 1 tablet (20 mg total) by mouth daily.  30 tablet  11  . LORazepam (ATIVAN) 0.5 MG tablet Take 1 tablet (0.5 mg total) by mouth daily as needed.  30 tablet  3  . sulfamethoxazole-trimethoprim (BACTRIM DS,SEPTRA DS) 800-160 MG per tablet Take 1 tablet by mouth 2 (two) times daily.  14 tablet  0  . DISCONTD: gabapentin (NEURONTIN) 300 MG capsule Take 1 capsule (300 mg total) by mouth as directed. 1 every morning, 1 at 3pm, and 2 at night  120 capsule  3     Orders Placed This Encounter  Procedures  . Flu vaccine greater than or equal to 3yo preservative free IM  . Hepatic function panel  . Prealbumin  . B12  . Vitamin D (25 hydroxy)    No Follow-up on file.       A diet okay the rest of okay

## 2012-02-15 NOTE — Assessment & Plan Note (Signed)
Resolved clinically with Septra for Klebsiella that was indeterminate sensitivity to Macrobid.

## 2012-02-15 NOTE — Assessment & Plan Note (Signed)
Her albumin was low at 3.0 left eye which is returned today for prealbumin level. I have encouraged her to increase her protein intake with several smaller meals daily. Examples given.

## 2012-02-15 NOTE — Patient Instructions (Addendum)
Return in 6 months,  Sooner if needed  We will call with the blood test results   Try eating every 3 hours using protein shakes, peanut butter,  Almond milk to increase your protein

## 2012-02-16 LAB — VITAMIN D 25 HYDROXY (VIT D DEFICIENCY, FRACTURES): Vit D, 25-Hydroxy: 58 ng/mL (ref 30–89)

## 2012-02-17 ENCOUNTER — Encounter: Payer: Self-pay | Admitting: Internal Medicine

## 2012-02-23 DIAGNOSIS — G35 Multiple sclerosis: Secondary | ICD-10-CM | POA: Diagnosis not present

## 2012-02-23 DIAGNOSIS — IMO0001 Reserved for inherently not codable concepts without codable children: Secondary | ICD-10-CM | POA: Diagnosis not present

## 2012-02-23 DIAGNOSIS — M6281 Muscle weakness (generalized): Secondary | ICD-10-CM | POA: Diagnosis not present

## 2012-03-01 DIAGNOSIS — N319 Neuromuscular dysfunction of bladder, unspecified: Secondary | ICD-10-CM | POA: Diagnosis not present

## 2012-03-01 DIAGNOSIS — IMO0001 Reserved for inherently not codable concepts without codable children: Secondary | ICD-10-CM | POA: Diagnosis not present

## 2012-03-01 DIAGNOSIS — G35 Multiple sclerosis: Secondary | ICD-10-CM | POA: Diagnosis not present

## 2012-03-02 DIAGNOSIS — G35 Multiple sclerosis: Secondary | ICD-10-CM | POA: Diagnosis not present

## 2012-03-02 DIAGNOSIS — N319 Neuromuscular dysfunction of bladder, unspecified: Secondary | ICD-10-CM | POA: Diagnosis not present

## 2012-03-13 DIAGNOSIS — IMO0001 Reserved for inherently not codable concepts without codable children: Secondary | ICD-10-CM | POA: Diagnosis not present

## 2012-03-13 DIAGNOSIS — N319 Neuromuscular dysfunction of bladder, unspecified: Secondary | ICD-10-CM | POA: Diagnosis not present

## 2012-03-13 DIAGNOSIS — G35 Multiple sclerosis: Secondary | ICD-10-CM | POA: Diagnosis not present

## 2012-03-27 DIAGNOSIS — G35 Multiple sclerosis: Secondary | ICD-10-CM | POA: Diagnosis not present

## 2012-03-27 DIAGNOSIS — IMO0001 Reserved for inherently not codable concepts without codable children: Secondary | ICD-10-CM | POA: Diagnosis not present

## 2012-03-27 DIAGNOSIS — N319 Neuromuscular dysfunction of bladder, unspecified: Secondary | ICD-10-CM | POA: Diagnosis not present

## 2012-04-24 ENCOUNTER — Other Ambulatory Visit: Payer: Self-pay

## 2012-04-24 NOTE — Telephone Encounter (Signed)
Refill request for Hydrocodone Acetaminophen 5-500 mg. Ok to refill? 

## 2012-04-25 ENCOUNTER — Other Ambulatory Visit: Payer: Self-pay

## 2012-04-25 MED ORDER — HYDROCODONE-ACETAMINOPHEN 5-500 MG PO TABS: 1.0000 | ORAL_TABLET | Freq: Three times a day (TID) | ORAL | Status: DC | PRN

## 2012-04-25 NOTE — Telephone Encounter (Signed)
Hydrocodone Acetaminophen faxed to [facility]

## 2012-05-22 DIAGNOSIS — G35 Multiple sclerosis: Secondary | ICD-10-CM | POA: Diagnosis not present

## 2012-05-22 DIAGNOSIS — M62838 Other muscle spasm: Secondary | ICD-10-CM | POA: Diagnosis not present

## 2012-06-01 ENCOUNTER — Telehealth: Payer: Self-pay | Admitting: *Deleted

## 2012-06-01 ENCOUNTER — Other Ambulatory Visit (INDEPENDENT_AMBULATORY_CARE_PROVIDER_SITE_OTHER): Payer: Medicare Other

## 2012-06-01 DIAGNOSIS — N390 Urinary tract infection, site not specified: Secondary | ICD-10-CM

## 2012-06-01 LAB — POCT URINALYSIS DIPSTICK
Bilirubin, UA: NEGATIVE
Glucose, UA: NEGATIVE
Ketones, UA: NEGATIVE
Protein, UA: NEGATIVE
Spec Grav, UA: 1.015

## 2012-06-01 MED ORDER — NITROFURANTOIN MONOHYD MACRO 100 MG PO CAPS: 100.0000 mg | ORAL_CAPSULE | Freq: Two times a day (BID) | ORAL | Status: DC

## 2012-06-01 NOTE — Telephone Encounter (Signed)
RX refill

## 2012-06-01 NOTE — Telephone Encounter (Signed)
Patient notified of need to take medication for UTI. Medication called in to [facility].

## 2012-06-01 NOTE — Telephone Encounter (Signed)
Message copied by Angelica Wilcox on Thu Jun 01, 2012  4:40 PM ------      Message from: Angelica Wilcox      Created: Thu Jun 01, 2012  3:54 PM       A third and final message about this patient. She has a history of adverse reactions to Cipro and Bactrim so I have pointed out a prescription for Macrobid. [facility]

## 2012-06-01 NOTE — Telephone Encounter (Signed)
Pt would like meds to be call to [facility] at [phone]

## 2012-06-01 NOTE — Addendum Note (Signed)
Addended by: Sherlene Shams on: 06/01/2012 03:54 PM   Modules accepted: Orders

## 2012-06-01 NOTE — Telephone Encounter (Signed)
There was no specific refill request attached to this order.   patient needs to contact her pharmacy and asked him to submit refill requests.

## 2012-06-02 ENCOUNTER — Telehealth: Payer: Self-pay | Admitting: Internal Medicine

## 2012-06-02 MED ORDER — NITROFURANTOIN MONOHYD MACRO 100 MG PO CAPS: 100.0000 mg | ORAL_CAPSULE | Freq: Two times a day (BID) | ORAL | Status: DC

## 2012-06-02 NOTE — Telephone Encounter (Signed)
Hard copy of Rx given to daughter of patient.

## 2012-06-02 NOTE — Telephone Encounter (Signed)
This was done yesterday but pt is saying that her [facility] never received the rx that was supposed to be sent in ???   Elnora Morrison, LPN 06/05/1094 0:45 PM Signed  Patient notified of need to take medication for UTI. Medication called in to [facility]. Elnora Morrison, LPN 4/0/9811 9:14 PM Signed  Message copied by Elnora Morrison on Thu Jun 01, 2012 4:40 PM  ------  Message from: Sherlene Shams  Created: Thu Jun 01, 2012 3:54 PM  A third and final message about this patient. She has a history of adverse reactions to Cipro and Bactrim so I have pointed out a prescription for Macrobid. [facility]

## 2012-06-03 LAB — URINE CULTURE

## 2012-06-06 ENCOUNTER — Telehealth: Payer: Self-pay | Admitting: *Deleted

## 2012-06-06 ENCOUNTER — Other Ambulatory Visit (INDEPENDENT_AMBULATORY_CARE_PROVIDER_SITE_OTHER): Payer: Medicare Other

## 2012-06-06 DIAGNOSIS — N390 Urinary tract infection, site not specified: Secondary | ICD-10-CM | POA: Diagnosis not present

## 2012-06-06 LAB — POCT URINALYSIS DIPSTICK
Bilirubin, UA: NEGATIVE
Glucose, UA: NEGATIVE
Ketones, UA: NEGATIVE
Nitrite, UA: POSITIVE
pH, UA: 5.5

## 2012-06-06 NOTE — Telephone Encounter (Signed)
For this pt would you like a urine culture? Thank you 

## 2012-06-06 NOTE — Telephone Encounter (Signed)
Yes, thanks

## 2012-06-08 LAB — URINE CULTURE

## 2012-06-08 MED ORDER — ONDANSETRON HCL 4 MG PO TABS: 4.0000 mg | ORAL_TABLET | Freq: Three times a day (TID) | ORAL | Status: DC | PRN

## 2012-06-08 MED ORDER — LEVOFLOXACIN 500 MG PO TABS: 500.0000 mg | ORAL_TABLET | Freq: Every day | ORAL | Status: DC

## 2012-06-08 NOTE — Addendum Note (Signed)
Addended by: Sherlene Shams on: 06/08/2012 11:43 PM   Modules accepted: Orders

## 2012-06-09 ENCOUNTER — Telehealth: Payer: Self-pay | Admitting: General Practice

## 2012-06-09 ENCOUNTER — Other Ambulatory Visit: Payer: Self-pay | Admitting: General Practice

## 2012-06-09 MED ORDER — GABAPENTIN 300 MG PO CAPS: 300.0000 mg | ORAL_CAPSULE | ORAL | Status: DC

## 2012-06-09 NOTE — Telephone Encounter (Signed)
See lab note and pall catient back

## 2012-06-09 NOTE — Telephone Encounter (Signed)
Needing results of her urine test . Call her nurse Millecent Raynor # [phone].

## 2012-06-09 NOTE — Telephone Encounter (Signed)
Pt called stating that her Gabapentin Rx has been increased to: 2 300mg  capsules 3 times a day by [employer]. Pt is wondering if we could fill this this month. Due to [facility] not getting back to her. Pt will not have any meds if this is not filled today. Please advise.

## 2012-06-10 NOTE — Telephone Encounter (Signed)
Pt nurse notified on 06/09/12.

## 2012-08-15 ENCOUNTER — Ambulatory Visit: Payer: Medicare Other | Admitting: Internal Medicine

## 2012-08-17 ENCOUNTER — Telehealth: Payer: Self-pay | Admitting: *Deleted

## 2012-08-17 MED ORDER — HYDROCODONE-ACETAMINOPHEN 5-325 MG PO TABS: 1.0000 | ORAL_TABLET | Freq: Four times a day (QID) | ORAL | Status: DC | PRN

## 2012-08-17 NOTE — Telephone Encounter (Signed)
Ok to refill, with changes made to strength .   Can call or Fax printed rx to pharmacy once signed

## 2012-08-17 NOTE — Telephone Encounter (Signed)
[facility] called about patients Vicodin 5-500. Drug company has taken 5-500 mg off the market. The only other choices is 5-325 mg or 5-300 mg. Pharmacist wants to know which do you want substituted? Please call back with dose.

## 2012-08-17 NOTE — Addendum Note (Signed)
Addended by: Sherlene Shams on: 08/17/2012 04:02 PM   Modules accepted: Orders

## 2012-08-17 NOTE — Telephone Encounter (Signed)
See misrouted message

## 2012-08-18 NOTE — Telephone Encounter (Signed)
Rx faxed on 3/21.

## 2012-08-22 ENCOUNTER — Other Ambulatory Visit (INDEPENDENT_AMBULATORY_CARE_PROVIDER_SITE_OTHER): Payer: Medicare Other | Admitting: *Deleted

## 2012-08-22 DIAGNOSIS — N390 Urinary tract infection, site not specified: Secondary | ICD-10-CM

## 2012-08-22 LAB — POCT URINALYSIS DIPSTICK
Bilirubin, UA: NEGATIVE
Ketones, UA: NEGATIVE
Leukocytes, UA: NEGATIVE
Nitrite, UA: NEGATIVE

## 2012-08-23 ENCOUNTER — Encounter: Payer: Self-pay | Admitting: Internal Medicine

## 2012-08-24 ENCOUNTER — Encounter: Payer: Self-pay | Admitting: Internal Medicine

## 2012-08-24 ENCOUNTER — Ambulatory Visit (INDEPENDENT_AMBULATORY_CARE_PROVIDER_SITE_OTHER): Payer: Medicare Other | Admitting: Internal Medicine

## 2012-08-24 VITALS — BP 118/72 | HR 67 | Temp 97.6°F | Resp 16

## 2012-08-24 DIAGNOSIS — R413 Other amnesia: Secondary | ICD-10-CM

## 2012-08-24 DIAGNOSIS — R4189 Other symptoms and signs involving cognitive functions and awareness: Secondary | ICD-10-CM

## 2012-08-24 DIAGNOSIS — F341 Dysthymic disorder: Secondary | ICD-10-CM

## 2012-08-24 DIAGNOSIS — R5383 Other fatigue: Secondary | ICD-10-CM | POA: Diagnosis not present

## 2012-08-24 DIAGNOSIS — N816 Rectocele: Secondary | ICD-10-CM

## 2012-08-24 DIAGNOSIS — N3090 Cystitis, unspecified without hematuria: Secondary | ICD-10-CM

## 2012-08-24 DIAGNOSIS — E538 Deficiency of other specified B group vitamins: Secondary | ICD-10-CM | POA: Diagnosis not present

## 2012-08-24 DIAGNOSIS — E785 Hyperlipidemia, unspecified: Secondary | ICD-10-CM

## 2012-08-24 DIAGNOSIS — R5381 Other malaise: Secondary | ICD-10-CM | POA: Diagnosis not present

## 2012-08-24 DIAGNOSIS — E559 Vitamin D deficiency, unspecified: Secondary | ICD-10-CM

## 2012-08-24 LAB — COMPREHENSIVE METABOLIC PANEL
AST: 20 U/L (ref 0–37)
Albumin: 4.2 g/dL (ref 3.5–5.2)
Alkaline Phosphatase: 50 U/L (ref 39–117)
BUN: 14 mg/dL (ref 6–23)
Glucose, Bld: 87 mg/dL (ref 70–99)
Potassium: 4.5 mEq/L (ref 3.5–5.1)
Sodium: 139 mEq/L (ref 135–145)
Total Bilirubin: 0.3 mg/dL (ref 0.3–1.2)
Total Protein: 7.1 g/dL (ref 6.0–8.3)

## 2012-08-24 LAB — LDL CHOLESTEROL, DIRECT: Direct LDL: 249.6 mg/dL

## 2012-08-24 LAB — VITAMIN B12: Vitamin B-12: 729 pg/mL (ref 211–911)

## 2012-08-24 MED ORDER — BUPROPION HCL ER (SR) 150 MG PO TB12: 150.0000 mg | ORAL_TABLET | Freq: Two times a day (BID) | ORAL | Status: DC

## 2012-08-24 MED ORDER — GABAPENTIN 300 MG PO CAPS: 600.0000 mg | ORAL_CAPSULE | Freq: Three times a day (TID) | ORAL | Status: DC

## 2012-08-24 NOTE — Progress Notes (Signed)
Patient ID: Angelica Wilcox, female   DOB: [birth year], 67 y.o.   MRN: [id number]   Patient Active Problem List  Diagnosis  . VITAMIN B12 DEFICIENCY  . Unspecified vitamin D deficiency  . HYPERLIPIDEMIA  . ANXIETY DEPRESSION  . MULTIPLE SCLEROSIS  . GERD  . DIVERTICULITIS, COLON  . CONSTIPATION, CHRONIC  . CHOLELITHIASIS, ASYMPTOMATIC  . PROLAPSE, VAGINAL WALL, RECTOCELE  . OSTEOPOROSIS  . FATIGUE  . HEADACHE  . INCONTINENCE  . NONSPEC ELEVATION OF LEVELS OF TRANSAMINASE/LDH  . CONTRACTURE OF HAND JOINT  . SOMNOLENCE  . OTHER ABNORMALITY OF URINATION  . Hypertension  . UTI (lower urinary tract infection)  . Protein-calorie malnutrition, mild  . Recurrent cystitis  . Memory changes    Subjective:  CC:   Chief Complaint  Patient presents with  . Follow-up    HPI:   Angelica Newsham Sanfordis a 67 y.o. female who presents  For a  6 month follow up on multiple conditions including multiple sclerosis with paraplegia, recurrent dysuria and urinary incontinence complicated by fecal constipation and delayed transit  She has been having recurrent dysuria secondary to trouble managing stooling, requiring manual evacuation which often results in cross contamination. However her recent urinalysis was normal.   Loss of short-term memory. Patient states that for the last several months she has been noticing some memory issues. They have become more pronounced recently. She is dropping her thoughts in mid sentence she touches of blanking out of finishing her sentences when she is put on a spot. She is forgetting the names of people she knows well. She has trouble remembering recent conversations that occurred in the same day. She often enters her room in her house and forgets why she had gone there. She has not seen her neurologist in [place] in over 6 months. She had brought this up with him in the past and there was no workup done as her symptoms are mild and attributed to her  medications and her disease.Marland Kitchen     Past Medical History  Diagnosis Date  . Dysthymic disorder   . Calculus of gallbladder without mention of cholecystitis or obstruction   . Other constipation   . Contracture of hand joint   . Diverticulitis of colon (without mention of hemorrhage)   . Other malaise and fatigue   . Esophageal reflux   . Headache   . Other and unspecified hyperlipidemia   . Unspecified urinary incontinence   . Unspecified essential hypertension   . Multiple sclerosis   . Nonspecific elevation of levels of transaminase or lactic acid dehydrogenase (LDH)   . Osteoporosis, unspecified   . Other abnormality of urination   . Rectocele   . Other alteration of consciousness   . Unspecified vitamin D deficiency   . Other B-complex deficiencies     Past Surgical History  Procedure Laterality Date  . Tubal ligation    . Tonsillectomy and adenoidectomy    . Colon screen  1997  . Carotid doppler  09/2002    Negative  . Dexa  12/2008    OP, slt worse  . Baclofen pump refill  4-24/13    model No. [id number] @ [facility]       The following portions of the patient's history were reviewed and updated as appropriate: Allergies, current medications, and problem list.    Review of Systems:   Patient denies headache, fevers, malaise, unintentional weight loss, skin rash, eye pain, sinus congestion and sinus pain, sore throat, dysphagia,  hemoptysis , cough, dyspnea, wheezing, chest pain, palpitations, orthopnea, edema, abdominal pain, nausea, melena, diarrhea, , flank pain hematuria, , nocturia, numbness, tingling, seizures,  , Loss of consciousness,  Tremor, insomnia,and suicidal ideation.      History   Social History  . Marital Status: Widowed    Spouse Name: N/A    Number of Children: N/A  . Years of Education: N/A   Occupational History  . Disabled    Social History Main Topics  . Smoking status: Former Smoker    Quit date: 05/31/1976  . Smokeless tobacco: Never  Used  . Alcohol Use: Yes     Comment: rarely  . Drug Use: Yes  . Sexually Active: Not on file   Other Topics Concern  . Not on file   Social History Narrative   Disabled from MS-wheelchair bound    Objective:  BP 118/72  Pulse 67  Temp(Src) 97.6 F (36.4 C) (Oral)  Resp 16  SpO2 98%  General appearance: alert, cooperative and appears stated age Ears: normal TM's and external ear canals both ears Throat: lips, mucosa, and tongue normal; teeth and gums normal Neck: no adenopathy, no carotid bruit, supple, symmetrical, trachea midline and thyroid not enlarged, symmetric, no tenderness/mass/nodules Back: symmetric, no curvature. ROM normal. No CVA tenderness. Lungs: clear to auscultation bilaterally Heart: regular rate and rhythm, S1, S2 normal, no murmur, click, rub or gallop Abdomen: soft, non-tender; bowel sounds normal; no masses,  no organomegaly Pulses: 2+ and symmetric Skin: Skin color, texture, turgor normal. No rashes or lesions Lymph nodes: Cervical, supraclavicular, and axillary nodes normal.  Assessment and Plan:  PROLAPSE, VAGINAL WALL, RECTOCELE Her rectocele is becoming increasingly more difficult to manage due to her inability to defecate without manual disimpaction. This is more is is difficult for someone who is not a paraplegic. We discussed diverting colostomy and suprapubic catheter today as this has been discussed with her in the past. She's not yet ready to have this procedure.  Recurrent cystitis She has been having to to 3 UTIs per year. Her last urinalysis 2 days ago was normal. We discussed the need for her to considerundergoing diverting  colostomy and suprapubic catheter when she starts having 4 or more UTIs per year given the increased risk of becoming septic from untreated condition.  Memory changes We discussed the etiology of her changes which include untreated anxiety/depression, vascular dementia, Alzheimer's dementia, versus side effect of  medications. We decided to check B12 and thyroid function again. MRI of brain has not been done in over a year. Before ordering an MRI we discussed treating her anxiety/depression with an alternative to current SSRI and having her recurrent return in one-month for in Mini-Mental Status exam. She is instructed to start Wellbutrin and begin her Lexapro taper.  ANXIETY DEPRESSION Switching from Lexapro to Wellbutrin for a trial today given her concurrent cognitive dysfunction.   Updated Medication List Outpatient Encounter Prescriptions as of 08/24/2012  Medication Sig Dispense Refill  . BACLOFEN IT 65.07 mcg model No. [id number] implant date 09/22/11 At [facility]      . Cholecalciferol (VITAMIN D3) 2000 UNITS TABS Take 1 tablet by mouth daily.       . Cyanocobalamin (B-12) 500 MCG SUBL Place 1 tablet under the tongue daily.  150 tablet  11  . escitalopram (LEXAPRO) 20 MG tablet TAKE ONE TABLET BY MOUTH ONE TIME DAILY  30 tablet  6  . gabapentin (NEURONTIN) 300 MG capsule Take 2 capsules (600 mg total) by  mouth 3 (three) times daily. 2 tablets every morning, 2 tablets at 3pm, and 2 tablets at night  180 capsule  11  . HYDROcodone-acetaminophen (NORCO/VICODIN) 5-325 MG per tablet Take 1 tablet by mouth every 6 (six) hours as needed for pain.  30 tablet  3  . lisinopril (PRINIVIL,ZESTRIL) 20 MG tablet Take 1 tablet (20 mg total) by mouth daily.  30 tablet  11  . LORazepam (ATIVAN) 0.5 MG tablet Take 1 tablet (0.5 mg total) by mouth daily as needed.  30 tablet  3  . [DISCONTINUED] gabapentin (NEURONTIN) 300 MG capsule Take 1 capsule (300 mg total) by mouth as directed. 2 tablets every morning, 2 tablets at 3pm, and 2 tablets at night  30 capsule  0  . [DISCONTINUED] gabapentin (NEURONTIN) 300 MG capsule 2 tablets every morning, 2 tablets at 3pm, and 2 tablets at night      . buPROPion (WELLBUTRIN SR) 150 MG 12 hr tablet Take 1 tablet (150 mg total) by mouth 2 (two) times daily.  60 tablet  1  . ondansetron  (ZOFRAN) 4 MG tablet Take 1 tablet (4 mg total) by mouth every 8 (eight) hours as needed for nausea.  20 tablet  0  . [DISCONTINUED] levofloxacin (LEVAQUIN) 500 MG tablet Take 1 tablet (500 mg total) by mouth daily.  7 tablet  0  . [DISCONTINUED] nitrofurantoin, macrocrystal-monohydrate, (MACROBID) 100 MG capsule Take 1 capsule (100 mg total) by mouth 2 (two) times daily.  6 capsule  0   No facility-administered encounter medications on file as of 08/24/2012.     Orders Placed This Encounter  Procedures  . Vitamin B12  . TSH  . Comprehensive metabolic panel  . LDL cholesterol, direct    No Follow-up on file.

## 2012-08-24 NOTE — Patient Instructions (Addendum)
We are changing lexapro to wellbutrin.  continue lexapro for one week while starting the wellbutin,  Then stop it

## 2012-08-25 ENCOUNTER — Telehealth: Payer: Self-pay | Admitting: General Practice

## 2012-08-25 ENCOUNTER — Encounter: Payer: Self-pay | Admitting: *Deleted

## 2012-08-25 ENCOUNTER — Encounter: Payer: Self-pay | Admitting: Internal Medicine

## 2012-08-25 NOTE — Telephone Encounter (Signed)
I sent her a message along with her labs.  If she did not get the message, please find out.  I am duplicating my efforts.

## 2012-08-25 NOTE — Telephone Encounter (Signed)
Pt is suppose to be allergic to statins and noticed a high LDL cholesterol reading on her recent labs in myChart. Please advise if something needs to be called in to [facility] regarding this.

## 2012-08-26 ENCOUNTER — Encounter: Payer: Self-pay | Admitting: Internal Medicine

## 2012-08-26 DIAGNOSIS — R419 Unspecified symptoms and signs involving cognitive functions and awareness: Secondary | ICD-10-CM | POA: Insufficient documentation

## 2012-08-26 DIAGNOSIS — N3090 Cystitis, unspecified without hematuria: Secondary | ICD-10-CM | POA: Insufficient documentation

## 2012-08-26 NOTE — Assessment & Plan Note (Addendum)
We discussed the etiology of her changes which include untreated anxiety/depression, vascular dementia, Alzheimer's dementia, versus side effect of medications. We decided to check B12 and thyroid function again. MRI of brain has not been done in over a year. Before ordering an MRI we discussed treating her anxiety/depression with an alternative to current SSRI and having her recurrent return in one-month for in Mini-Mental Status exam. She is instructed to start Wellbutrin and begin her Lexapro taper.

## 2012-08-26 NOTE — Assessment & Plan Note (Signed)
She has been having to to 3 UTIs per year. Her last urinalysis 2 days ago was normal. We discussed the need for her to considerundergoing diverting  colostomy and suprapubic catheter when she starts having 4 or more UTIs per year given the increased risk of becoming septic from untreated condition.

## 2012-08-26 NOTE — Assessment & Plan Note (Signed)
Her rectocele is becoming increasingly more difficult to manage due to her inability to defecate without manual disimpaction. This is more is is difficult for someone who is not a paraplegic. We discussed diverting colostomy and suprapubic catheter today as this has been discussed with her in the past. She's not yet ready to have this procedure.

## 2012-08-26 NOTE — Assessment & Plan Note (Signed)
Switching from Lexapro to Wellbutrin for a trial today given her concurrent cognitive dysfunction.

## 2012-08-29 NOTE — Telephone Encounter (Signed)
Pt did receive the message on MyChart, just did not respond by that method. Pt stated this morning that she had tried atorvastatin in the past caused myalgia. Pt stated today that "she chooses her battles due to all of her health concerns, and this is not one she is to concerned about or willing to engage in at this time."

## 2012-08-29 NOTE — Telephone Encounter (Signed)
Cannot find the attachment to the labs. Angelica Wilcox is looking into this matter.

## 2012-09-07 ENCOUNTER — Other Ambulatory Visit: Payer: Self-pay | Admitting: Internal Medicine

## 2012-09-14 ENCOUNTER — Ambulatory Visit (INDEPENDENT_AMBULATORY_CARE_PROVIDER_SITE_OTHER): Payer: Medicare Other | Admitting: Adult Health

## 2012-09-14 ENCOUNTER — Encounter: Payer: Self-pay | Admitting: Adult Health

## 2012-09-14 VITALS — BP 130/72 | HR 92 | Temp 97.7°F | Resp 14 | Wt 150.0 lb

## 2012-09-14 DIAGNOSIS — G35D Multiple sclerosis, unspecified: Secondary | ICD-10-CM

## 2012-09-14 DIAGNOSIS — G35 Multiple sclerosis: Secondary | ICD-10-CM | POA: Diagnosis not present

## 2012-09-14 MED ORDER — LORAZEPAM 0.5 MG PO TABS: 0.5000 mg | ORAL_TABLET | Freq: Every day | ORAL | Status: DC | PRN

## 2012-09-14 NOTE — Patient Instructions (Addendum)
  Use pillow between your knees to prevent friction and pressure that could lead to a pressure ulcer.  I am referring to Home Health for Occupational Therapy  Clinitron Bed are used for patients with decreased mobility and high risk for pressure ulcers.

## 2012-09-15 ENCOUNTER — Encounter: Payer: Self-pay | Admitting: Adult Health

## 2012-09-15 DIAGNOSIS — L8990 Pressure ulcer of unspecified site, unspecified stage: Secondary | ICD-10-CM | POA: Insufficient documentation

## 2012-09-15 NOTE — Progress Notes (Signed)
  Subjective:    Patient ID: Angelica Wilcox, female    DOB: 10-Aug-1945, 67 y.o.   MRN: [id number]  HPI  Patient is a pleasant 67 year old female with history of multiple sclerosis who presents to clinic with concerns of possible pressure sore on her right knee. She has noticed redness on the top portion of her right knee approximately 2 weeks ago. Patient also reports that this area has improved.  Patient has been experiencing decreased ability to make transfers. She was able to transfer from her motorized chair to the toilet; however, she is currently not able to do this.  Patient is also requesting a refill on her lorazepam.   Current Outpatient Prescriptions on File Prior to Visit  Medication Sig Dispense Refill  . BACLOFEN IT 65.07 mcg model No. [id number] implant date 09/22/11 At [facility]      . buPROPion (WELLBUTRIN SR) 150 MG 12 hr tablet Take 1 tablet (150 mg total) by mouth 2 (two) times daily.  60 tablet  1  . Cholecalciferol (VITAMIN D3) 2000 UNITS TABS Take 1 tablet by mouth daily.       . Cyanocobalamin (B-12) 500 MCG SUBL Place 1 tablet under the tongue daily.  150 tablet  11  . gabapentin (NEURONTIN) 300 MG capsule Take 2 capsules (600 mg total) by mouth 3 (three) times daily. 2 tablets every morning, 2 tablets at 3pm, and 2 tablets at night  180 capsule  11  . HYDROcodone-acetaminophen (NORCO/VICODIN) 5-325 MG per tablet Take 1 tablet by mouth every 6 (six) hours as needed for pain.  30 tablet  3  . lisinopril (PRINIVIL,ZESTRIL) 20 MG tablet Take 1 tablet (20 mg total) by mouth daily.  30 tablet  11  . ondansetron (ZOFRAN) 4 MG tablet Take 1 tablet (4 mg total) by mouth every 8 (eight) hours as needed for nausea.  20 tablet  0   No current facility-administered medications on file prior to visit.     Review of Systems  Skin: Negative for rash and wound.       Redness on the top portion of right knee.  Neurological: Positive for weakness.     BP 130/72  Pulse 92   Temp(Src) 97.7 F (36.5 C) (Oral)  Resp 14  Wt 150 lb (68.04 kg)  BMI 26.58 kg/m2  SpO2 96%    Objective:   Physical Exam  Constitutional: She is oriented to person, place, and time.  Neurological: She is alert and oriented to person, place, and time.  Patient has bed to chair existence secondary to MS. Increasing weakness with now inability to transfer without assistance.  Skin: Skin is warm and dry.  Small area of redness above right knee. There is no blanching. During the night, her left leg rests on the right knee.  Psychiatric: She has a normal mood and affect. Her behavior is normal. Judgment and thought content normal.          Assessment & Plan:

## 2012-09-15 NOTE — Assessment & Plan Note (Signed)
Redness above right knee caused by friction from left leg resting on it during the night. Instructed to use a pillow between the knees to prevent rubbing. She is also experiencing increased weakness and inability to change positions. Will order OT referral for evaluation of home setting and further recommendations on helping her stay in her home. She may also benefit from a Clinitron bed to prevent skin breakdown.

## 2012-09-18 ENCOUNTER — Telehealth: Payer: Self-pay | Admitting: Internal Medicine

## 2012-09-18 DIAGNOSIS — G35 Multiple sclerosis: Secondary | ICD-10-CM | POA: Diagnosis not present

## 2012-09-18 NOTE — Telephone Encounter (Signed)
The wellbutrin can  Be weaned off by reducing dose to one tablet daily for one week then one tablet every other day for one week,  Then stop

## 2012-09-18 NOTE — Telephone Encounter (Signed)
Patient Information:  Caller Name: Angelica Wilcox  Phone: [phone]  Patient: Angelica Wilcox, Angelica Wilcox  Gender: Female  DOB: 1946/02/22  Age: 67 Years  PCP: Duncan Dull (Adults only)  Office Follow Up:  Does the office need to follow up with this patient?: Yes  Instructions For The Office: PLEASE CONTACT DAUGHTER FOR CONCERNS.  RN Note:  Per EPIC Note- FROM 08/24/12--Memory changes We discussed the etiology of her changes which include untreated anxiety/depression, vascular dementia, Alzheimer's dementia, versus side effect of medications. We decided to check B12 and thyroid function again. MRI of brain has not been done in over a year. Before ordering an MRI we discussed treating her anxiety/depression with an alternative to current SSRI and having her recurrent return in one-month for in Mini-Mental Status exam. She is instructed to start Wellbutrin and begin her Lexapro taper.   ANXIETY DEPRESSION Switching from Lexapro to Wellbutrin for a trial today given her concurrent cognitive dysfunction  PLEASE CONTACT DAUGHTER - DUE TO MOTHERS MEMORY.  Symptoms  Reason For Call & Symptoms: Daughter is calling concerning her mother.  She has MS since 1988. She was placed on Wellbutrin recently.  Daughter states over the two weeks had declined rapidly.  She can no longer sit up , arm weeks, cannot transfer from chair to toliet.  Her mother told her this all started after starting the Wellbutrin and stopping Lexapro.  Patient wants to stop the Wellbutrin now!  Daughter is concerned about medication and her mother stopping the Wellbutrin. She has an appt on Thursday 09/21/12 . Daughter states she decreasing quickley  Reviewed Health History In EMR: Yes  Reviewed Medications In EMR: Yes  Reviewed Allergies In EMR: Yes  Reviewed Surgeries / Procedures: Yes  Date of Onset of Symptoms: 09/11/2012  Guideline(s) Used:  No Protocol Available - Sick Adult  Disposition Per Guideline:   Discuss with PCP and  Callback by Nurse Today  Reason For Disposition Reached:   Nursing judgment  Advice Given:  Call Back If:  New symptoms develop  You become worse.  Patient Will Follow Care Advice:  YES

## 2012-09-18 NOTE — Telephone Encounter (Signed)
Please Advise

## 2012-09-18 NOTE — Telephone Encounter (Signed)
Patient notified as instructed by phone. 

## 2012-09-18 NOTE — Telephone Encounter (Signed)
Caller: Alivea/Patient; Phone: [phone]; Reason for Call: Pt states she intend NOT to take the Wellbutrin 150 mg tonight and will ween herself off this medication.  Pt feels her decline with the MS can be attributed to the Wellbutrin.  PLEASE F/U WITH PT TO ADVISE HER HOW TO WEEN HERSELF OF THE MEDICATION IF POSSIBLE MY THE END OF THE DAY TODAY PER DR TULLOS RECOMMENDATIONS.  THANK YOU.

## 2012-09-18 NOTE — Telephone Encounter (Signed)
Please advise 

## 2012-09-21 ENCOUNTER — Ambulatory Visit (INDEPENDENT_AMBULATORY_CARE_PROVIDER_SITE_OTHER): Payer: Medicare Other | Admitting: Internal Medicine

## 2012-09-21 ENCOUNTER — Encounter: Payer: Self-pay | Admitting: Internal Medicine

## 2012-09-21 VITALS — BP 106/62 | HR 104 | Temp 98.1°F | Resp 14

## 2012-09-21 DIAGNOSIS — G35 Multiple sclerosis: Secondary | ICD-10-CM

## 2012-09-21 DIAGNOSIS — R413 Other amnesia: Secondary | ICD-10-CM | POA: Diagnosis not present

## 2012-09-21 DIAGNOSIS — I10 Essential (primary) hypertension: Secondary | ICD-10-CM

## 2012-09-21 DIAGNOSIS — N319 Neuromuscular dysfunction of bladder, unspecified: Secondary | ICD-10-CM | POA: Diagnosis not present

## 2012-09-21 DIAGNOSIS — E785 Hyperlipidemia, unspecified: Secondary | ICD-10-CM | POA: Diagnosis not present

## 2012-09-21 DIAGNOSIS — F341 Dysthymic disorder: Secondary | ICD-10-CM

## 2012-09-21 MED ORDER — ESCITALOPRAM OXALATE 20 MG PO TABS: 20.0000 mg | ORAL_TABLET | Freq: Every day | ORAL | Status: DC

## 2012-09-21 NOTE — Progress Notes (Signed)
Patient ID: Angelica Wilcox, female   DOB: Oct 20, 1945, 67 y.o.   MRN: [id number]  Patient Active Problem List  Diagnosis  . VITAMIN B12 DEFICIENCY  . Unspecified vitamin D deficiency  . HYPERLIPIDEMIA  . ANXIETY DEPRESSION  . Multiple sclerosis, primary progressive  . GERD  . DIVERTICULITIS, COLON  . CONSTIPATION, CHRONIC  . CHOLELITHIASIS, ASYMPTOMATIC  . PROLAPSE, VAGINAL WALL, RECTOCELE  . OSTEOPOROSIS  . FATIGUE  . Neurogenic bladder  . NONSPEC ELEVATION OF LEVELS OF TRANSAMINASE/LDH  . CONTRACTURE OF HAND JOINT  . SOMNOLENCE  . OTHER ABNORMALITY OF URINATION  . Hypertension  . UTI (lower urinary tract infection)  . Protein-calorie malnutrition, mild  . Recurrent cystitis  . Memory changes  . Pressure sore    Subjective:  CC:   Chief Complaint  Patient presents with  . Follow-up    HPI: Followup on depressive symptoms and primary progressive multiple sclerosis with paraplegia and spasticity requiring electric wheelchair and placement of an intrathecal baclofen pump.. At last visit on March 27 her antidepressant therapy was changed from Lexapro to Wellbutrin for her new concern of cognitive dysfunction and decreased concentration. For the last several weeks She and her family have noticed a progressive  decline in function  which they are attributing to Wellbutrin. . She's noticed that her left side has become nearly flaccid and she can no longer transfer without maximal assistance. . She  began a taper off of the medication one week ago as directed by me and thinks that her symptoms may be improving. However she continues to feel groggy. Yesterday she had an episode of nausea however and took Phenergan which may be complicating the mental status changes.  she has decided to have a suprapubic urostomy placed for management of neurogenic bladder. She is followed by urology at [facility] and anticipates this procedure to be done within the next month. She is also requesting referral  to another neurologist closer to home since her neurologist is retiring from [facility]. She does not want to go to the [facility] and is willing to go to [place]. She's accompanied by her daughter and her caregiver today.    Angelica Pedro Sanfordis a 67 y.o. female who presents    Past Medical History  Diagnosis Date  . Dysthymic disorder   . Calculus of gallbladder without mention of cholecystitis or obstruction   . Other constipation   . Contracture of hand joint   . Diverticulitis of colon (without mention of hemorrhage)   . Other malaise and fatigue   . Esophageal reflux   . Headache   . Other and unspecified hyperlipidemia   . Unspecified urinary incontinence   . Unspecified essential hypertension   . Multiple sclerosis   . Nonspecific elevation of levels of transaminase or lactic acid dehydrogenase (LDH)   . Osteoporosis, unspecified   . Other abnormality of urination   . Rectocele   . Other alteration of consciousness   . Unspecified vitamin D deficiency   . Other B-complex deficiencies     Past Surgical History  Procedure Laterality Date  . Tubal ligation    . Tonsillectomy and adenoidectomy    . Colon screen  1997  . Carotid doppler  09/2002    Negative  . Dexa  12/2008    OP, slt worse  . Baclofen pump refill  4-24/13    model No. [id number] @ [facility]       The following portions of the patient's history were reviewed and  updated as appropriate: Allergies, current medications, and problem list.    Review of Systems:   Patient denies headache, fevers, malaise, unintentional weight loss, skin rash, eye pain, sinus congestion and sinus pain, sore throat, dysphagia,  hemoptysis , cough, dyspnea, wheezing, chest pain, palpitations, orthopnea, edema, abdominal pain, nausea, melena, diarrhea, constipation, flank pain, dysuria, hematuria, urinary  Frequency, nocturia, numbness, tingling, seizures,  Focal weakness, Loss of consciousness,  Tremor, insomnia, depression, anxiety,  and suicidal ideation.     History   Social History  . Marital Status: Widowed    Spouse Name: N/A    Number of Children: N/A  . Years of Education: N/A   Occupational History  . Disabled    Social History Main Topics  . Smoking status: Former Smoker    Quit date: 05/31/1976  . Smokeless tobacco: Never Used  . Alcohol Use: Yes     Comment: rarely  . Drug Use: Yes  . Sexually Active: Not on file   Other Topics Concern  . Not on file   Social History Narrative   Disabled from MS-wheelchair bound    Objective:  BP 106/62  Pulse 104  Temp(Src) 98.1 F (36.7 C) (Oral)  Resp 14  SpO2 98%  General appearance: alert, cooperative and appears stated age Ears: normal TM's and external ear canals both ears Throat: lips, mucosa, and tongue normal; teeth and gums normal Neck: no adenopathy, no carotid bruit, supple, symmetrical, trachea midline and thyroid not enlarged, symmetric, no tenderness/mass/nodules Back: symmetric, no curvature. ROM normal. No CVA tenderness. Lungs: clear to auscultation bilaterally Heart: regular rate and rhythm, S1, S2 normal, no murmur, click, rub or gallop Abdomen: soft, non-tender; bowel sounds normal; no masses,  no organomegaly Pulses: 2+ and symmetric Skin: Skin color, texture, turgor normal. No rashes or lesions Lymph nodes: Cervical, supraclavicular, and axillary nodes normal.  Assessment and Plan:  Multiple sclerosis, primary progressive Progressive, with complete loss of mobility and spasticity now managed with baclofen pump.  She requires maximal assistance with transfers in and out of wheelchair and bed and is anticipating urostomy in the next several months for neurogenic bladder.    Memory changes She did not tolerate trial of Wellbutrin due to worsening flaccid paralysis of left side. She has not had an MRI in over a year. B12 and thyroid function were both normal last month. She is being referred to go for neurology for ongoing  care of multiple sclerosis and will likely need a repeat MRI of the brain.  I will go ahead and order that to be done at [facility] further easy access and rehabilitate.  Neurogenic bladder Secondary to progressive multiple sclerosis. Urostomy planned by [facility] urology.  HYPERLIPIDEMIA She has a history of intolerance to statin therapy. Therefore her hyperlipidemia is not treated.  Hypertension Well controlled on current regimen. Renal function stable, no changes today.  ANXIETY DEPRESSION Her Wellbutrin is being tapered rapidly to off this week and she will resume Lexapro as well.  A total of 40 minutes, more than half of which was face to face time, was spent in evaluation and treatment of patient, including reviewing records from other providers and recent laboratory data.    Updated Medication List Outpatient Encounter Prescriptions as of 09/21/2012  Medication Sig Dispense Refill  . BACLOFEN IT 65.07 mcg model No. [id number] implant date 09/22/11 At [facility]      . Cholecalciferol (VITAMIN D3) 2000 UNITS TABS Take 1 tablet by mouth daily.       Marland Kitchen  Cyanocobalamin (B-12) 500 MCG SUBL Place 1 tablet under the tongue daily.  150 tablet  11  . gabapentin (NEURONTIN) 300 MG capsule Take 2 capsules (600 mg total) by mouth 3 (three) times daily. 2 tablets every morning, 2 tablets at 3pm, and 2 tablets at night  180 capsule  11  . HYDROcodone-acetaminophen (NORCO/VICODIN) 5-325 MG per tablet Take 1 tablet by mouth every 6 (six) hours as needed for pain.  30 tablet  3  . lisinopril (PRINIVIL,ZESTRIL) 20 MG tablet Take 1 tablet (20 mg total) by mouth daily.  30 tablet  11  . LORazepam (ATIVAN) 0.5 MG tablet Take 1 tablet (0.5 mg total) by mouth daily as needed.  30 tablet  3  . ondansetron (ZOFRAN) 4 MG tablet Take 1 tablet (4 mg total) by mouth every 8 (eight) hours as needed for nausea.  20 tablet  0  . escitalopram (LEXAPRO) 20 MG tablet Take 1 tablet (20 mg total) by mouth daily.  30 tablet  11  .  [DISCONTINUED] buPROPion (WELLBUTRIN SR) 150 MG 12 hr tablet Take 1 tablet (150 mg total) by mouth 2 (two) times daily.  60 tablet  1   No facility-administered encounter medications on file as of 09/21/2012.     Orders Placed This Encounter  Procedures  . MR Brain W Wo Contrast  . Ambulatory referral to Neurology    No Follow-up on file.

## 2012-09-21 NOTE — Patient Instructions (Addendum)
Take your next and last wellbutrin on Saturday.  Start the [employer] tomorrow (Friday).  Start the lexapro  with 1/2 tablet daily in the evening for a few days until you are sure it is not nauseating you   I recommend [facility] for your new neurology group.

## 2012-09-23 ENCOUNTER — Encounter: Payer: Self-pay | Admitting: Internal Medicine

## 2012-09-23 NOTE — Assessment & Plan Note (Signed)
Her Wellbutrin is being tapered rapidly to off this week and she will resume Lexapro as well.

## 2012-09-23 NOTE — Assessment & Plan Note (Signed)
Well controlled on current regimen. Renal function stable, no changes today. 

## 2012-09-23 NOTE — Assessment & Plan Note (Addendum)
She did not tolerate trial of Wellbutrin due to worsening flaccid paralysis of left side. She has not had an MRI in over a year. B12 and thyroid function were both normal last month. She is being referred to go for neurology for ongoing care of multiple sclerosis and will likely need a repeat MRI of the brain.  I will go ahead and order that to be done at [facility] further easy access and rehabilitate.

## 2012-09-23 NOTE — Assessment & Plan Note (Signed)
Progressive, with complete loss of mobility and spasticity now managed with baclofen pump.  She requires maximal assistance with transfers in and out of wheelchair and bed and is anticipating urostomy in the next several months for neurogenic bladder.

## 2012-09-23 NOTE — Assessment & Plan Note (Signed)
She has a history of intolerance to statin therapy. Therefore her hyperlipidemia is not treated.

## 2012-09-23 NOTE — Assessment & Plan Note (Signed)
Secondary to progressive multiple sclerosis. Urostomy planned by [facility] urology.

## 2012-09-26 ENCOUNTER — Telehealth: Payer: Self-pay | Admitting: Internal Medicine

## 2012-09-26 NOTE — Telephone Encounter (Signed)
Appt has been cancelled w/ [facility] at request of Nadara Eaton, pt home nurse.

## 2012-09-26 NOTE — Telephone Encounter (Signed)
Just to notify you

## 2012-09-26 NOTE — Telephone Encounter (Signed)
Pt home nurse calling stating that the pt rcv'd a call that she has an MRI scheduled.  Angelica Wilcox states that only Neurology should have been scheduled and that the MRI needs to be cancelled and she does not know why that was scheduled.  Advised that Dr. Darrick Huntsman asked that the MRI be scheduled.  Angelica Wilcox states that they will not have the MRI until after seeing the new neurologist in case other/different testing is ordered.

## 2012-09-27 DIAGNOSIS — I10 Essential (primary) hypertension: Secondary | ICD-10-CM | POA: Diagnosis not present

## 2012-09-27 DIAGNOSIS — N319 Neuromuscular dysfunction of bladder, unspecified: Secondary | ICD-10-CM | POA: Diagnosis not present

## 2012-09-27 DIAGNOSIS — M810 Age-related osteoporosis without current pathological fracture: Secondary | ICD-10-CM | POA: Diagnosis not present

## 2012-09-27 DIAGNOSIS — F341 Dysthymic disorder: Secondary | ICD-10-CM | POA: Diagnosis not present

## 2012-09-27 DIAGNOSIS — E785 Hyperlipidemia, unspecified: Secondary | ICD-10-CM | POA: Diagnosis not present

## 2012-09-27 DIAGNOSIS — G35 Multiple sclerosis: Secondary | ICD-10-CM | POA: Diagnosis not present

## 2012-09-28 ENCOUNTER — Ambulatory Visit (HOSPITAL_COMMUNITY): Payer: Medicare Other

## 2012-10-03 DIAGNOSIS — N319 Neuromuscular dysfunction of bladder, unspecified: Secondary | ICD-10-CM | POA: Diagnosis not present

## 2012-10-03 DIAGNOSIS — E785 Hyperlipidemia, unspecified: Secondary | ICD-10-CM | POA: Diagnosis not present

## 2012-10-03 DIAGNOSIS — F341 Dysthymic disorder: Secondary | ICD-10-CM | POA: Diagnosis not present

## 2012-10-03 DIAGNOSIS — I10 Essential (primary) hypertension: Secondary | ICD-10-CM | POA: Diagnosis not present

## 2012-10-03 DIAGNOSIS — G35 Multiple sclerosis: Secondary | ICD-10-CM | POA: Diagnosis not present

## 2012-10-03 DIAGNOSIS — M810 Age-related osteoporosis without current pathological fracture: Secondary | ICD-10-CM | POA: Diagnosis not present

## 2012-10-04 DIAGNOSIS — R339 Retention of urine, unspecified: Secondary | ICD-10-CM | POA: Diagnosis not present

## 2012-10-04 DIAGNOSIS — K592 Neurogenic bowel, not elsewhere classified: Secondary | ICD-10-CM | POA: Diagnosis not present

## 2012-10-04 DIAGNOSIS — R269 Unspecified abnormalities of gait and mobility: Secondary | ICD-10-CM | POA: Diagnosis not present

## 2012-10-04 DIAGNOSIS — Z79899 Other long term (current) drug therapy: Secondary | ICD-10-CM | POA: Diagnosis not present

## 2012-10-04 DIAGNOSIS — G35 Multiple sclerosis: Secondary | ICD-10-CM | POA: Diagnosis not present

## 2012-10-04 DIAGNOSIS — N319 Neuromuscular dysfunction of bladder, unspecified: Secondary | ICD-10-CM | POA: Diagnosis not present

## 2012-10-06 DIAGNOSIS — M810 Age-related osteoporosis without current pathological fracture: Secondary | ICD-10-CM | POA: Diagnosis not present

## 2012-10-06 DIAGNOSIS — I10 Essential (primary) hypertension: Secondary | ICD-10-CM | POA: Diagnosis not present

## 2012-10-06 DIAGNOSIS — G35 Multiple sclerosis: Secondary | ICD-10-CM | POA: Diagnosis not present

## 2012-10-06 DIAGNOSIS — N319 Neuromuscular dysfunction of bladder, unspecified: Secondary | ICD-10-CM | POA: Diagnosis not present

## 2012-10-06 DIAGNOSIS — F341 Dysthymic disorder: Secondary | ICD-10-CM | POA: Diagnosis not present

## 2012-10-06 DIAGNOSIS — E785 Hyperlipidemia, unspecified: Secondary | ICD-10-CM | POA: Diagnosis not present

## 2012-10-09 DIAGNOSIS — M810 Age-related osteoporosis without current pathological fracture: Secondary | ICD-10-CM | POA: Diagnosis not present

## 2012-10-09 DIAGNOSIS — E785 Hyperlipidemia, unspecified: Secondary | ICD-10-CM | POA: Diagnosis not present

## 2012-10-09 DIAGNOSIS — N319 Neuromuscular dysfunction of bladder, unspecified: Secondary | ICD-10-CM | POA: Diagnosis not present

## 2012-10-09 DIAGNOSIS — F341 Dysthymic disorder: Secondary | ICD-10-CM | POA: Diagnosis not present

## 2012-10-09 DIAGNOSIS — G35 Multiple sclerosis: Secondary | ICD-10-CM | POA: Diagnosis not present

## 2012-10-09 DIAGNOSIS — I10 Essential (primary) hypertension: Secondary | ICD-10-CM | POA: Diagnosis not present

## 2012-10-10 DIAGNOSIS — N319 Neuromuscular dysfunction of bladder, unspecified: Secondary | ICD-10-CM | POA: Diagnosis not present

## 2012-10-10 DIAGNOSIS — R269 Unspecified abnormalities of gait and mobility: Secondary | ICD-10-CM | POA: Diagnosis not present

## 2012-10-10 DIAGNOSIS — K592 Neurogenic bowel, not elsewhere classified: Secondary | ICD-10-CM | POA: Diagnosis not present

## 2012-10-10 DIAGNOSIS — G35 Multiple sclerosis: Secondary | ICD-10-CM | POA: Diagnosis not present

## 2012-10-11 DIAGNOSIS — N319 Neuromuscular dysfunction of bladder, unspecified: Secondary | ICD-10-CM | POA: Diagnosis not present

## 2012-10-11 DIAGNOSIS — F341 Dysthymic disorder: Secondary | ICD-10-CM | POA: Diagnosis not present

## 2012-10-11 DIAGNOSIS — G35 Multiple sclerosis: Secondary | ICD-10-CM | POA: Diagnosis not present

## 2012-10-11 DIAGNOSIS — I10 Essential (primary) hypertension: Secondary | ICD-10-CM | POA: Diagnosis not present

## 2012-10-11 DIAGNOSIS — E785 Hyperlipidemia, unspecified: Secondary | ICD-10-CM | POA: Diagnosis not present

## 2012-10-11 DIAGNOSIS — M810 Age-related osteoporosis without current pathological fracture: Secondary | ICD-10-CM | POA: Diagnosis not present

## 2012-10-12 ENCOUNTER — Ambulatory Visit (INDEPENDENT_AMBULATORY_CARE_PROVIDER_SITE_OTHER): Payer: Medicare Other | Admitting: Neurology

## 2012-10-12 ENCOUNTER — Encounter: Payer: Self-pay | Admitting: Neurology

## 2012-10-12 VITALS — BP 117/72 | HR 78

## 2012-10-12 DIAGNOSIS — G35 Multiple sclerosis: Secondary | ICD-10-CM | POA: Diagnosis not present

## 2012-10-12 DIAGNOSIS — R413 Other amnesia: Secondary | ICD-10-CM | POA: Diagnosis not present

## 2012-10-12 NOTE — Progress Notes (Signed)
Reason for visit: Multiple sclerosis  Angelica Wilcox is a 67 y.o. female  History of present illness:  Angelica Wilcox is a 67 year old right-handed white female with a history of multiple sclerosis with a chronic progressive course. The patient indicates that she was first diagnosed in 1998 with a problem with walking and balance. The patient indicates that since diagnosis, her course has always been gradually progressive. Over time, the patient is developing a more prominent quadriparesis, and she has not been ambulatory in greater than 6 years. The patient initially was treated with Avonex, but she has not been on any disease modifying agents since 2001. The patient currently lives alone, but she has family members and other assistants coming in to help her. The patient has had a baclofen pump placed one year ago, and this has been helpful for the lower extremity spasticity. The patient has had a suprapubic catheter placed recently, and this has helped her lifestyle, as she is not having to transfer to the commode as frequently. The patient is working on a bowel regimen. The patient lost use of most of her right hand, and she uses her left arm mainly. The patient has no significant skin issues. The patient reports a lot of problems with pain that affects the legs and the right hand and arm. The patient uses medical marijuana for this. The patient is also on gabapentin. The patient comes to this office for an evaluation. The patient wishes to have the baclofen pump managed through [facility]. The patient does report some issues with memory. MRI evaluation of the cervical spine and brain were done in November 2012. The spinal cord has lesions at the C2 level and at the C6-7 level. Multiple brain lesions were seen by MRI of the brain.  Past Medical History  Diagnosis Date  . Dysthymic disorder   . Calculus of gallbladder without mention of cholecystitis or obstruction   . Other constipation   .  Contracture of hand joint   . Diverticulitis of colon (without mention of hemorrhage)   . Other malaise and fatigue   . Esophageal reflux   . Headache   . Other and unspecified hyperlipidemia   . Unspecified urinary incontinence   . Unspecified essential hypertension   . Multiple sclerosis   . Nonspecific elevation of levels of transaminase or lactic acid dehydrogenase (LDH)   . Osteoporosis, unspecified   . Other abnormality of urination   . Rectocele   . Other alteration of consciousness   . Unspecified vitamin D deficiency   . Other B-complex deficiencies   . Dyslipidemia     Past Surgical History  Procedure Laterality Date  . Tubal ligation    . Tonsillectomy and adenoidectomy    . Colon screen  1997  . Carotid doppler  09/2002    Negative  . Dexa  12/2008    OP, slt worse  . Baclofen pump refill  4-24/13    model No. [id number] @ [facility]    Family History  Problem Relation Age of Onset  . Cancer Father   . Hypertension Father   . Alcohol abuse Father   . Heart failure Father     CHF  . Glaucoma Mother   . Pulmonary fibrosis Mother   . Celiac disease      sibling  . Stroke Daughter     Social history:  reports that she quit smoking about 36 years ago. She has never used smokeless tobacco. She reports that  drinks alcohol. She reports that she uses illicit drugs (Marijuana).  Medications:  Current Outpatient Prescriptions on File Prior to Visit  Medication Sig Dispense Refill  . BACLOFEN IT 65.07 mcg model No. [id number] implant date 09/22/11 At [facility]      . Cholecalciferol (VITAMIN D3) 2000 UNITS TABS Take 1 tablet by mouth daily.       . Cyanocobalamin (B-12) 500 MCG SUBL Place 1 tablet under the tongue daily.  150 tablet  11  . escitalopram (LEXAPRO) 20 MG tablet Take 1 tablet (20 mg total) by mouth daily.  30 tablet  11  . gabapentin (NEURONTIN) 300 MG capsule Take 2 capsules (600 mg total) by mouth 3 (three) times daily. 2 tablets every morning, 2 tablets at 3pm, and 2  tablets at night  180 capsule  11  . HYDROcodone-acetaminophen (NORCO/VICODIN) 5-325 MG per tablet Take 1 tablet by mouth every 6 (six) hours as needed for pain.  30 tablet  3  . lisinopril (PRINIVIL,ZESTRIL) 20 MG tablet Take 1 tablet (20 mg total) by mouth daily.  30 tablet  11  . LORazepam (ATIVAN) 0.5 MG tablet Take 1 tablet (0.5 mg total) by mouth daily as needed.  30 tablet  3  . ondansetron (ZOFRAN) 4 MG tablet Take 1 tablet (4 mg total) by mouth every 8 (eight) hours as needed for nausea.  20 tablet  0   No current facility-administered medications on file prior to visit.    Allergies:  Allergies  Allergen Reactions  . Alendronate Sodium     REACTION: GI  . Atorvastatin     REACTION: myalgia  . Bactrim Nausea And Vomiting  . Ciprofloxacin     REACTION: vomiting    ROS:  Out of a complete 14 system review of symptoms, the patient complains only of the following symptoms, and all other reviewed systems are negative.  Swelling in the legs Rash, moles Incontinence of bowel and bladder, constipation Memory loss, confusion, numbness, weakness Anxiety, decreased energy  Blood pressure 117/72, pulse 78.  Physical Exam  General: The patient is alert and cooperative at the time of the examination.  Head: Pupils are equal, round, and reactive to light. Discs are flat bilaterally.  Neck: The neck is supple, no carotid bruits are noted.  Respiratory: The respiratory examination is clear.  Cardiovascular: The cardiovascular examination reveals a regular rate and rhythm, no obvious murmurs or rubs are noted.  Skin: Extremities are with 2+ edema below the knees.  Neurologic Exam  Mental status:  Cranial nerves: Facial symmetry is present. There is good sensation of the face to pinprick and soft touch bilaterally. The strength of the facial muscles and the muscles to head turning and shoulder shrug are normal bilaterally. Speech is well enunciated, no aphasia or dysarthria  is noted. Extraocular movements are full. Visual fields are full.  Motor: The motor testing reveals 5 over 5 strength of the left arm. The patient has good grip strength with the right hand, but she is unable to extend the fingers or the wrist on the right. The patient has 4+/5 strength in the upper arm on the right. The patient has essentially no use of the right leg, the patient has minimal extension of the knee with the left leg, and she can dorsiflex the left foot slightly. There is no adductor or abductor strength on either leg.  Sensory: Sensory testing is intact to pinprick, soft touch, vibration sensation, and position sense on all 4 extremities , with  the exception that there is decreased position sense with the left foot. . No evidence of extinction is noted.  Coordination: Cerebellar testing reveals good finger-nose-finger bilaterally.   Gait and station: Gait could not be tested, the patient is wheelchair-bound.   Reflexes: Deep tendon reflexes are symmetric, but are depressed bilaterally. Toes are neutral bilaterally.   Assessment/Plan:  One. Multiple sclerosis, chronic progressive  2. Gait disturbance  3. Memory Disturbance  4. Chronic pain syndrome  The patient is not on disease modifying agents at this point. The patient does have a baclofen pump in place, but she will be having this followed through [facility]. The patient gets her medications through her primary care physician. At this point, I have very little to offer this patient, but she wishes to continue contact with a local neurologist. The patient has good grip strength with the right hand, but she cannot extend her fingers. The patient will be referred for occupational therapy evaluation for a possible splint to improve function of the right hand. A home health nursing consult will be obtained for care of a suprapubic catheter. The patient will followup in 6 months.  Marlan Palau MD 10/12/2012 9:11  PM  [facility] [address] [place], [place] [address]  Phone [phone] Fax [phone]

## 2012-10-13 DIAGNOSIS — N319 Neuromuscular dysfunction of bladder, unspecified: Secondary | ICD-10-CM | POA: Diagnosis not present

## 2012-10-13 DIAGNOSIS — E785 Hyperlipidemia, unspecified: Secondary | ICD-10-CM | POA: Diagnosis not present

## 2012-10-13 DIAGNOSIS — G35 Multiple sclerosis: Secondary | ICD-10-CM | POA: Diagnosis not present

## 2012-10-13 DIAGNOSIS — F341 Dysthymic disorder: Secondary | ICD-10-CM | POA: Diagnosis not present

## 2012-10-13 DIAGNOSIS — I10 Essential (primary) hypertension: Secondary | ICD-10-CM | POA: Diagnosis not present

## 2012-10-13 DIAGNOSIS — M810 Age-related osteoporosis without current pathological fracture: Secondary | ICD-10-CM | POA: Diagnosis not present

## 2012-10-16 DIAGNOSIS — E785 Hyperlipidemia, unspecified: Secondary | ICD-10-CM | POA: Diagnosis not present

## 2012-10-16 DIAGNOSIS — I10 Essential (primary) hypertension: Secondary | ICD-10-CM | POA: Diagnosis not present

## 2012-10-16 DIAGNOSIS — N319 Neuromuscular dysfunction of bladder, unspecified: Secondary | ICD-10-CM | POA: Diagnosis not present

## 2012-10-16 DIAGNOSIS — F341 Dysthymic disorder: Secondary | ICD-10-CM | POA: Diagnosis not present

## 2012-10-16 DIAGNOSIS — G35 Multiple sclerosis: Secondary | ICD-10-CM | POA: Diagnosis not present

## 2012-10-16 DIAGNOSIS — M810 Age-related osteoporosis without current pathological fracture: Secondary | ICD-10-CM | POA: Diagnosis not present

## 2012-10-24 DIAGNOSIS — M810 Age-related osteoporosis without current pathological fracture: Secondary | ICD-10-CM | POA: Diagnosis not present

## 2012-10-24 DIAGNOSIS — F341 Dysthymic disorder: Secondary | ICD-10-CM | POA: Diagnosis not present

## 2012-10-24 DIAGNOSIS — G35 Multiple sclerosis: Secondary | ICD-10-CM | POA: Diagnosis not present

## 2012-10-24 DIAGNOSIS — I10 Essential (primary) hypertension: Secondary | ICD-10-CM | POA: Diagnosis not present

## 2012-10-24 DIAGNOSIS — N319 Neuromuscular dysfunction of bladder, unspecified: Secondary | ICD-10-CM | POA: Diagnosis not present

## 2012-10-24 DIAGNOSIS — E785 Hyperlipidemia, unspecified: Secondary | ICD-10-CM | POA: Diagnosis not present

## 2012-10-25 ENCOUNTER — Telehealth: Payer: Self-pay | Admitting: *Deleted

## 2012-10-25 DIAGNOSIS — I10 Essential (primary) hypertension: Secondary | ICD-10-CM | POA: Diagnosis not present

## 2012-10-25 DIAGNOSIS — G35 Multiple sclerosis: Secondary | ICD-10-CM | POA: Diagnosis not present

## 2012-10-25 DIAGNOSIS — F341 Dysthymic disorder: Secondary | ICD-10-CM | POA: Diagnosis not present

## 2012-10-25 DIAGNOSIS — N319 Neuromuscular dysfunction of bladder, unspecified: Secondary | ICD-10-CM | POA: Diagnosis not present

## 2012-10-25 DIAGNOSIS — E785 Hyperlipidemia, unspecified: Secondary | ICD-10-CM | POA: Diagnosis not present

## 2012-10-25 DIAGNOSIS — M810 Age-related osteoporosis without current pathological fracture: Secondary | ICD-10-CM | POA: Diagnosis not present

## 2012-10-25 NOTE — Telephone Encounter (Signed)
Angelica Wilcox needs order to resume homehealth and to flush supra pubic catheter. Verbal order given. [facility] notified.

## 2012-10-30 DIAGNOSIS — I10 Essential (primary) hypertension: Secondary | ICD-10-CM | POA: Diagnosis not present

## 2012-10-30 DIAGNOSIS — G35 Multiple sclerosis: Secondary | ICD-10-CM | POA: Diagnosis not present

## 2012-10-30 DIAGNOSIS — M810 Age-related osteoporosis without current pathological fracture: Secondary | ICD-10-CM | POA: Diagnosis not present

## 2012-10-30 DIAGNOSIS — N319 Neuromuscular dysfunction of bladder, unspecified: Secondary | ICD-10-CM | POA: Diagnosis not present

## 2012-10-30 DIAGNOSIS — F341 Dysthymic disorder: Secondary | ICD-10-CM | POA: Diagnosis not present

## 2012-10-30 DIAGNOSIS — E785 Hyperlipidemia, unspecified: Secondary | ICD-10-CM | POA: Diagnosis not present

## 2012-10-31 ENCOUNTER — Ambulatory Visit (INDEPENDENT_AMBULATORY_CARE_PROVIDER_SITE_OTHER): Payer: Medicare Other | Admitting: Internal Medicine

## 2012-10-31 ENCOUNTER — Encounter: Payer: Self-pay | Admitting: Internal Medicine

## 2012-10-31 VITALS — BP 118/68 | HR 74 | Temp 98.0°F | Resp 14

## 2012-10-31 DIAGNOSIS — R413 Other amnesia: Secondary | ICD-10-CM

## 2012-10-31 DIAGNOSIS — N319 Neuromuscular dysfunction of bladder, unspecified: Secondary | ICD-10-CM

## 2012-10-31 DIAGNOSIS — M810 Age-related osteoporosis without current pathological fracture: Secondary | ICD-10-CM | POA: Diagnosis not present

## 2012-10-31 DIAGNOSIS — I10 Essential (primary) hypertension: Secondary | ICD-10-CM | POA: Diagnosis not present

## 2012-10-31 DIAGNOSIS — G35 Multiple sclerosis: Secondary | ICD-10-CM | POA: Diagnosis not present

## 2012-10-31 DIAGNOSIS — E785 Hyperlipidemia, unspecified: Secondary | ICD-10-CM | POA: Diagnosis not present

## 2012-10-31 DIAGNOSIS — G35D Multiple sclerosis, unspecified: Secondary | ICD-10-CM | POA: Diagnosis not present

## 2012-10-31 DIAGNOSIS — F341 Dysthymic disorder: Secondary | ICD-10-CM | POA: Diagnosis not present

## 2012-10-31 NOTE — Assessment & Plan Note (Signed)
Now having memory problems again since the wellbutrin was stopped.  Losing train of though.  Discussed medicaitons,  I afvised against it.  Will return for a MMSE  In one onth

## 2012-10-31 NOTE — Patient Instructions (Addendum)
Dulcolax is a stimulant laxative. 5 and 10 mg doses OTC . It usually causes overnight relief   Miralax is a bulk forming laxative   Lactulose is a prescription only cathartic (as is magnesium citrate)    Return in one month for a mental status exam   Referral to [facility] to help coordinate your home care

## 2012-10-31 NOTE — Progress Notes (Signed)
Patient ID: Angelica Wilcox, female   DOB: June 21, 1945, 67 y.o.   MRN: [id number]   Patient Active Problem List   Diagnosis Date Noted  . Pressure sore 09/15/2012  . Memory changes 08/26/2012  . Protein-calorie malnutrition, mild 11/14/2011  . Hypertension 10/02/2010  . 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 Unspecified vitamin D deficiency 07/05/2008  . VITAMIN B12 DEFICIENCY 06/04/2008  . ANXIETY DEPRESSION 05/29/2008  . FATIGUE 05/29/2008  . NONSPEC ELEVATION OF LEVELS OF TRANSAMINASE/LDH 09/06/2007  . DIVERTICULITIS, COLON 08/03/2007  . CONSTIPATION, CHRONIC 08/03/2007  . CHOLELITHIASIS, ASYMPTOMATIC 08/03/2007  . HYPERLIPIDEMIA 11/04/2006  . Multiple sclerosis, primary progressive 11/04/2006  . GERD 11/04/2006  . PROLAPSE, VAGINAL WALL, RECTOCELE 11/04/2006  . OSTEOPOROSIS 11/04/2006  . Neurogenic bladder 11/04/2006    Subjective:  CC:   Chief Complaint  Patient presents with  . Follow-up    SUPRA PUBIC CATHETER ON 1\47\82 AT [facility].    HPI:   Angelica Mallet Sanfordis a 67 y.o. female who presents for 1 month follow up on chronic problems  Sheis a 67 yr old feamle with MS ,  Wheelchair bound, who  had a Suprapubic catheter placed at [facility] by Dr Raoul Pitch, Chris(ph  [phone])  on May 13 th. Home Health RN has come out to fix an obstruction , still has them coming out for 2 more visits.  Several women coming out at night to help her to bed ,  They are from the [employer],  They are hired privately by patient. Milly, her RN  Is leaving for [place] in a week and she has no replacement .    Likes the [facility] home [facility] care  But was told she will needs an alternative agency in 2 weeks.  OT is finishing up as well with bowel training , also  From [facility].    She is not considered homebound because she can leave the house for more than just doctor visits and hair appts.  She has used up her insurance coverage for [facility] and will have to pay out of pocket in the future. Milly feels  strongly that she needs an Char[employer] fundraiser but patient feels it is too expensive and prefers to pay hourly for prn help for daytime activities.     Needs a [facility] referral to help coordinate care.  For bowel training,  She will need  training to continue  Through OT.  Stool is too soft to control , so fiber has been reduced .    Weakness of arms has improved since stopping wellbutrin but not completely resolved. She continues to need assistance transferring and has noted increased forgetfulness for details, short term memory and words secondary to poor concentration.    Past Medical History  Diagnosis Date  . Dysthymic disorder   . Calculus of gallbladder without mention of cholecystitis or obstruction   . Other constipation   . Contracture of hand joint   . Diverticulitis of colon (without mention of hemorrhage)   . Other malaise and fatigue   . Esophageal reflux   . Headache(784.0)   . Other and unspecified hyperlipidemia   . Unspecified urinary incontinence   . Unspecified essential hypertension   . Multiple sclerosis   . Nonspecific elevation of levels of transaminase or lactic acid dehydrogenase (LDH)   . Osteoporosis, unspecified   . Other abnormality of urination(788.69)   . Rectocele   . Other alteration of consciousness   . Unspecified vitamin D deficiency   .  Other B-complex deficiencies   . Dyslipidemia     Past Surgical History  Procedure Laterality Date  . Tubal ligation    . Tonsillectomy and adenoidectomy    . Colon screen  1997  . Carotid doppler  09/2002    Negative  . Dexa  12/2008    OP, slt worse  . Baclofen pump refill  4-24/13    model No. [id number] @ [facility]    The following portions of the patient's history were reviewed and updated as appropriate: Allergies, current medications, and problem list.    Review of Systems:   Patient denies headache, fevers, malaise, unintentional weight loss, skin rash, eye pain, sinus congestion and sinus pain, sore throat, dysphagia,   hemoptysis , cough, dyspnea, wheezing, chest pain, palpitations, orthopnea, edema, abdominal pain, nausea, melena, diarrhea, constipation, flank pain, dysuria, hematuria, urinary  Frequency, nocturia, numbness, tingling, seizures,  Focal weakness, Loss of consciousness,  Tremor, insomnia, depression, anxiety, and suicidal ideation.     History   Social History  . Marital Status: Widowed    Spouse Name: N/A    Number of Children: N/A  . Years of Education: N/A   Occupational History  . Disabled    Social History Main Topics  . Smoking status: Former Smoker    Quit date: 05/31/1976  . Smokeless tobacco: Never Used  . Alcohol Use: Yes     Comment: rarely drinks alcohol, The patient uses daily marijuana.  . Drug Use: Yes    Special: Marijuana  . Sexually Active: Not on file   Other Topics Concern  . Not on file   Social History Narrative   Disabled from MS-wheelchair bound    Objective:  BP 118/68  Pulse 74  Temp(Src) 98 F (36.7 C) (Oral)  Resp 14  SpO2 98%  General appearance: alert, cooperative and appears stated age Ears: normal TM's and external ear canals both ears Throat: lips, mucosa, and tongue normal; teeth and gums normal Neck: no adenopathy, no carotid bruit, supple, symmetrical, trachea midline and thyroid not enlarged, symmetric, no tenderness/mass/nodules Back: symmetric, no curvature. ROM normal. No CVA tenderness. Lungs: clear to auscultation bilaterally Heart: regular rate and rhythm, S1, S2 normal, no murmur, click, rub or gallop Abdomen: soft, non-tender; bowel sounds normal; no masses,  no organomegaly Pulses: 2+ and symmetric Skin: Skin color, texture, turgor normal. No rashes or lesions Lymph nodes: Cervical, supraclavicular, and axillary nodes normal.  Assessment and Plan:  Memory changes Now having memory problems again since the wellbutrin was stopped.  Losing train of though.  Discussed medicaitons,  I afvised against it.  Will return for  a MMSE  In one onth   Neurogenic bladder S/p suprapubic bladder may 2014 with several episodes of kinking which resolved with different placement of bag .   Multiple sclerosis, primary progressive She is not homebound but is wheelchair bound and requuires assistance in the morning and at beditme.  She is losing her RN and cannot afford to replace her.  She does not qualify for [facility] referral to help coordinate her care.  [facility]   A total of 40 minutes was spent with patient more than half of which was spent in counseling, reviewing records from other prviders and coordination of care. Updated Medication List Outpatient Encounter Prescriptions as of 10/31/2012  Medication Sig Dispense Refill  . BACLOFEN IT 65.07 mcg model No. [id number] implant date 09/22/11 At [facility]      . buPROPion [facility]  SR) 150 MG 12 hr tablet Take 150 mg by mouth daily.      . Cholecalciferol (VITAMIN D3) 2000 UNITS TABS Take 1 tablet by mouth daily.       . Cyanocobalamin (B-12) 500 MCG SUBL Place 1 tablet under the tongue daily.  150 tablet  11  . escitalopram (LEXAPRO) 20 MG tablet Take 1 tablet (20 mg total) by mouth daily.  30 tablet  11  . gabapentin (NEURONTIN) 300 MG capsule Take 2 capsules (600 mg total) by mouth 3 (three) times daily. 2 tablets every morning, 2 tablets at 3pm, and 2 tablets at night  180 capsule  11  . HYDROcodone-acetaminophen (NORCO/VICODIN) 5-325 MG per tablet Take 1 tablet by mouth every 6 (six) hours as needed for pain.  30 tablet  3  . lisinopril (PRINIVIL,ZESTRIL) 20 MG tablet Take 1 tablet (20 mg total) by mouth daily.  30 tablet  11  . LORazepam (ATIVAN) 0.5 MG tablet Take 1 tablet (0.5 mg total) by mouth daily as needed.  30 tablet  3  . ondansetron (ZOFRAN) 4 MG tablet Take 1 tablet (4 mg total) by mouth every 8 (eight) hours as needed for nausea.  20 tablet  0   No facility-administered encounter medications on file as of 10/31/2012.     No orders of the defined  types were placed in this encounter.    Return in about 4 weeks (around 11/28/2012).

## 2012-11-01 NOTE — Assessment & Plan Note (Addendum)
She is not homebound but is wheelchair bound and requuires assistance in the morning and at beditme.  She is losing her RN and cannot afford to replace her.  She does not qualify for [facility] referral to help coordinate her care.  [facility]

## 2012-11-01 NOTE — Assessment & Plan Note (Signed)
S/p suprapubic bladder may 2014 with several episodes of kinking which resolved with different placement of bag .

## 2012-11-06 DIAGNOSIS — I10 Essential (primary) hypertension: Secondary | ICD-10-CM | POA: Diagnosis not present

## 2012-11-06 DIAGNOSIS — N319 Neuromuscular dysfunction of bladder, unspecified: Secondary | ICD-10-CM | POA: Diagnosis not present

## 2012-11-06 DIAGNOSIS — G35 Multiple sclerosis: Secondary | ICD-10-CM | POA: Diagnosis not present

## 2012-11-06 DIAGNOSIS — M810 Age-related osteoporosis without current pathological fracture: Secondary | ICD-10-CM | POA: Diagnosis not present

## 2012-11-06 DIAGNOSIS — E785 Hyperlipidemia, unspecified: Secondary | ICD-10-CM | POA: Diagnosis not present

## 2012-11-06 DIAGNOSIS — F341 Dysthymic disorder: Secondary | ICD-10-CM | POA: Diagnosis not present

## 2012-11-07 ENCOUNTER — Other Ambulatory Visit (INDEPENDENT_AMBULATORY_CARE_PROVIDER_SITE_OTHER): Payer: Medicare Other

## 2012-11-07 DIAGNOSIS — N390 Urinary tract infection, site not specified: Secondary | ICD-10-CM | POA: Diagnosis not present

## 2012-11-07 LAB — POCT URINALYSIS DIPSTICK
Glucose, UA: NEGATIVE
Nitrite, UA: POSITIVE
Protein, UA: 100
Urobilinogen, UA: 0.2

## 2012-11-08 ENCOUNTER — Encounter: Payer: Self-pay | Admitting: Internal Medicine

## 2012-11-09 ENCOUNTER — Encounter: Payer: Self-pay | Admitting: Internal Medicine

## 2012-11-09 DIAGNOSIS — G35 Multiple sclerosis: Secondary | ICD-10-CM | POA: Diagnosis not present

## 2012-11-09 DIAGNOSIS — N319 Neuromuscular dysfunction of bladder, unspecified: Secondary | ICD-10-CM | POA: Diagnosis not present

## 2012-11-09 DIAGNOSIS — I10 Essential (primary) hypertension: Secondary | ICD-10-CM | POA: Diagnosis not present

## 2012-11-09 DIAGNOSIS — E785 Hyperlipidemia, unspecified: Secondary | ICD-10-CM | POA: Diagnosis not present

## 2012-11-09 DIAGNOSIS — F341 Dysthymic disorder: Secondary | ICD-10-CM | POA: Diagnosis not present

## 2012-11-09 DIAGNOSIS — M810 Age-related osteoporosis without current pathological fracture: Secondary | ICD-10-CM | POA: Diagnosis not present

## 2012-11-10 ENCOUNTER — Other Ambulatory Visit: Payer: Self-pay | Admitting: Internal Medicine

## 2012-11-10 LAB — URINE CULTURE

## 2012-11-10 MED ORDER — NITROFURANTOIN MONOHYD MACRO 100 MG PO CAPS: 100.0000 mg | ORAL_CAPSULE | Freq: Two times a day (BID) | ORAL | Status: DC

## 2012-11-15 DIAGNOSIS — N319 Neuromuscular dysfunction of bladder, unspecified: Secondary | ICD-10-CM | POA: Diagnosis not present

## 2012-11-18 DIAGNOSIS — G35 Multiple sclerosis: Secondary | ICD-10-CM | POA: Diagnosis not present

## 2012-11-18 DIAGNOSIS — N319 Neuromuscular dysfunction of bladder, unspecified: Secondary | ICD-10-CM | POA: Diagnosis not present

## 2012-11-18 DIAGNOSIS — E785 Hyperlipidemia, unspecified: Secondary | ICD-10-CM | POA: Diagnosis not present

## 2012-11-18 DIAGNOSIS — M810 Age-related osteoporosis without current pathological fracture: Secondary | ICD-10-CM | POA: Diagnosis not present

## 2012-11-18 DIAGNOSIS — F341 Dysthymic disorder: Secondary | ICD-10-CM | POA: Diagnosis not present

## 2012-11-18 DIAGNOSIS — I10 Essential (primary) hypertension: Secondary | ICD-10-CM | POA: Diagnosis not present

## 2012-11-19 ENCOUNTER — Other Ambulatory Visit: Payer: Self-pay | Admitting: Internal Medicine

## 2012-11-29 ENCOUNTER — Ambulatory Visit: Payer: Medicare Other | Admitting: Internal Medicine

## 2012-12-05 ENCOUNTER — Ambulatory Visit (INDEPENDENT_AMBULATORY_CARE_PROVIDER_SITE_OTHER): Payer: Medicare Other | Admitting: Internal Medicine

## 2012-12-05 ENCOUNTER — Encounter: Payer: Self-pay | Admitting: Internal Medicine

## 2012-12-05 VITALS — BP 118/68 | HR 79 | Temp 97.7°F | Resp 16

## 2012-12-05 DIAGNOSIS — R413 Other amnesia: Secondary | ICD-10-CM

## 2012-12-05 DIAGNOSIS — F341 Dysthymic disorder: Secondary | ICD-10-CM | POA: Diagnosis not present

## 2012-12-05 DIAGNOSIS — I10 Essential (primary) hypertension: Secondary | ICD-10-CM

## 2012-12-05 DIAGNOSIS — E785 Hyperlipidemia, unspecified: Secondary | ICD-10-CM

## 2012-12-05 NOTE — Progress Notes (Signed)
Patient ID: Angelica Wilcox, female   DOB: 13-Sep-1945, 67 y.o.   MRN: [id number]  Patient Active Problem List   Diagnosis Date Noted  . Pressure sore 09/15/2012  . Cognitive complaints with normal neuropsychological exam 08/26/2012  . Protein-calorie malnutrition, mild 11/14/2011  . Hypertension 10/02/2010  . 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 Unspecified vitamin D deficiency 07/05/2008  . VITAMIN B12 DEFICIENCY 06/04/2008  . ANXIETY DEPRESSION 05/29/2008  . NONSPEC ELEVATION OF LEVELS OF TRANSAMINASE/LDH 09/06/2007  . DIVERTICULITIS, COLON 08/03/2007  . CHOLELITHIASIS, ASYMPTOMATIC 08/03/2007  . HYPERLIPIDEMIA 11/04/2006  . Multiple sclerosis, primary progressive 11/04/2006  . GERD 11/04/2006  . PROLAPSE, VAGINAL WALL, RECTOCELE 11/04/2006  . OSTEOPOROSIS 11/04/2006  . Neurogenic bladder 11/04/2006    Subjective:  CC:   Chief Complaint  Patient presents with  . Follow-up    4 week    HPI:   Angelica Hausler Sanfordis a 67 y.o. female who presents forFollow up on cognitive deficits concerned raised by patietn at last visit,.  Forgetfulness main symptom.  Had a SLUMS Examiniation for mental acuity test done by a clinician and scored 27/30 (missed 2 of 5 objects at recall and was only able to name 14 animals in one minute) .  She is here today to have repeat testing by me.  She admits to  Having test taking anxiety when put under pressure.   Bladder ok.  Bowel training disappointing.  bowels move when staff is gone, and she is evrentually going to need help with transfer Follow up on cognitive deficits concerned raised by patietn at last visit,.  Forgetfulness main symptom.  Had a SLUMS Examiniation for mental acuity test done by a clinician and scored 27/30 (missed 2 of 5 objects at recall and was only able to name 14 animals in one minute) .  She is here today to have repeat testing by me.  She admits to  Having test taking anxiety when put under pressure.   Bladder ok.  Bowel  training disappointing.  bowels move when staff is gone, and she is evrentually going to need help with transfer Follow up on cognitive deficits concerned raised by patietn at last visit,.  Forgetfulness main symptom.  Had a SLUMS Examiniation for mental acuity test done by a clinician and scored 27/30 (missed 2 of 5 objects at recall and was only able to name 14 animals in one minute) .  She is here today to have repeat testing by me.  She admits to  Having test taking anxiety when put under pressure.   Bladder ok.  Bowel training disappointing.  bowels move when staff is gone, and she is evrentually going to need help with transfer     Past Medical History  Diagnosis Date  . Dysthymic disorder   . Calculus of gallbladder without mention of cholecystitis or obstruction   . Other constipation   . Contracture of hand joint   . Diverticulitis of colon (without mention of hemorrhage)   . Other malaise and fatigue   . Esophageal reflux   . Headache(784.0)   . Other and unspecified hyperlipidemia   . Unspecified urinary incontinence   . Unspecified essential hypertension   . Multiple sclerosis   . Nonspecific elevation of levels of transaminase or lactic acid dehydrogenase (LDH)   . Osteoporosis, unspecified   . Other abnormality of urination(788.69)   . Rectocele   . Other alteration of consciousness   . Unspecified vitamin D deficiency   . Other B-complex  deficiencies   . Dyslipidemia     Past Surgical History  Procedure Laterality Date  . Tubal ligation    . Tonsillectomy and adenoidectomy    . Colon screen  1997  . Carotid doppler  09/2002    Negative  . Dexa  12/2008    OP, slt worse  . Baclofen pump refill  4-24/13    model No. [id number] @ [facility]       The following portions of the patient's history were reviewed and updated as appropriate: Allergies, current medications, and problem list.    Review of Systems:   12 Pt  review of systems was negative except those  addressed in the HPI,     History   Social History  . Marital Status: Widowed    Spouse Name: N/A    Number of Children: N/A  . Years of Education: N/A   Occupational History  . Disabled    Social History Main Topics  . Smoking status: Former Smoker    Quit date: 05/31/1976  . Smokeless tobacco: Never Used  . Alcohol Use: Yes     Comment: rarely drinks alcohol, The patient uses daily marijuana.  . Drug Use: Yes    Special: Marijuana  . Sexually Active: Not on file   Other Topics Concern  . Not on file   Social History Narrative   Disabled from MS-wheelchair bound    Objective:  BP 118/68  Pulse 79  Temp(Src) 97.7 F (36.5 C) (Oral)  Resp 16  SpO2 98%  General appearance: alert, cooperative and appears stated age Ears: normal TM's and external ear canals both ears Throat: lips, mucosa, and tongue normal; teeth and gums normal Neck: no adenopathy, no carotid bruit, supple, symmetrical, trachea midline and thyroid not enlarged, symmetric, no tenderness/mass/nodules Back: symmetric, no curvature. ROM normal. No CVA tenderness. Lungs: clear to auscultation bilaterally Heart: regular rate and rhythm, S1, S2 normal, no murmur, click, rub or gallop Abdomen: soft, non-tender; bowel sounds normal; no masses,  no organomegaly Pulses: 2+ and symmetric Skin: Skin color, texture, turgor normal. No rashes or lesions Lymph nodes: Cervical, supraclavicular, and axillary nodes normal.  Assessment and Plan:  Cognitive complaints with normal neuropsychological exam Patient scored scored 28/30 on the MMSE and was able to name 14 words starting with  F in 60 seconds.  Calculation, clock drawing, were all fine  Reassurance provided that this is a normal score.    ANXIETY DEPRESSION Better controlled with lexapro and lorazepam.  wellbutrin was not tolerated due to worsening cognitive changes.   HYPERLIPIDEMIA She has a history of intolerance to statin therapy. Therefore her  hyperlipidemia is not treated.    Hypertension Well controlled on current regimen. Renal function stable, no changes today.   Updated Medication List Outpatient Encounter Prescriptions as of 12/05/2012  Medication Sig Dispense Refill  . BACLOFEN IT 65.07 mcg model No. [id number] implant date 09/22/11 At [facility]      . Cholecalciferol (VITAMIN D3) 2000 UNITS TABS Take 1 tablet by mouth daily.       . Cyanocobalamin (B-12) 500 MCG SUBL Place 1 tablet under the tongue daily.  150 tablet  11  . escitalopram (LEXAPRO) 20 MG tablet Take 1 tablet (20 mg total) by mouth daily.  30 tablet  11  . gabapentin (NEURONTIN) 300 MG capsule Take 2 capsules (600 mg total) by mouth 3 (three) times daily. 2 tablets every morning, 2 tablets at 3pm, and 2 tablets at night  180 capsule  11  . HYDROcodone-acetaminophen (NORCO/VICODIN) 5-325 MG per tablet Take 1 tablet by mouth every 6 (six) hours as needed for pain.  30 tablet  3  . lisinopril (PRINIVIL,ZESTRIL) 20 MG tablet TAKE ONE TABLET BY MOUTH ONE TIME DAILY  30 tablet  5  . LORazepam (ATIVAN) 0.5 MG tablet Take 1 tablet (0.5 mg total) by mouth daily as needed.  30 tablet  3  . [DISCONTINUED] buPROPion (WELLBUTRIN SR) 150 MG 12 hr tablet Take 150 mg by mouth daily.      . nitrofurantoin, macrocrystal-monohydrate, (MACROBID) 100 MG capsule Take 1 capsule (100 mg total) by mouth 2 (two) times daily.  10 capsule  0  . ondansetron (ZOFRAN) 4 MG tablet Take 1 tablet (4 mg total) by mouth every 8 (eight) hours as needed for nausea.  20 tablet  0   No facility-administered encounter medications on file as of 12/05/2012.     No orders of the defined types were placed in this encounter.    Return in about 6 months (around 06/07/2013).

## 2012-12-07 ENCOUNTER — Encounter: Payer: Self-pay | Admitting: Internal Medicine

## 2012-12-07 NOTE — Assessment & Plan Note (Signed)
Well controlled on current regimen. Renal function stable, no changes today. 

## 2012-12-07 NOTE — Assessment & Plan Note (Signed)
She has a history of intolerance to statin therapy. Therefore her hyperlipidemia is not treated.

## 2012-12-07 NOTE — Assessment & Plan Note (Addendum)
Better controlled with lexapro and lorazepam.  wellbutrin was not tolerated due to worsening cognitive changes.

## 2012-12-07 NOTE — Assessment & Plan Note (Signed)
Patient scored scored 28/30 on the MMSE and was able to name 14 words starting with  F in 60 seconds.  Calculation, clock drawing, were all fine  Reassurance provided that this is a normal score.

## 2012-12-28 DIAGNOSIS — N319 Neuromuscular dysfunction of bladder, unspecified: Secondary | ICD-10-CM | POA: Diagnosis not present

## 2013-01-03 ENCOUNTER — Other Ambulatory Visit: Payer: Self-pay

## 2013-01-22 DIAGNOSIS — R259 Unspecified abnormal involuntary movements: Secondary | ICD-10-CM | POA: Diagnosis not present

## 2013-01-22 DIAGNOSIS — M62838 Other muscle spasm: Secondary | ICD-10-CM | POA: Diagnosis not present

## 2013-01-22 DIAGNOSIS — G35 Multiple sclerosis: Secondary | ICD-10-CM | POA: Diagnosis not present

## 2013-01-25 DIAGNOSIS — N319 Neuromuscular dysfunction of bladder, unspecified: Secondary | ICD-10-CM | POA: Diagnosis not present

## 2013-02-21 ENCOUNTER — Other Ambulatory Visit: Payer: Self-pay | Admitting: Internal Medicine

## 2013-02-21 DIAGNOSIS — N319 Neuromuscular dysfunction of bladder, unspecified: Secondary | ICD-10-CM | POA: Diagnosis not present

## 2013-02-21 DIAGNOSIS — R339 Retention of urine, unspecified: Secondary | ICD-10-CM | POA: Diagnosis not present

## 2013-02-22 ENCOUNTER — Other Ambulatory Visit: Payer: Self-pay | Admitting: Internal Medicine

## 2013-02-22 MED ORDER — HYDROCODONE-ACETAMINOPHEN 5-325 MG PO TABS: 1.0000 | ORAL_TABLET | Freq: Four times a day (QID) | ORAL | Status: DC | PRN

## 2013-02-22 NOTE — Telephone Encounter (Signed)
Last visit 12/05/12. Refill?

## 2013-02-26 NOTE — Telephone Encounter (Signed)
Notified patient script ready for pick up. 

## 2013-03-01 DIAGNOSIS — G35 Multiple sclerosis: Secondary | ICD-10-CM | POA: Diagnosis not present

## 2013-03-16 DIAGNOSIS — N319 Neuromuscular dysfunction of bladder, unspecified: Secondary | ICD-10-CM | POA: Diagnosis not present

## 2013-04-03 ENCOUNTER — Telehealth: Payer: Self-pay | Admitting: Neurology

## 2013-04-03 NOTE — Telephone Encounter (Signed)
Patient is having stiffness in both legs/hands, comes on much faster, severe pain, scale of 1-10(over 10,last a couple of hours), wants to talk with Dr Angelica Wilcox about scheduling a baclafen increase.

## 2013-04-03 NOTE — Telephone Encounter (Signed)
I called patient. The patient is having increased pain in the lower extremities, also associated with some increase in muscle tone. The patient may need a readjustment in the baclofen right. She will come in tomorrow at 12 noon for this.

## 2013-04-04 ENCOUNTER — Ambulatory Visit (INDEPENDENT_AMBULATORY_CARE_PROVIDER_SITE_OTHER): Payer: Medicare Other | Admitting: Neurology

## 2013-04-04 ENCOUNTER — Encounter: Payer: Self-pay | Admitting: Neurology

## 2013-04-04 VITALS — BP 119/60 | HR 75

## 2013-04-04 DIAGNOSIS — G35 Multiple sclerosis: Secondary | ICD-10-CM

## 2013-04-04 NOTE — Progress Notes (Signed)
Angelica Wilcox is a 67 year old patient with a history of multiple sclerosis with a spastic quadriparesis. Over the last 2 weeks, the patient has had an increase in discomfort in the hands, lower extremities, with an increase in motor tone in spasticity in the legs. The patient denies any fevers, chills, or change in urine color, or blood in the urine. The patient has a suprapubic catheter in place. The patient denies any decubitus ulcers.  The baclofen pump was interrogated and reprogrammed today, please refer to procedure note.  The patient will contact me if the spasticity and pain does not improve. We may consider urinalysis, urine culture, and blood work at that time. The patient is using hydrocodone, 5 mg 3 times daily and she smokes marijuana on a daily basis for pain control.  The alarm date for the baclofen pump is on 06/04/2013. The patient will need a revisit prior to this for the baclofen pump refill. The patient was getting these refills through [facility], but her physician has moved. The patient wishes to have the procedure done through this office.

## 2013-04-04 NOTE — Procedures (Signed)
  History:   Angelica Wilcox is a 67 year old patient with a history of multiple sclerosis with a spastic quadriparesis. The patient reports a two-week history of increased discomfort involving the legs, arms, with increased spasticity of the legs. The patient is coming in for readjustment of her baclofen pump.  Baclofen pump refill note  The pump was reprogrammed for the following settings: The patient is at a simple continuous rate of 65.07 mcg daily. The rate was increased to 69.91 mcg daily. This represents a 7% increase. The patient has a baclofen at 500 mcg per cc concentration.  The alarm volume is set at 2.0. The next alarm date is 06/04/2013. The pump indicates that the reservoir volume is holding 10.6 cc baclofen.  The patient tolerated the procedure well. There were no complications of the above procedure.

## 2013-04-05 ENCOUNTER — Other Ambulatory Visit: Payer: Self-pay

## 2013-04-09 ENCOUNTER — Telehealth: Payer: Self-pay | Admitting: *Deleted

## 2013-04-17 NOTE — Telephone Encounter (Signed)
Left msg alarm date 06/04/13. Patient needs visit week of 05/21/13. Dr. Anne Hahn on vacation week of 12/29.

## 2013-04-24 ENCOUNTER — Encounter: Payer: Self-pay | Admitting: Neurology

## 2013-04-24 ENCOUNTER — Ambulatory Visit (INDEPENDENT_AMBULATORY_CARE_PROVIDER_SITE_OTHER): Payer: Medicare Other | Admitting: Neurology

## 2013-04-24 VITALS — BP 120/64 | HR 68

## 2013-04-24 DIAGNOSIS — G35 Multiple sclerosis: Secondary | ICD-10-CM

## 2013-04-24 NOTE — Procedures (Signed)
     History:  Angelica Wilcox is a 67 year old patient with a history of multiple sclerosis with a spastic quadriparesis. The patient has had some increased spasticity and pain in the legs, and she comes in today for a readjustment of her baclofen pump rate to help these symptoms.  Baclofen pump refill note  The baclofen pump was reprogrammed today.  The pump was reprogrammed for the following settings: 75.05 mcg/day from 69.91 mcg/day.  The alarm volume is set at 2.0 cc. The next alarm date is 06/01/2013.  The patient tolerated the procedure well. There were no complications of the above procedure.

## 2013-04-24 NOTE — Progress Notes (Signed)
Reason for visit: Multiple sclerosis  Angelica Wilcox is an 67 y.o. female  History of present illness:  Angelica Wilcox is a 67 year old right-handed white female with a history of multiple sclerosis with a spastic quadriparesis. The patient believes that she is gradually worsening with her strength and coordination of her arms. The patient has a baclofen pump in place, and the dosing was readjusted on the last visit with some transient benefit in her spasticity and pain. The patient has had some return of the discomfort. The patient continues to smoke marijuana on a daily basis to help some of the discomfort. The patient has had one fall out of her wheelchair when she was going down a ramp. The patient did not sustain any injury. The patient does have caretakers coming in to help. The patient returns for an evaluation.   Past Medical History  Diagnosis Date  . Dysthymic disorder   . Calculus of gallbladder without mention of cholecystitis or obstruction   . Other constipation   . Contracture of hand joint   . Diverticulitis of colon (without mention of hemorrhage)   . Other malaise and fatigue   . Esophageal reflux   . Headache(784.0)   . Other and unspecified hyperlipidemia   . Unspecified urinary incontinence   . Unspecified essential hypertension   . Multiple sclerosis   . Nonspecific elevation of levels of transaminase or lactic acid dehydrogenase (LDH)   . Osteoporosis, unspecified   . Other abnormality of urination(788.69)   . Rectocele   . Other alteration of consciousness   . Unspecified vitamin D deficiency   . Other B-complex deficiencies   . Dyslipidemia     Past Surgical History  Procedure Laterality Date  . Tubal ligation    . Tonsillectomy and adenoidectomy    . Colon screen  1997  . Carotid doppler  09/2002    Negative  . Dexa  12/2008    OP, slt worse  . Baclofen pump refill  4-24/13    model No. [id number] @ [facility]    Family History  Problem Relation Age  of Onset  . Cancer Father   . Hypertension Father   . Alcohol abuse Father   . Heart failure Father     CHF  . Glaucoma Mother   . Pulmonary fibrosis Mother   . Celiac disease      sibling  . Stroke Daughter     Social history:  reports that she quit smoking about 36 years ago. She has never used smokeless tobacco. She reports that she drinks alcohol. She reports that she uses illicit drugs (Marijuana).    Allergies  Allergen Reactions  . Alendronate Sodium     REACTION: GI  . Atorvastatin     REACTION: myalgia  . Bactrim Nausea And Vomiting  . Ciprofloxacin     REACTION: vomiting    Medications:  Current Outpatient Prescriptions on File Prior to Visit  Medication Sig Dispense Refill  . BACLOFEN IT 65.07 mcg model No. [id number] implant date 09/22/11 At [facility]      . Cholecalciferol (VITAMIN D3) 2000 UNITS TABS Take 1 tablet by mouth daily.       . Cyanocobalamin (B-12) 500 MCG SUBL Place 1 tablet under the tongue daily.  150 tablet  11  . escitalopram (LEXAPRO) 20 MG tablet Take 1 tablet (20 mg total) by mouth daily.  30 tablet  11  . gabapentin (NEURONTIN) 300 MG capsule Take 2 capsules (600 mg total)  by mouth 3 (three) times daily. 2 tablets every morning, 2 tablets at 3pm, and 2 tablets at night  180 capsule  11  . HYDROcodone-acetaminophen (NORCO/VICODIN) 5-325 MG per tablet Take 1 tablet by mouth every 6 (six) hours as needed for pain.  30 tablet  3  . lisinopril (PRINIVIL,ZESTRIL) 20 MG tablet TAKE ONE TABLET BY MOUTH ONE TIME DAILY  30 tablet  5  . LORazepam (ATIVAN) 0.5 MG tablet Take 1 tablet (0.5 mg total) by mouth daily as needed.  30 tablet  3  . nitrofurantoin, macrocrystal-monohydrate, (MACROBID) 100 MG capsule Take 1 capsule (100 mg total) by mouth 2 (two) times daily.  10 capsule  0  . ondansetron (ZOFRAN) 4 MG tablet Take 1 tablet (4 mg total) by mouth every 8 (eight) hours as needed for nausea.  20 tablet  0   No current facility-administered medications on file  prior to visit.    ROS:  Out of a complete 14 system review of symptoms, the patient complains only of the following symptoms, and all other reviewed systems are negative.  Swelling in the feet  Shortness of breath Memory loss, confusion, numbness, weakness, tremor Too much sleep  Blood pressure 120/64, pulse 68, weight 0 lb (0 kg).  Physical Exam  General: The patient is alert and cooperative at the time of the examination.  Skin: No significant peripheral edema is noted.   Neurologic Exam  Mental status: The patient is oriented x 3.  Cranial nerves: Facial symmetry is present. Speech is normal, no aphasia or dysarthria is noted. Extraocular movements are full. Visual fields are full. Pupils are equal, round, and reactive to light. Discs are flat bilaterally.   Motor: The motor testing reveals 5 over 5 strength of the left arm. The patient has good grip strength with the right hand, but she is unable to extend the fingers or the wrist on the right. The patient has 4+/5 strength in the upper arm on the right. The patient has essentially no use of the right leg, the patient has minimal extension of the knee with the left leg, and she can dorsiflex the left foot slightly. There is no adductor or abductor strength on either leg.  Sensory examination: Soft touch sensation is symmetric on the face, arms, and legs.   Coordination: The patient has dysmetria with finger-nose-finger on the right arm, normal on the left. The patient is unable to perform heel-to-shin with either lower extremity.   Gait and station: The patient is wheelchair-bound, could not be ambulated.   Reflexes: Deep tendon reflexes are symmetric, but are depressed.   Assessment/Plan:  1. Multiple sclerosis  2. Spastic quadriparesis  The patient will have the baclofen pump readjusted today. This increase hopefully will help her spasticity and pain level. The patient is on no disease modifying agents for her MS.  The patient has a chronic progressive course of MS. The patient will followup in the near future for a baclofen pump refill.  Marlan Palau MD 04/24/2013 7:08 PM  [facility] [address] [place], [place] [address]  Phone [phone] Fax [phone]

## 2013-04-24 NOTE — Patient Instructions (Signed)
Multiple Sclerosis Multiple sclerosis (MS) is a disease of the central nervous system. Its cause is unknown. It is more common in the northern states than in the southern states. There is a higher incidence of MS in women. There is a wide variation in the symptoms (problems) of MS. This is because of the many different ways it affects the central nervous system. It often comes on in episodes or attacks. These attacks may last weeks to months. There may be long periods of nearly no problems between attacks. The main symptoms include visual problems (associated with eye pain), numbness, weakness, and paralysis in extremities (arms/hands and legs/feet). There may also be tremors and problems with balance and walking. The age when MS starts is variable. Advances in medicine continue to improve the treatment of this illness. There is no known cure for MS but there are medications that help. MS is not an inherited illness, although your risk of getting this disease is higher if you have a relative with MS. The best radiologic (x-ray) study for MS is an MRI (magnetic resonance imaging). There are medications available to decrease the number and frequency of attacks. SYMPTOMS  The symptoms of MS are caused by loss of insulation (myelin) of the nerves of the brain. When this happens, brain signals do not get transmitted properly or may not get transmitted at all. Some of the problems caused by this include:   Numbness.  Weakness.  Paralysis in extremities.  Visual problems, eye pain.  Balance problems.  Tremors. DIAGNOSIS  Your caregiver can do studies on you to make this diagnosis. This may include specialized X-rays and spinal fluid studies. HOME CARE INSTRUCTIONS   Take medications as directed by your caregiver. Baclofen is a drug commonly used to reduce muscle spasticity. Steroids are often used for short term relief.  Exercise as directed.  Use physical and occupational therapy as directed by  your caregiver. Careful attention to this medical care can help avoid depression.  See your caregiver if you begin to have problems with depression. This is a common problem in MS. Patients often continue to work many years after the diagnosis of MS. Document Released: 05/14/2000 Document Revised: 08/09/2011 Document Reviewed: 12/21/2006 [employer] Patient Information 2014 [employer].  

## 2013-05-10 ENCOUNTER — Encounter: Payer: Self-pay | Admitting: *Deleted

## 2013-05-11 ENCOUNTER — Encounter: Payer: Self-pay | Admitting: Internal Medicine

## 2013-05-11 ENCOUNTER — Ambulatory Visit (INDEPENDENT_AMBULATORY_CARE_PROVIDER_SITE_OTHER): Payer: Medicare Other | Admitting: Internal Medicine

## 2013-05-11 VITALS — BP 110/71 | HR 90 | Temp 98.1°F | Resp 12 | Wt 150.0 lb

## 2013-05-11 DIAGNOSIS — Z23 Encounter for immunization: Secondary | ICD-10-CM

## 2013-05-11 DIAGNOSIS — B372 Candidiasis of skin and nail: Secondary | ICD-10-CM

## 2013-05-11 DIAGNOSIS — Z7189 Other specified counseling: Secondary | ICD-10-CM

## 2013-05-11 DIAGNOSIS — Z66 Do not resuscitate: Secondary | ICD-10-CM | POA: Insufficient documentation

## 2013-05-11 DIAGNOSIS — B3749 Other urogenital candidiasis: Secondary | ICD-10-CM

## 2013-05-11 MED ORDER — NYSTATIN 100000 UNIT/GM EX POWD: Freq: Four times a day (QID) | CUTANEOUS | Status: DC

## 2013-05-11 NOTE — Assessment & Plan Note (Addendum)
MOST form filled out today.

## 2013-05-11 NOTE — Progress Notes (Signed)
Patient ID: Angelica Wilcox, female   DOB: 1945/12/15, 67 y.o.   MRN: [id number]     Patient Active Problem List   Diagnosis Date Noted  . Candidiasis of skin 05/13/2013  . Counseling regarding end of life decision making 05/11/2013  . Do not resuscitate 05/11/2013  . Pressure sore 09/15/2012  . Cognitive complaints with normal neuropsychological exam 08/26/2012  . Protein-calorie malnutrition, mild 11/14/2011  . Hypertension 10/02/2010  . 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 Unspecified vitamin D deficiency 07/05/2008  . VITAMIN B12 DEFICIENCY 06/04/2008  . ANXIETY DEPRESSION 05/29/2008  . NONSPEC ELEVATION OF LEVELS OF TRANSAMINASE/LDH 09/06/2007  . DIVERTICULITIS, COLON 08/03/2007  . CHOLELITHIASIS, ASYMPTOMATIC 08/03/2007  . HYPERLIPIDEMIA 11/04/2006  . Multiple sclerosis, primary progressive 11/04/2006  . GERD 11/04/2006  . PROLAPSE, VAGINAL WALL, RECTOCELE 11/04/2006  . OSTEOPOROSIS 11/04/2006  . Neurogenic bladder 11/04/2006    Subjective:  CC:   Chief Complaint  Patient presents with  . MOST form  . Rash    bilateral groin ? rash/pressure ulcer x 2 weeks. Non itching, not painful    HPI:   Angelica Gaspari Sanfordis a 67 y.o. female who presents here to discuss end of life issues.  She has brought with her the MOST form after discussing with Vickey Huger with Hospice.  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 "My life is difficult on a day to day basis and I am getting tired." She does not want to have any life prolonging measures except antibiotics if indicated for infection.  No CPR, feeding tubes.   2) red rash inguinal area for the past 2  weeks . Area is red,  Itchy,  And has been peeling.  Area is frequently moist.    Past Medical History  Diagnosis Date  . Dysthymic disorder   . Calculus of gallbladder without mention of cholecystitis or obstruction   . Other constipation   . Contracture of hand joint   .  Diverticulitis of colon (without mention of hemorrhage)   . Other malaise and fatigue   . Esophageal reflux   . Headache(784.0)   . Other and unspecified hyperlipidemia   . Unspecified urinary incontinence   . Unspecified essential hypertension   . Multiple sclerosis   . Nonspecific elevation of levels of transaminase or lactic acid dehydrogenase (LDH)   . Osteoporosis, unspecified   . Other abnormality of urination(788.69)   . Rectocele   . Other alteration of consciousness   . Unspecified vitamin D deficiency   . Other B-complex deficiencies   . Dyslipidemia     Past Surgical History  Procedure Laterality Date  . Tubal ligation    . Tonsillectomy and adenoidectomy    . Colon screen  1997  . Carotid doppler  09/2002    Negative  . Dexa  12/2008    OP, slt worse  . Baclofen pump refill  4-24/13    model No. [id number] @ [facility]       The following portions of the patient's history were reviewed and updated as appropriate: Allergies, current medications, and problem list.    Review of Systems:   12 Pt  review of systems was negative except those addressed in the HPI,     History   Social History  . Marital Status: Widowed    Spouse Name: N/A    Number of Children: N/A  . Years of Education: N/A   Occupational History  .  Disabled    Social History Main Topics  . Smoking status: Former Smoker    Quit date: 05/31/1976  . Smokeless tobacco: Never Used  . Alcohol Use: Yes     Comment: rarely drinks alcohol, The patient uses daily marijuana.  . Drug Use: Yes    Special: Marijuana  . Sexual Activity: Not on file   Other Topics Concern  . Not on file   Social History Narrative   Disabled from MS-wheelchair bound    Objective:  Filed Vitals:   05/11/13 1002  BP: 110/71  Pulse: 90  Temp: 98.1 F (36.7 C)  Resp: 12     General appearance: alert, cooperative and appears stated age Ears: normal TM's and external ear canals both ears Throat: lips,  mucosa, and tongue normal; teeth and gums normal Neck: no adenopathy, no carotid bruit, supple, symmetrical, trachea midline and thyroid not enlarged, symmetric, no tenderness/mass/nodules Back: symmetric, no curvature. ROM normal. No CVA tenderness. Lungs: clear to auscultation bilaterally Heart: regular rate and rhythm, S1, S2 normal, no murmur, click, rub or gallop Abdomen: soft, non-tender; bowel sounds normal; no masses,  no organomegaly Pulses: 2+ and symmetric Skin: Skin color, texture, turgor normal. No rashes or lesions Lymph nodes: Cervical, supraclavicular, and axillary nodes normal.  Assessment and Plan:  Counseling regarding end of life decision making MOST form filled out today.    Candidiasis of skin Inguinal area.  Nystatin powder bid.   A total of 30  minutes was spent with patient more than half of which was spent in counseling, reviewing records from other prviders and coordination of care.  Updated Medication List Outpatient Encounter Prescriptions as of 05/11/2013  Medication Sig  . BACLOFEN IT 65.07 mcg model No. [id number] implant date 09/22/11 At [facility]  . Cholecalciferol (VITAMIN D3) 2000 UNITS TABS Take 1 tablet by mouth daily.   . cyanocobalamin 2000 MCG tablet Take 2,000 mcg by mouth daily.  Marland Kitchen escitalopram (LEXAPRO) 20 MG tablet Take 1 tablet (20 mg total) by mouth daily.  Marland Kitchen gabapentin (NEURONTIN) 300 MG capsule Take 2 capsules (600 mg total) by mouth 3 (three) times daily. 2 tablets every morning, 2 tablets at 3pm, and 2 tablets at night  . HYDROcodone-acetaminophen (NORCO/VICODIN) 5-325 MG per tablet Take 1 tablet by mouth every 6 (six) hours as needed for pain.  Marland Kitchen lisinopril (PRINIVIL,ZESTRIL) 20 MG tablet TAKE ONE TABLET BY MOUTH ONE TIME DAILY  . LORazepam (ATIVAN) 0.5 MG tablet Take 1 tablet (0.5 mg total) by mouth daily as needed.  . nystatin (MYCOSTATIN) powder Apply topically 4 (four) times daily. To inguinal area until rash resolves  . ondansetron  (ZOFRAN) 4 MG tablet Take 1 tablet (4 mg total) by mouth every 8 (eight) hours as needed for nausea.  . [DISCONTINUED] Cyanocobalamin (B-12) 500 MCG SUBL Place 1 tablet under the tongue daily.  . [DISCONTINUED] nitrofurantoin, macrocrystal-monohydrate, (MACROBID) 100 MG capsule Take 1 capsule (100 mg total) by mouth 2 (two) times daily.     Orders Placed This Encounter  Procedures  . Pneumococcal conjugate vaccine 13-valent    No Follow-up on file.

## 2013-05-11 NOTE — Patient Instructions (Signed)
I am treating you for a skinf infection caused by Candida (a yeast)  Use the nystatin powder up to 4 times daily until the redness resolves Keep the area as dry as possible  Using 4 x 4 gauze pads Use Gold bond medicated powder instead of corn starch once resolved Use sterile saline to clean area until the irritation resolves.    Your MOST form will remain in effect until you revoke it.   Cutaneous Candidiasis Cutaneous candidiasis is a condition in which there is an overgrowth of yeast (candida) on the skin. Yeast normally live on the skin, but in small enough numbers not to cause any symptoms. In certain cases, increased growth of the yeast may cause an actual yeast infection. This kind of infection usually occurs in areas of the skin that are constantly warm and moist, such as the armpits or the groin. Yeast is the most common cause of diaper rash in babies and in people who cannot control their bowel movements (incontinence). CAUSES  The fungus that most often causes cutaneous candidiasis is Candida albicans. Conditions that can increase the risk of getting a yeast infection of the skin include:  Obesity.  Pregnancy.  Diabetes.  Taking antibiotic medicine.  Taking birth control pills.  Taking steroid medicines.  Thyroid disease.  An iron or zinc deficiency.  Problems with the immune system. SYMPTOMS   Red, swollen area of the skin.  Bumps on the skin.  Itchiness. DIAGNOSIS  The diagnosis of cutaneous candidiasis is usually based on its appearance. Light scrapings of the skin may also be taken and viewed under a microscope to identify the presence of yeast. TREATMENT  Antifungal creams may be applied to the infected skin. In severe cases, oral medicines may be needed.  HOME CARE INSTRUCTIONS   Keep your skin clean and dry.  Maintain a healthy weight.  If you have diabetes, keep your blood sugar under control. SEEK IMMEDIATE MEDICAL CARE IF:  Your rash continues  to spread despite treatment.  You have a fever, chills, or abdominal pain. Document Released: 02/02/2011 Document Revised: 08/09/2011 Document Reviewed: 02/02/2011 [facility] Patient Information 2014 [place], [place].

## 2013-05-11 NOTE — Progress Notes (Signed)
Pre visit review using our clinic review tool, if applicable. No additional management support is needed unless otherwise documented below in the visit note. 

## 2013-05-13 ENCOUNTER — Encounter: Payer: Self-pay | Admitting: Internal Medicine

## 2013-05-13 DIAGNOSIS — B372 Candidiasis of skin and nail: Secondary | ICD-10-CM | POA: Insufficient documentation

## 2013-05-13 NOTE — Assessment & Plan Note (Signed)
Inguinal area.  Nystatin powder bid.

## 2013-05-17 ENCOUNTER — Encounter: Payer: Self-pay | Admitting: Neurology

## 2013-05-17 ENCOUNTER — Ambulatory Visit (INDEPENDENT_AMBULATORY_CARE_PROVIDER_SITE_OTHER): Payer: Medicare Other | Admitting: Neurology

## 2013-05-17 VITALS — BP 132/71 | HR 85

## 2013-05-17 DIAGNOSIS — G35 Multiple sclerosis: Secondary | ICD-10-CM | POA: Diagnosis not present

## 2013-05-17 NOTE — Progress Notes (Signed)
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 The patient comes in today for a baclofen pump refill. The patient reports that she has had onset of some sinus pressure and flulike symptoms this morning. The patient believes that the most recent baclofen pump settings have been somewhat of a benefit. No other significant problems have occurred since last seen. The patient once again indicates that she is gradually losing the use of her hands.

## 2013-05-17 NOTE — Procedures (Signed)
     History:  Angelica Wilcox is a 67 year old patient with a progressive form of multiple sclerosis. The patient has a spastic quadriparesis. The patient has a baclofen pump in place, and she comes in for a baclofen pump refill.  Baclofen pump refill note  The baclofen pump site was cleaned with Betadine solution. A 21-gauge needle was inserted into the pump port site. Approximately 5 cc of residual baclofen was removed. 20 cc of replacement baclofen was placed into the pump at 500 mcg/cc concentration.  The pump was reprogrammed for the following settings: Simple continuous rate at 75.05 mcg per day. The rate was not changed from the prior settings.  The alarm volume is set at 2.0 cc. The next alarm date is 09/13/2013.  The patient tolerated the procedure well. There were no complications of the above procedure.

## 2013-05-18 ENCOUNTER — Telehealth: Payer: Self-pay | Admitting: Internal Medicine

## 2013-05-18 DIAGNOSIS — R339 Retention of urine, unspecified: Secondary | ICD-10-CM | POA: Diagnosis not present

## 2013-05-18 NOTE — Telephone Encounter (Signed)
Patient Information:  Caller Name: [place]  Phone: [phone]  Patient: Angelica Wilcox, Angelica Wilcox  Gender: Female  DOB: Mar 16, 1946  Age: 67 Years  PCP: Duncan Dull (Adults only)  Office Follow Up:  Does the office need to follow up with this patient?: No  Instructions For The Office: N/A   Symptoms  Reason For Call & Symptoms: Pt calling that 05/17/13 and she felt like flu -like SX.  She had diarrhea and vomiting.  No vomiting or diarrhea today.   She got a Cold and Flu medicine and can she take it with her other meds?  She has MS and is wheelchair bound.   Reviewed Health History In EMR: Yes  Reviewed Medications In EMR: Yes  Reviewed Allergies In EMR: Yes  Reviewed Surgeries / Procedures: Yes  Date of Onset of Symptoms: 05/17/2013  Guideline(s) Used:  No Protocol Available - Information Only  Disposition Per Guideline:   Home Care  Reason For Disposition Reached:   Information only question and nurse able to answer  Advice Given:  Call Back If:  New symptoms develop  You become worse. Sounds like pt had a 24 hr gastritis and does not have cold sx except for a rare cough.  Instructed her to call her RPh. To see if she ahs further cold SX and takes this Cold and Flu medication will it interfere with the medications she is on now.  Patient Will Follow Care Advice:  YES

## 2013-05-18 NOTE — Telephone Encounter (Signed)
FYI

## 2013-05-21 ENCOUNTER — Other Ambulatory Visit: Payer: Self-pay | Admitting: Adult Health

## 2013-05-22 ENCOUNTER — Other Ambulatory Visit: Payer: Self-pay | Admitting: *Deleted

## 2013-05-22 MED ORDER — LORAZEPAM 0.5 MG PO TABS: 0.5000 mg | ORAL_TABLET | Freq: Every day | ORAL | Status: DC | PRN

## 2013-05-22 MED ORDER — ESCITALOPRAM OXALATE 20 MG PO TABS: 20.0000 mg | ORAL_TABLET | Freq: Every day | ORAL | Status: DC

## 2013-05-22 MED ORDER — HYDROCODONE-ACETAMINOPHEN 5-325 MG PO TABS: 1.0000 | ORAL_TABLET | Freq: Four times a day (QID) | ORAL | Status: DC | PRN

## 2013-05-22 NOTE — Telephone Encounter (Signed)
Fine to fill both. Needs UDS and to sign contract if she has not done so.

## 2013-05-22 NOTE — Telephone Encounter (Signed)
Left message, notifying Rx ready for pickup 

## 2013-05-22 NOTE — Telephone Encounter (Signed)
Has this been refilled? I tried to approve refill but it stated it was a duplicate.

## 2013-05-22 NOTE — Telephone Encounter (Signed)
Pt called back to say she didn't need lexapro  [facility]  Pt would like to know if she could have a friend pick this up  She would like to have this picked up today if possible

## 2013-05-22 NOTE — Telephone Encounter (Signed)
Pt left VM, requesting refill on Lexapro, Lorazepam, and Hydrocodone refills. Ok to refill?

## 2013-05-23 NOTE — Telephone Encounter (Signed)
Refill sent to pharmacy on 05/22/13

## 2013-05-28 ENCOUNTER — Encounter: Payer: Self-pay | Admitting: *Deleted

## 2013-05-28 ENCOUNTER — Telehealth: Payer: Self-pay | Admitting: Internal Medicine

## 2013-05-28 NOTE — Telephone Encounter (Signed)
Patient wanting a referral to [facility] pain clinic . The patient has a Baclofen pump.

## 2013-05-28 NOTE — Telephone Encounter (Signed)
Sent pt a mychart message. 

## 2013-05-28 NOTE — Telephone Encounter (Signed)
In reviewing the chart, it appears her neurologist adjust her baclofen pump settings.  If she is having acute issues with her pain meds, would recommend her letting him know.  It will take a while to get an appt at pain clinic.  Also, regarding the referral - can discuss with Dr Darrick Huntsman and/or Dr Anne Hahn for long term treatment plan.  I doubt they would have a problem with referral.

## 2013-05-28 NOTE — Telephone Encounter (Signed)
Pt sent my chart message (see below)is it ok to schedule with raquel  Pt has appointment with dr Darrick Huntsman 06/06/13 Thanks Robin  having multiple issues. the newest is a change in my pain medication that has stopped being effective. my pain levels are escalating. in addition to a yeast infection, an inflamed place on my neck. and a catheter that is leaking.

## 2013-05-30 ENCOUNTER — Encounter: Payer: Self-pay | Admitting: *Deleted

## 2013-05-30 ENCOUNTER — Encounter: Payer: Self-pay | Admitting: Adult Health

## 2013-05-30 ENCOUNTER — Ambulatory Visit (INDEPENDENT_AMBULATORY_CARE_PROVIDER_SITE_OTHER): Payer: Medicare Other | Admitting: Adult Health

## 2013-05-30 VITALS — BP 136/78 | HR 76 | Temp 98.1°F | Resp 12 | Wt 150.0 lb

## 2013-05-30 DIAGNOSIS — G8929 Other chronic pain: Secondary | ICD-10-CM

## 2013-05-30 DIAGNOSIS — L259 Unspecified contact dermatitis, unspecified cause: Secondary | ICD-10-CM

## 2013-05-30 DIAGNOSIS — L309 Dermatitis, unspecified: Secondary | ICD-10-CM | POA: Insufficient documentation

## 2013-05-30 MED ORDER — DERMACLOUD EX CREA: TOPICAL_CREAM | CUTANEOUS | Status: DC

## 2013-05-30 NOTE — Patient Instructions (Signed)
  Apply Dermacloud (Fanny Cream) to affected area 2-3 times a day.  If area not improved within 1 week please let us know.  I am referring you to the Pain Clinic at [facility]. They will contact you with an appointment.

## 2013-05-30 NOTE — Assessment & Plan Note (Signed)
Associated with progression of MS, contractures. Pt has baclofen pump. Has been taken hydrocodone for pain but no longer helping. Ref to pain clinic at [facility].

## 2013-05-30 NOTE — Assessment & Plan Note (Signed)
Irritation of skin on groin 2/2 immobility, moisture. Has been using nystatin without improvement. Will try Dermacloud 2-3 times a day for barrier. She has been having some leakage from urethra 2/2 clogging of suprapubic catheter. Already has appt to see her urologist.

## 2013-05-30 NOTE — Progress Notes (Signed)
   Subjective:    Patient ID: Angelica Wilcox, female    DOB: 09-11-1945, 67 y.o.   MRN: [id number]  HPI  Patient is a pleasant 67 y/o female who presents to clinic with the following concerns:  1. Lump in throat - this has resolved. She feels she just got over the flu and had an area on the left side of neck that was swollen.  2. Yeast in groin - Has been using nystatin powder but no improvement. She reports that her skin is still red and irritated. Feels she needs something else.  3. Request referral to pain clinic  - Having increasing pain. Has a baclofen pump which is being followed by Dr. Anne Hahn, Neurology. She is experiencing pain from her toes up to her rib cage. Feels somewhat of a compression feeling. She is also feeling numbness and tingling. Hx of MS. She reports taking hydrocodone with really good results; however, she is no longer getting relief. She believes this may be natural progression of her MS but she cannot live with the excruciating pain that she has been experiencing. Occasionally reports pain 10/10.   Review of Systems  HENT: Negative.   Respiratory: Negative.   Cardiovascular: Negative.   Genitourinary:       Suprapubic catheter. Has appointment with urology next week. Has been having problems with catheter clogging. Experiencing leaking from urethra.  Musculoskeletal:       In a motorized wheelchair. Contractures of the right upper extremity. Experiencing pain from feet to chest area.  Skin:       Irritation of skin on groin bilaterally  Neurological: Negative.   Psychiatric/Behavioral: Negative.   All other systems reviewed and are negative.       Objective:   Physical Exam  Constitutional: She is oriented to person, place, and time.  No distress. Pt in a motorized wheelchair. Hx of MS.   HENT:  Head: Normocephalic and atraumatic.  Mouth/Throat: Oropharynx is clear and moist.  Neck: No tracheal deviation present.  Cardiovascular: Normal rate and  regular rhythm.   Pulmonary/Chest: Effort normal. No respiratory distress.  Musculoskeletal:  Contractures of upper right extremity. Wheelchair to bed existence 2/2 MS  Lymphadenopathy:    She has no cervical adenopathy.  Neurological: She is alert and oriented to person, place, and time.  Skin: There is erythema.  Erythema and irritation of skin on bilateral groin. Has been using nystatin powder without any improvement.  Psychiatric: She has a normal mood and affect. Her behavior is normal. Judgment and thought content normal.          Assessment & Plan:

## 2013-06-01 NOTE — Telephone Encounter (Signed)
Pt seen in office 05/30/13 

## 2013-06-06 ENCOUNTER — Other Ambulatory Visit: Payer: Self-pay | Admitting: Internal Medicine

## 2013-06-06 ENCOUNTER — Encounter: Payer: Self-pay | Admitting: Internal Medicine

## 2013-06-06 ENCOUNTER — Ambulatory Visit (INDEPENDENT_AMBULATORY_CARE_PROVIDER_SITE_OTHER): Payer: Medicare Other | Admitting: Internal Medicine

## 2013-06-06 VITALS — BP 138/78 | HR 69 | Temp 97.6°F | Resp 14

## 2013-06-06 DIAGNOSIS — N3941 Urge incontinence: Secondary | ICD-10-CM | POA: Diagnosis not present

## 2013-06-06 DIAGNOSIS — G8929 Other chronic pain: Secondary | ICD-10-CM

## 2013-06-06 DIAGNOSIS — G894 Chronic pain syndrome: Secondary | ICD-10-CM | POA: Diagnosis not present

## 2013-06-06 DIAGNOSIS — G35 Multiple sclerosis: Secondary | ICD-10-CM | POA: Diagnosis not present

## 2013-06-06 DIAGNOSIS — L259 Unspecified contact dermatitis, unspecified cause: Secondary | ICD-10-CM | POA: Diagnosis not present

## 2013-06-06 DIAGNOSIS — L309 Dermatitis, unspecified: Secondary | ICD-10-CM

## 2013-06-06 DIAGNOSIS — N319 Neuromuscular dysfunction of bladder, unspecified: Secondary | ICD-10-CM | POA: Diagnosis not present

## 2013-06-06 NOTE — Progress Notes (Signed)
Patient ID: Angelica Wilcox, female   DOB: Jul 09, 1945, 68 y.o.   MRN: [id number]   Patient Active Problem List   Diagnosis Date Noted  . Chronic pain 05/30/2013  . Dermatitis 05/30/2013  . Candidiasis of skin 05/13/2013  . Counseling regarding end of life decision making 05/11/2013  . Do not resuscitate 05/11/2013  . Pressure sore 09/15/2012  . Cognitive complaints with normal neuropsychological exam 08/26/2012  . Protein-calorie malnutrition, mild 11/14/2011  . Hypertension 10/02/2010  . 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 Unspecified vitamin D deficiency 07/05/2008  . VITAMIN B12 DEFICIENCY 06/04/2008  . ANXIETY DEPRESSION 05/29/2008  . NONSPEC ELEVATION OF LEVELS OF TRANSAMINASE/LDH 09/06/2007  . DIVERTICULITIS, COLON 08/03/2007  . CHOLELITHIASIS, ASYMPTOMATIC 08/03/2007  . HYPERLIPIDEMIA 11/04/2006  . Multiple sclerosis, primary progressive 11/04/2006  . GERD 11/04/2006  . PROLAPSE, VAGINAL WALL, RECTOCELE 11/04/2006  . OSTEOPOROSIS 11/04/2006  . Neurogenic bladder 11/04/2006    Subjective:  CC:   Chief Complaint  Patient presents with  . Follow-up    6 month up on flu and yeast infection.    HPI:   Angelica Pulis Sanfordis a 68 y.o. female who presents Follow up on candidiasis of groin and other intertrigonal areas.  She is feeling much better.  In he interim she had a flu like illness and has recovered from the flu.  T  The rash is improving slowly.  She has begun using Dermacloud prescribed by Raquel along with nystatin.    Pain issues. She had been using up to 2 hydrocodone daily to [facility] her chronic pain associated with MS but developed increased pain around Christmas time which no longer responded to vicodin even in increased doses, so she abruptly stopped it,.  The pain previously affected only her legs but now affects her hans and arms and she is losing the use of her both.  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 She has been using the baclofen  pump,  Gabapentin, and marijuana. The pain has been slowly improving except for today when she rates it as a 3/10 today.  She has been referred to Pain clinic but was unaware that her use of marijuana would not be condoned by the providers there.  The marijuana provides considerable pain relief and she is not willing to give it up despite its illicit nature.   She saw the urologist today for increased sediment and leaking around the catheter.  The sediment has cleared up,  The bag was changed  And he changed the size of the tubing to a larger size.   A urine sample was taken today for culture.        Past Medical History  Diagnosis Date  . Dysthymic disorder   . Calculus of gallbladder without mention of cholecystitis or obstruction   . Other constipation   . Contracture of hand joint   . Diverticulitis of colon (without mention of hemorrhage)   . Other malaise and fatigue   . Esophageal reflux   . Headache(784.0)   . Other and unspecified hyperlipidemia   . Unspecified urinary incontinence   . Unspecified essential hypertension   . Multiple sclerosis   . Nonspecific elevation of levels of transaminase or lactic acid dehydrogenase (LDH)   . Osteoporosis, unspecified   . Other abnormality of urination(788.69)   . Rectocele   . Other alteration of consciousness   . Unspecified vitamin D deficiency   . Other B-complex deficiencies   . Dyslipidemia  Past Surgical History  Procedure Laterality Date  . Tubal ligation    . Tonsillectomy and adenoidectomy    . Colon screen  1997  . Carotid doppler  09/2002    Negative  . Dexa  12/2008    OP, slt worse  . Baclofen pump refill  4-24/13    model No. [id number] @ [facility]       The following portions of the patient's history were reviewed and updated as appropriate: Allergies, current medications, and problem list.    Review of Systems:   12 Pt  review of systems was negative except those addressed in the HPI,     History    Social History  . Marital Status: Widowed    Spouse Name: N/A    Number of Children: N/A  . Years of Education: N/A   Occupational History  . Disabled    Social History Main Topics  . Smoking status: Former Smoker    Quit date: 05/31/1976  . Smokeless tobacco: Never Used  . Alcohol Use: Yes     Comment: rarely drinks alcohol, The patient uses daily marijuana.  . Drug Use: Yes    Special: Marijuana  . Sexual Activity: Not on file   Other Topics Concern  . Not on file   Social History Narrative   Disabled from MS-wheelchair bound    Objective:  Filed Vitals:   06/06/13 1031  BP: 138/78  Pulse: 69  Temp: 97.6 F (36.4 C)  Resp: 14     General appearance: alert, cooperative and appears stated age. Seated in wheelchair.  Ears: normal TM's and external ear canals both ears Throat: lips, mucosa, and tongue normal; teeth and gums normal Neck: no adenopathy, no carotid bruit, supple, symmetrical, trachea midline and thyroid not enlarged, symmetric, no tenderness/mass/nodules Back: symmetric, no curvature. ROM normal. No CVA tenderness. Lungs: clear to auscultation bilaterally Heart: regular rate and rhythm, S1, S2 normal, no murmur, click, rub or gallop Abdomen: soft, non-tender; bowel sounds normal; no masses,  no organomegaly Pulses: 2+ and symmetric Skin: Skin color, texture, turgor normal. No rashes or lesions Lymph nodes: Cervical, supraclavicular, and axillary nodes normal. MSL: right hadn immobile secondary to contractures,  Spastic paralysis of bilateral legs   Assessment and Plan:  Multiple sclerosis, primary progressive She is experiencing increased pain and decreased mobility.  Referral to Hospice and Palliative Care discussed today as an alternative to the Pain Clinic.   Dermatitis Multifactorial, secondary to candida and urinary irritation .  Improved with additional of dermacloud,.   Chronic pain Given the progressive nature of her disease ,  referral to Hospice and Palliative Care was offerred and accepted.   A total of 40 minutes was spent with patient more than half of which was spent in counseling, reviewing records from other prviders and coordination of care.  Updated Medication List Outpatient Encounter Prescriptions as of 06/06/2013  Medication Sig  . BACLOFEN IT 65.07 mcg model No. [id number] implant date 09/22/11 At [facility]  . Cholecalciferol (VITAMIN D3) 2000 UNITS TABS Take 1 tablet by mouth daily.   . cyanocobalamin 2000 MCG tablet Take 2,000 mcg by mouth daily.  Marland Kitchen escitalopram (LEXAPRO) 20 MG tablet Take 1 tablet (20 mg total) by mouth daily.  Marland Kitchen gabapentin (NEURONTIN) 300 MG capsule Take 2 capsules (600 mg total) by mouth 3 (three) times daily. 2 tablets every morning, 2 tablets at 3pm, and 2 tablets at night  . Infant Care Products (DERMACLOUD) CREA Apply to affected areas 2-3  times a day.  . lisinopril (PRINIVIL,ZESTRIL) 20 MG tablet TAKE ONE TABLET BY MOUTH ONE TIME DAILY   . LORazepam (ATIVAN) 0.5 MG tablet Take 1 tablet (0.5 mg total) by mouth daily as needed.  Marland Kitchen HYDROcodone-acetaminophen (NORCO/VICODIN) 5-325 MG per tablet Take 1 tablet by mouth every 6 (six) hours as needed.  . nystatin (MYCOSTATIN) powder Apply topically 4 (four) times daily. To inguinal area until rash resolves  . ondansetron (ZOFRAN) 4 MG tablet Take 1 tablet (4 mg total) by mouth every 8 (eight) hours as needed for nausea.     Orders Placed This Encounter  Procedures  . Ambulatory referral to Hospice    No Follow-up on file.

## 2013-06-06 NOTE — Progress Notes (Signed)
Pre-visit discussion using our clinic review tool. No additional management support is needed unless otherwise documented below in the visit note.  

## 2013-06-06 NOTE — Patient Instructions (Addendum)
  Ask the Pain clinic about adding Cymbalta, or Relafen .  Both are non narcotics ,  Take once or twice daily for chronic pain  And can be combined with your other medications.    We are also placing a referral to  Efraim Kaufmann, MD at the Palliative care clinic.  I think you will find her to be more compassionate and open minded

## 2013-06-07 ENCOUNTER — Encounter: Payer: Self-pay | Admitting: Internal Medicine

## 2013-06-07 DIAGNOSIS — G894 Chronic pain syndrome: Secondary | ICD-10-CM | POA: Diagnosis not present

## 2013-06-07 DIAGNOSIS — N390 Urinary tract infection, site not specified: Secondary | ICD-10-CM | POA: Diagnosis not present

## 2013-06-07 DIAGNOSIS — G35 Multiple sclerosis: Secondary | ICD-10-CM | POA: Diagnosis not present

## 2013-06-07 NOTE — Assessment & Plan Note (Addendum)
Multifactorial, secondary to candida and urinary irritation .  Improved with additional of dermacloud,.

## 2013-06-07 NOTE — Assessment & Plan Note (Signed)
Given the progressive nature of her disease , referral to Hospice and Palliative Care was offerred and accepted.

## 2013-06-07 NOTE — Assessment & Plan Note (Signed)
She is experiencing increased pain and decreased mobility.  Referral to Hospice and Palliative Care discussed today as an alternative to the Pain Clinic.

## 2013-06-08 DIAGNOSIS — G894 Chronic pain syndrome: Secondary | ICD-10-CM | POA: Diagnosis not present

## 2013-06-08 DIAGNOSIS — G35 Multiple sclerosis: Secondary | ICD-10-CM | POA: Diagnosis not present

## 2013-06-10 DIAGNOSIS — G894 Chronic pain syndrome: Secondary | ICD-10-CM | POA: Diagnosis not present

## 2013-06-10 DIAGNOSIS — G35 Multiple sclerosis: Secondary | ICD-10-CM | POA: Diagnosis not present

## 2013-06-12 DIAGNOSIS — G894 Chronic pain syndrome: Secondary | ICD-10-CM | POA: Diagnosis not present

## 2013-06-12 DIAGNOSIS — G35 Multiple sclerosis: Secondary | ICD-10-CM | POA: Diagnosis not present

## 2013-06-13 DIAGNOSIS — G894 Chronic pain syndrome: Secondary | ICD-10-CM | POA: Diagnosis not present

## 2013-06-13 DIAGNOSIS — G35 Multiple sclerosis: Secondary | ICD-10-CM | POA: Diagnosis not present

## 2013-06-14 DIAGNOSIS — G894 Chronic pain syndrome: Secondary | ICD-10-CM | POA: Diagnosis not present

## 2013-06-14 DIAGNOSIS — G35 Multiple sclerosis: Secondary | ICD-10-CM | POA: Diagnosis not present

## 2013-06-15 DIAGNOSIS — N319 Neuromuscular dysfunction of bladder, unspecified: Secondary | ICD-10-CM | POA: Diagnosis not present

## 2013-06-16 DIAGNOSIS — G894 Chronic pain syndrome: Secondary | ICD-10-CM | POA: Diagnosis not present

## 2013-06-16 DIAGNOSIS — G35 Multiple sclerosis: Secondary | ICD-10-CM | POA: Diagnosis not present

## 2013-06-17 DIAGNOSIS — G894 Chronic pain syndrome: Secondary | ICD-10-CM | POA: Diagnosis not present

## 2013-06-17 DIAGNOSIS — G35 Multiple sclerosis: Secondary | ICD-10-CM | POA: Diagnosis not present

## 2013-06-18 DIAGNOSIS — G35 Multiple sclerosis: Secondary | ICD-10-CM | POA: Diagnosis not present

## 2013-06-18 DIAGNOSIS — G894 Chronic pain syndrome: Secondary | ICD-10-CM | POA: Diagnosis not present

## 2013-06-19 DIAGNOSIS — G35 Multiple sclerosis: Secondary | ICD-10-CM | POA: Diagnosis not present

## 2013-06-19 DIAGNOSIS — G894 Chronic pain syndrome: Secondary | ICD-10-CM | POA: Diagnosis not present

## 2013-06-21 ENCOUNTER — Ambulatory Visit: Payer: Self-pay | Admitting: Internal Medicine

## 2013-06-21 DIAGNOSIS — G35 Multiple sclerosis: Secondary | ICD-10-CM | POA: Diagnosis not present

## 2013-06-21 DIAGNOSIS — G894 Chronic pain syndrome: Secondary | ICD-10-CM | POA: Diagnosis not present

## 2013-06-22 DIAGNOSIS — G894 Chronic pain syndrome: Secondary | ICD-10-CM | POA: Diagnosis not present

## 2013-06-22 DIAGNOSIS — G35 Multiple sclerosis: Secondary | ICD-10-CM | POA: Diagnosis not present

## 2013-06-23 DIAGNOSIS — G35 Multiple sclerosis: Secondary | ICD-10-CM | POA: Diagnosis not present

## 2013-06-23 DIAGNOSIS — G894 Chronic pain syndrome: Secondary | ICD-10-CM | POA: Diagnosis not present

## 2013-06-24 DIAGNOSIS — G35 Multiple sclerosis: Secondary | ICD-10-CM | POA: Diagnosis not present

## 2013-06-24 DIAGNOSIS — G894 Chronic pain syndrome: Secondary | ICD-10-CM | POA: Diagnosis not present

## 2013-06-25 DIAGNOSIS — G894 Chronic pain syndrome: Secondary | ICD-10-CM | POA: Diagnosis not present

## 2013-06-25 DIAGNOSIS — G35 Multiple sclerosis: Secondary | ICD-10-CM | POA: Diagnosis not present

## 2013-06-26 DIAGNOSIS — G35 Multiple sclerosis: Secondary | ICD-10-CM | POA: Diagnosis not present

## 2013-06-26 DIAGNOSIS — G894 Chronic pain syndrome: Secondary | ICD-10-CM | POA: Diagnosis not present

## 2013-06-28 DIAGNOSIS — G35 Multiple sclerosis: Secondary | ICD-10-CM | POA: Diagnosis not present

## 2013-06-28 DIAGNOSIS — G894 Chronic pain syndrome: Secondary | ICD-10-CM | POA: Diagnosis not present

## 2013-06-29 DIAGNOSIS — G35 Multiple sclerosis: Secondary | ICD-10-CM | POA: Diagnosis not present

## 2013-06-29 DIAGNOSIS — G894 Chronic pain syndrome: Secondary | ICD-10-CM | POA: Diagnosis not present

## 2013-06-30 DIAGNOSIS — G35 Multiple sclerosis: Secondary | ICD-10-CM | POA: Diagnosis not present

## 2013-06-30 DIAGNOSIS — G894 Chronic pain syndrome: Secondary | ICD-10-CM | POA: Diagnosis not present

## 2013-07-01 ENCOUNTER — Ambulatory Visit: Payer: Self-pay | Admitting: Internal Medicine

## 2013-07-01 DIAGNOSIS — G894 Chronic pain syndrome: Secondary | ICD-10-CM | POA: Diagnosis not present

## 2013-07-01 DIAGNOSIS — G35 Multiple sclerosis: Secondary | ICD-10-CM | POA: Diagnosis not present

## 2013-07-10 ENCOUNTER — Telehealth: Payer: Self-pay | Admitting: *Deleted

## 2013-07-10 NOTE — Telephone Encounter (Signed)
Helene Kelp with hospice called and stated the on call doctor this week end prescribed bactrim for a uti in patient within one hour of taking the  Bactrim patient becomes nausea / emesis. Hospice held bactrim last night until MD could be reached reported urine is clearer than before starting medication. Frequency still persist and would like tpo know if another ABX could be called in for patient please advise.

## 2013-07-10 NOTE — Telephone Encounter (Signed)
Notified Helene Kelp the Hospice Nurse and she stated she was on her way to see patient this morning and if patient does not have zofran 4 mg on hand she will have it called in as advised FYI.

## 2013-07-10 NOTE — Telephone Encounter (Signed)
She  had a similar reaction to cipro.  Theses are intolerances,  Not allergies..  Needs to take zofran 4 mg prior to doses and take abx on full stomach.  Does she need the zofran called in?Marland Kitchen

## 2013-07-13 DIAGNOSIS — N319 Neuromuscular dysfunction of bladder, unspecified: Secondary | ICD-10-CM | POA: Diagnosis not present

## 2013-07-29 ENCOUNTER — Ambulatory Visit: Payer: Self-pay | Admitting: Internal Medicine

## 2013-07-29 DIAGNOSIS — G894 Chronic pain syndrome: Secondary | ICD-10-CM | POA: Diagnosis not present

## 2013-07-29 DIAGNOSIS — G35 Multiple sclerosis: Secondary | ICD-10-CM | POA: Diagnosis not present

## 2013-07-31 ENCOUNTER — Telehealth: Payer: Self-pay | Admitting: *Deleted

## 2013-07-31 MED ORDER — FLUCONAZOLE 150 MG PO TABS: 150.0000 mg | ORAL_TABLET | Freq: Every day | ORAL | Status: DC

## 2013-07-31 MED ORDER — LISINOPRIL 20 MG PO TABS: ORAL_TABLET | ORAL | Status: DC

## 2013-07-31 NOTE — Telephone Encounter (Signed)
Hospice called and stated would like script for Diflucan, for yeast rash on vaginal area and bilateral inner thighs. Please advise

## 2013-07-31 NOTE — Telephone Encounter (Signed)
rx sent to [facility]

## 2013-08-09 DIAGNOSIS — N319 Neuromuscular dysfunction of bladder, unspecified: Secondary | ICD-10-CM | POA: Diagnosis not present

## 2013-08-16 ENCOUNTER — Ambulatory Visit: Payer: Self-pay | Admitting: Internal Medicine

## 2013-08-29 ENCOUNTER — Ambulatory Visit: Payer: Self-pay | Admitting: Internal Medicine

## 2013-08-29 DIAGNOSIS — G35 Multiple sclerosis: Secondary | ICD-10-CM | POA: Diagnosis not present

## 2013-08-29 DIAGNOSIS — G894 Chronic pain syndrome: Secondary | ICD-10-CM | POA: Diagnosis not present

## 2013-08-30 DIAGNOSIS — G35 Multiple sclerosis: Secondary | ICD-10-CM | POA: Diagnosis not present

## 2013-08-30 DIAGNOSIS — G894 Chronic pain syndrome: Secondary | ICD-10-CM | POA: Diagnosis not present

## 2013-09-01 DIAGNOSIS — G894 Chronic pain syndrome: Secondary | ICD-10-CM | POA: Diagnosis not present

## 2013-09-01 DIAGNOSIS — G35 Multiple sclerosis: Secondary | ICD-10-CM | POA: Diagnosis not present

## 2013-09-02 DIAGNOSIS — G35 Multiple sclerosis: Secondary | ICD-10-CM | POA: Diagnosis not present

## 2013-09-02 DIAGNOSIS — G894 Chronic pain syndrome: Secondary | ICD-10-CM | POA: Diagnosis not present

## 2013-09-03 DIAGNOSIS — G35 Multiple sclerosis: Secondary | ICD-10-CM | POA: Diagnosis not present

## 2013-09-03 DIAGNOSIS — G894 Chronic pain syndrome: Secondary | ICD-10-CM | POA: Diagnosis not present

## 2013-09-04 DIAGNOSIS — G894 Chronic pain syndrome: Secondary | ICD-10-CM | POA: Diagnosis not present

## 2013-09-04 DIAGNOSIS — G35 Multiple sclerosis: Secondary | ICD-10-CM | POA: Diagnosis not present

## 2013-09-05 DIAGNOSIS — G35 Multiple sclerosis: Secondary | ICD-10-CM | POA: Diagnosis not present

## 2013-09-05 DIAGNOSIS — G894 Chronic pain syndrome: Secondary | ICD-10-CM | POA: Diagnosis not present

## 2013-09-06 ENCOUNTER — Telehealth: Payer: Self-pay | Admitting: Internal Medicine

## 2013-09-06 DIAGNOSIS — N319 Neuromuscular dysfunction of bladder, unspecified: Secondary | ICD-10-CM | POA: Diagnosis not present

## 2013-09-06 NOTE — Telephone Encounter (Signed)
Faxed plan of care to Hospice on 09/04/13 incomplete form due to only 3 out of 5 sheets came through fax refaxed complete set 09/06/13

## 2013-09-06 NOTE — Telephone Encounter (Signed)
States she faxed over paperwork early 4/8 and has not gotten a response.  States they need plan of care signed because the surveyors are there.

## 2013-09-07 DIAGNOSIS — G35 Multiple sclerosis: Secondary | ICD-10-CM | POA: Diagnosis not present

## 2013-09-07 DIAGNOSIS — G894 Chronic pain syndrome: Secondary | ICD-10-CM | POA: Diagnosis not present

## 2013-09-08 DIAGNOSIS — G35 Multiple sclerosis: Secondary | ICD-10-CM | POA: Diagnosis not present

## 2013-09-08 DIAGNOSIS — G894 Chronic pain syndrome: Secondary | ICD-10-CM | POA: Diagnosis not present

## 2013-09-09 DIAGNOSIS — G35 Multiple sclerosis: Secondary | ICD-10-CM | POA: Diagnosis not present

## 2013-09-09 DIAGNOSIS — G894 Chronic pain syndrome: Secondary | ICD-10-CM | POA: Diagnosis not present

## 2013-09-10 ENCOUNTER — Ambulatory Visit: Payer: Self-pay | Admitting: Neurology

## 2013-09-11 DIAGNOSIS — G894 Chronic pain syndrome: Secondary | ICD-10-CM | POA: Diagnosis not present

## 2013-09-11 DIAGNOSIS — G35 Multiple sclerosis: Secondary | ICD-10-CM | POA: Diagnosis not present

## 2013-09-12 DIAGNOSIS — G894 Chronic pain syndrome: Secondary | ICD-10-CM | POA: Diagnosis not present

## 2013-09-12 DIAGNOSIS — G35 Multiple sclerosis: Secondary | ICD-10-CM | POA: Diagnosis not present

## 2013-09-13 DIAGNOSIS — G35 Multiple sclerosis: Secondary | ICD-10-CM | POA: Diagnosis not present

## 2013-09-13 DIAGNOSIS — G894 Chronic pain syndrome: Secondary | ICD-10-CM | POA: Diagnosis not present

## 2013-09-14 ENCOUNTER — Encounter: Payer: Self-pay | Admitting: Neurology

## 2013-09-14 DIAGNOSIS — G35 Multiple sclerosis: Secondary | ICD-10-CM | POA: Diagnosis not present

## 2013-09-14 DIAGNOSIS — G894 Chronic pain syndrome: Secondary | ICD-10-CM | POA: Diagnosis not present

## 2013-09-15 DIAGNOSIS — G35 Multiple sclerosis: Secondary | ICD-10-CM | POA: Diagnosis not present

## 2013-09-15 DIAGNOSIS — G894 Chronic pain syndrome: Secondary | ICD-10-CM | POA: Diagnosis not present

## 2013-09-16 DIAGNOSIS — G35 Multiple sclerosis: Secondary | ICD-10-CM | POA: Diagnosis not present

## 2013-09-16 DIAGNOSIS — G894 Chronic pain syndrome: Secondary | ICD-10-CM | POA: Diagnosis not present

## 2013-09-17 ENCOUNTER — Telehealth: Payer: Self-pay | Admitting: Neurology

## 2013-09-17 NOTE — Telephone Encounter (Signed)
The alarm date was 09/13/2013, apparently the baclofen refill date was set up after the alarm date, which should never happened. The patient has the arms lying set at 2.0 cc, she should be okay to get to our office tomorrow for a refill.

## 2013-09-17 NOTE — Telephone Encounter (Signed)
Pt is calling back stating that she has more questions and would like Dr. Jannifer Franklin to give her a call back please, thanks.

## 2013-09-17 NOTE — Telephone Encounter (Signed)
Pt calling stating that she has an appt tomorrow to get her baclofen pump refilled. Pt states that the reminder alarm has been going off since Saturday and pt wanted to know if it was ok to wait until then of if she needs to come in today. Please advise

## 2013-09-17 NOTE — Telephone Encounter (Signed)
Since talking to Dr. Jannifer Franklin earlier today patient has more questions--please call.

## 2013-09-17 NOTE — Telephone Encounter (Signed)
I called patient. The patient indicates that she does not feel good today, I will see her tomorrow in office we will do the pump refill.

## 2013-09-17 NOTE — Telephone Encounter (Signed)
Patient calling to state that she has appointment to get her baclofen pump refilled tomorrow. Patient states that her alarm has been going off since Saturday, states it is the reminder alarm, and wants to make sure it's okay to wait until tomorrow to get it filled or if she needs to come in today. Please call and advise patient.

## 2013-09-18 ENCOUNTER — Encounter: Payer: Self-pay | Admitting: Neurology

## 2013-09-18 ENCOUNTER — Ambulatory Visit (INDEPENDENT_AMBULATORY_CARE_PROVIDER_SITE_OTHER): Admitting: Neurology

## 2013-09-18 VITALS — BP 125/64 | HR 82

## 2013-09-18 DIAGNOSIS — G35 Multiple sclerosis: Secondary | ICD-10-CM

## 2013-09-18 DIAGNOSIS — G894 Chronic pain syndrome: Secondary | ICD-10-CM | POA: Diagnosis not present

## 2013-09-18 MED ORDER — DULOXETINE HCL 30 MG PO CPEP: 30.0000 mg | ORAL_CAPSULE | Freq: Every day | ORAL | Status: DC

## 2013-09-18 NOTE — Progress Notes (Signed)
Reason for visit: Multiple sclerosis  Angelica Wilcox is an 68 y.o. 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 The patient has had some ongoing progressive weakness of the upper extremities, right greater than left. The patient indicates that her lower extremities feel subjectively more heavy. The patient believes that there has been some increase in muscle tone in the arms and legs. The patient denies any overt spasms. The patient reports some gradual change in memory and concentration. The patient uses marijuana on a daily basis for her discomfort. The patient has recently been put on Cymbalta at 30 mg daily which has helped some of the paresthesias. The patient denies any other new issues other than the urinary catheter, suprapubic catheter appears to be associated with mucus, clogging up. The patient returns for a baclofen pump refill. The patient has noted that the alarm has been going on for 2 or 3 days. The volume alarm was set to go off on 09/13/2013. The patient is having increasing problems supporting the body, will be unable to right herself when she flexes over. The patient may lean to the right or left, and cannot get upright without help.  Past Medical History  Diagnosis Date  . Dysthymic disorder   . Calculus of gallbladder without mention of cholecystitis or obstruction   . Other constipation   . Contracture of hand joint   . Diverticulitis of colon (without mention of hemorrhage)   . Other malaise and fatigue   . Esophageal reflux   . Headache(784.0)   . Other and unspecified hyperlipidemia   . Unspecified urinary incontinence   . Unspecified essential hypertension   . Multiple sclerosis   . Nonspecific elevation of levels of transaminase or lactic acid dehydrogenase (LDH)   . Osteoporosis, unspecified   . Other abnormality of  urination(788.69)   . Rectocele   . Other alteration of consciousness   . Unspecified vitamin D deficiency   . Other B-complex deficiencies   . Dyslipidemia     Past Surgical History  Procedure Laterality Date  . Tubal ligation    . Tonsillectomy and adenoidectomy    . Colon screen  1997  . Carotid doppler  09/2002    Negative  . Dexa  12/2008    OP, slt worse  . Baclofen pump refill  4-24/13    model No. [id number] @ [facility]    Family History  Problem Relation Age of Onset  . Cancer Father   . Hypertension Father   . Alcohol abuse Father   . Heart failure Father     CHF  . Glaucoma Mother   . Pulmonary fibrosis Mother   . Celiac disease      sibling  . Stroke Daughter     Social history:  reports that she quit smoking about 37 years ago. She has never used smokeless tobacco. She reports that she drinks alcohol. She reports that she uses illicit drugs (Marijuana).    Allergies  Allergen Reactions  . Alendronate Sodium     REACTION: GI  . Atorvastatin     REACTION: myalgia  . Bactrim Nausea And Vomiting  . Ciprofloxacin     REACTION: vomiting    Medications:  Current Outpatient Prescriptions on File Prior to Visit  Medication Sig Dispense Refill  . BACLOFEN IT 65.07 mcg model No. [id number] implant date 09/22/11 At [facility]      .  Cholecalciferol (VITAMIN D3) 2000 UNITS TABS Take 1 tablet by mouth daily.       . cyanocobalamin 2000 MCG tablet Take 2,000 mcg by mouth daily.      . fluconazole (DIFLUCAN) 150 MG tablet Take 1 tablet (150 mg total) by mouth daily.  2 tablet  0  . gabapentin (NEURONTIN) 300 MG capsule Take 2 capsules (600 mg total) by mouth 3 (three) times daily. 2 tablets every morning, 2 tablets at 3pm, and 2 tablets at night  180 capsule  11  . HYDROcodone-acetaminophen (NORCO/VICODIN) 5-325 MG per tablet Take 1 tablet by mouth every 6 (six) hours as needed.  30 tablet  0  . Infant Care Products (DERMACLOUD) CREA Apply to affected areas 2-3 times a day.  430 g  2   . lisinopril (PRINIVIL,ZESTRIL) 20 MG tablet TAKE ONE TABLET BY MOUTH ONE TIME DAILY  30 tablet  5  . LORazepam (ATIVAN) 0.5 MG tablet Take 1 tablet (0.5 mg total) by mouth daily as needed.  30 tablet  3  . nystatin (MYCOSTATIN) powder Apply topically 4 (four) times daily. To inguinal area until rash resolves  60 g  3  . ondansetron (ZOFRAN) 4 MG tablet Take 1 tablet (4 mg total) by mouth every 8 (eight) hours as needed for nausea.  20 tablet  0   No current facility-administered medications on file prior to visit.    ROS:  Out of a complete 14 system review of symptoms, the patient complains only of the following symptoms, and all other reviewed systems are negative.  Weakness, paresthesias Memory disorder Malaise, fatigue  Blood pressure 125/64, pulse 82, weight 0 lb (0 kg).  Physical Exam  General: The patient is alert and cooperative at the time of the examination.  Skin: No significant peripheral edema is noted.   Neurologic Exam  Mental status: The patient is oriented x 3. Mini-Mental status examination done today shows a total score of 30/30.  Cranial nerves: Facial symmetry is present. Speech is normal, no aphasia or dysarthria is noted. Extraocular movements are full. Visual fields are full.  Motor: The patient has increased motor tone on the right greater left upper extremity. The patient has poor grip of the right hand, 2/5 strength of the right hand with grip and extension of the fingers. The patient has a partial flexion contracture of the right elbow. 4 minus/5 strength proximally in the right arm. The patient has 4+/5 strength in the left arm. Patient has essentially no use of the legs.  Sensory examination: Soft touch sensation is symmetric on the face, arms, legs.  Coordination: The patient has good finger-nose-finger with the left arm, some ataxia with the right arm. The patient is unable to perform heel shin with the legs.  Gait and station: The patient is  wheelchair-bound, cannot be ambulated.  Reflexes: Deep tendon reflexes are symmetric, depressed.   Assessment/Plan:  One. Multiple sclerosis  2. 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 Annual blood work will be done at that time. The patient had her baclofen pump placed at this time. The patient is gradually worsening with her multiple sclerosis. The patient smokes marijuana daily, and this could be an issue with her memory. The patient will followup for her baclofen pump refill before 01/11/2014.  Jill Alexanders MD 09/18/2013 1:13 PM  [facility] [address] [place], [place] [address]  Phone [phone] Fax [phone]

## 2013-09-18 NOTE — Procedures (Signed)
     History:  Angelica Wilcox is a 68 year old patient with a history of multiple sclerosis with a spastic quadriparesis. The patient has a baclofen pump in place, comes in for a baclofen pump refill.  Baclofen pump refill note  The baclofen pump site was cleaned with Betadine solution. A 21-gauge needle was inserted into the pump port site. Approximately 1.5 cc of residual baclofen was removed. 20 cc of replacement baclofen was placed into the pump at 500 mcg/cc concentration.  The pump was reprogrammed for the following settings: 79.96 mcg/day from 75.05 mcg/day.  The alarm volume is set at 1.5 cc. The next alarm date is 01/11/2014.  The patient tolerated the procedure well. There were no complications of the above procedure.

## 2013-09-19 ENCOUNTER — Ambulatory Visit (INDEPENDENT_AMBULATORY_CARE_PROVIDER_SITE_OTHER): Admitting: Internal Medicine

## 2013-09-19 ENCOUNTER — Encounter: Payer: Self-pay | Admitting: Internal Medicine

## 2013-09-19 VITALS — BP 114/68 | HR 77 | Temp 98.1°F | Resp 16

## 2013-09-19 DIAGNOSIS — R5383 Other fatigue: Secondary | ICD-10-CM | POA: Diagnosis not present

## 2013-09-19 DIAGNOSIS — E559 Vitamin D deficiency, unspecified: Secondary | ICD-10-CM | POA: Diagnosis not present

## 2013-09-19 DIAGNOSIS — I10 Essential (primary) hypertension: Secondary | ICD-10-CM

## 2013-09-19 DIAGNOSIS — R5381 Other malaise: Secondary | ICD-10-CM | POA: Diagnosis not present

## 2013-09-19 DIAGNOSIS — G894 Chronic pain syndrome: Secondary | ICD-10-CM | POA: Diagnosis not present

## 2013-09-19 DIAGNOSIS — R8290 Unspecified abnormal findings in urine: Secondary | ICD-10-CM

## 2013-09-19 DIAGNOSIS — G35 Multiple sclerosis: Secondary | ICD-10-CM | POA: Diagnosis not present

## 2013-09-19 DIAGNOSIS — Z79899 Other long term (current) drug therapy: Secondary | ICD-10-CM

## 2013-09-19 DIAGNOSIS — R110 Nausea: Secondary | ICD-10-CM

## 2013-09-19 DIAGNOSIS — R82998 Other abnormal findings in urine: Secondary | ICD-10-CM | POA: Diagnosis not present

## 2013-09-19 DIAGNOSIS — K5900 Constipation, unspecified: Secondary | ICD-10-CM

## 2013-09-19 LAB — CBC WITH DIFFERENTIAL/PLATELET
Basophils Absolute: 0.1 10*3/uL (ref 0.0–0.1)
Basophils Relative: 0.8 % (ref 0.0–3.0)
EOS PCT: 1.6 % (ref 0.0–5.0)
Eosinophils Absolute: 0.1 10*3/uL (ref 0.0–0.7)
HCT: 40.4 % (ref 36.0–46.0)
Hemoglobin: 13.6 g/dL (ref 12.0–15.0)
Lymphocytes Relative: 30.4 % (ref 12.0–46.0)
Lymphs Abs: 2.7 10*3/uL (ref 0.7–4.0)
MCHC: 33.7 g/dL (ref 30.0–36.0)
MCV: 87.2 fl (ref 78.0–100.0)
MONO ABS: 0.6 10*3/uL (ref 0.1–1.0)
Monocytes Relative: 6.5 % (ref 3.0–12.0)
Neutro Abs: 5.4 10*3/uL (ref 1.4–7.7)
Neutrophils Relative %: 60.7 % (ref 43.0–77.0)
PLATELETS: 282 10*3/uL (ref 150.0–400.0)
RBC: 4.63 Mil/uL (ref 3.87–5.11)
RDW: 14 % (ref 11.5–14.6)
WBC: 8.9 10*3/uL (ref 4.5–10.5)

## 2013-09-19 LAB — POCT URINALYSIS DIPSTICK
Bilirubin, UA: NEGATIVE
GLUCOSE UA: NEGATIVE
Ketones, UA: NEGATIVE
Nitrite, UA: NEGATIVE
Protein, UA: NEGATIVE
SPEC GRAV UA: 1.015
Urobilinogen, UA: 0.2
pH, UA: 7

## 2013-09-19 LAB — COMPREHENSIVE METABOLIC PANEL
ALBUMIN: 3.7 g/dL (ref 3.5–5.2)
ALK PHOS: 55 U/L (ref 39–117)
ALT: 22 U/L (ref 0–35)
AST: 23 U/L (ref 0–37)
BUN: 10 mg/dL (ref 6–23)
CO2: 26 mEq/L (ref 19–32)
Calcium: 9.2 mg/dL (ref 8.4–10.5)
Chloride: 101 mEq/L (ref 96–112)
Creatinine, Ser: 0.5 mg/dL (ref 0.4–1.2)
GFR: 137.05 mL/min (ref >60.00)
Glucose, Bld: 78 mg/dL (ref 70–99)
POTASSIUM: 4.1 meq/L (ref 3.5–5.1)
Sodium: 136 mEq/L (ref 135–145)
Total Bilirubin: 0.5 mg/dL (ref 0.3–1.2)
Total Protein: 6.3 g/dL (ref 6.0–8.3)

## 2013-09-19 LAB — TSH: TSH: 1.14 u[IU]/mL (ref 0.35–5.50)

## 2013-09-19 LAB — MAGNESIUM: MAGNESIUM: 2 mg/dL (ref 1.5–2.5)

## 2013-09-19 NOTE — Progress Notes (Signed)
Patient ID: Angelica Wilcox, female   DOB: 30-Aug-1945, 68 y.o.   MRN: [id number]  Patient Active Problem List   Diagnosis Date Noted  . Nausea alone 09/22/2013  . Unspecified constipation 09/22/2013  . Encounter for long-term (current) use of other medications 09/22/2013  . Chronic pain 05/30/2013  . Dermatitis 05/30/2013  . Candidiasis of skin 05/13/2013  . Counseling regarding end of life decision making 05/11/2013  . Do not resuscitate 05/11/2013  . Pressure sore 09/15/2012  . Cognitive complaints with normal neuropsychological exam 08/26/2012  . Protein-calorie malnutrition, mild 11/14/2011  . Hypertension 10/02/2010  . 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 Unspecified vitamin D deficiency 07/05/2008  . VITAMIN B12 DEFICIENCY 06/04/2008  . ANXIETY DEPRESSION 05/29/2008  . DIVERTICULITIS, COLON 08/03/2007  . CHOLELITHIASIS, ASYMPTOMATIC 08/03/2007  . HYPERLIPIDEMIA 11/04/2006  . Multiple sclerosis, primary progressive 11/04/2006  . GERD 11/04/2006  . PROLAPSE, VAGINAL WALL, RECTOCELE 11/04/2006  . OSTEOPOROSIS 11/04/2006  . Neurogenic bladder 11/04/2006    Subjective:  CC:   Chief Complaint  Patient presents with  . Follow-up    cathater sediment    HPI:   Angelica Wilcox is a 68 y.o. female who presents for  Follow up on multiple issues.    She saw Dr Jannifer Franklin yesterday for persistent baclofen pump alarming, which was replaced since it was running ou tof medication. The pump is located subcutaneously on the left side of her abdomen.   MMSE was normal per [facility].. But she still worries about being forgetfull during different times in the day  suprapubic Catheter (urinary) is clogging.  Has a lot of sediment  . Not flushing it daily. The urine     Hospice is trying to help her obtain several adaptive devices including a chair harness because she has been losing the ability to maintain her upright position without help due to progressive torso muscle weakness.     She is also developing inversion of her right foot and ankle and is requiring a boot to prevent permanent contracture.  She has an appt with the Hospice Physicial Therapist tomorrow.    Needs labs today  Caregiver kathy.  Reports that she had nausea lasting 3 hours on Monday before and during a BM and has noted a lot of urine sediment in her catheter.  Sh does not flush it regularly.  She had throbbing pain in her forehead over her  left eye for 2 days ,  Felt like an early migraine.  The pain resolved spontaneously.   Bowels improving predictably  with citrucel at night    Past Medical History  Diagnosis Date  . Dysthymic disorder   . Calculus of gallbladder without mention of cholecystitis or obstruction   . Other constipation   . Contracture of hand joint   . Diverticulitis of colon (without mention of hemorrhage)   . Other malaise and fatigue   . Esophageal reflux   . Headache(784.0)   . Other and unspecified hyperlipidemia   . Unspecified urinary incontinence   . Unspecified essential hypertension   . Multiple sclerosis   . Nonspecific elevation of levels of transaminase or lactic acid dehydrogenase (LDH)   . Osteoporosis, unspecified   . Other abnormality of urination(788.69)   . Rectocele   . Other alteration of consciousness   . Unspecified vitamin D deficiency   . Other B-complex deficiencies   . Dyslipidemia     Past Surgical History  Procedure Laterality Date  . Tubal ligation    .  Tonsillectomy and adenoidectomy    . Colon screen  1997  . Carotid doppler  09/2002    Negative  . Dexa  12/2008    OP, slt worse  . Baclofen pump refill  4-24/13    model No. [id number] @ [facility]       The following portions of the patient's history were reviewed and updated as appropriate: Allergies, current medications, and problem list.    Review of Systems:   Patient denies headache, fevers, malaise, unintentional weight loss, skin rash, eye pain, sinus congestion and  sinus pain, sore throat, dysphagia,  hemoptysis , cough, dyspnea, wheezing, chest pain, palpitations, orthopnea, edema, abdominal pain, nausea, melena, diarrhea, constipation, flank pain, dysuria, hematuria, urinary  Frequency, nocturia, numbness, tingling, seizures,  Focal weakness, Loss of consciousness,  Tremor, insomnia, depression, anxiety, and suicidal ideation.     History   Social History  . Marital Status: Widowed    Spouse Name: N/A    Number of Children: N/A  . Years of Education: N/A   Occupational History  . Disabled    Social History Main Topics  . Smoking status: Former Smoker    Quit date: 05/31/1976  . Smokeless tobacco: Never Used  . Alcohol Use: Yes     Comment: rarely drinks alcohol, The patient uses daily marijuana.  . Drug Use: Yes    Special: Marijuana  . Sexual Activity: Not on file   Other Topics Concern  . Not on file   Social History Narrative   Disabled from MS-wheelchair bound    Objective:  Filed Vitals:   09/19/13 1408  BP: 114/68  Pulse: 77  Temp: 98.1 F (36.7 C)  Resp: 16     General appearance: alert, cooperative and appears stated age Ears: normal TM's and external ear canals both ears Throat: lips, mucosa, and tongue normal; teeth and gums normal Neck: no adenopathy, no carotid bruit, supple, symmetrical, trachea midline and thyroid not enlarged, symmetric, no tenderness/mass/nodules Back: symmetric, no curvature. ROM normal. No CVA tenderness. Lungs: clear to auscultation bilaterally Heart: regular rate and rhythm, S1, S2 normal, no murmur, click, rub or gallop Abdomen: soft, non-tender; bowel sounds normal; no masses,  no organomegaly Pulses: 2+ and symmetric Skin: Skin color, texture, turgor normal. No rashes or lesions Lymph nodes: Cervical, supraclavicular, and axillary nodes normal.  Assessment and Plan:  Nausea alone With increased foley sediment,  Suspect UTI due to indwelling Foley. UA was abnormal but urine  culture was inconclusive . Other labs normal.. Since symptoms have resolved, advised caregiver to flush foley daily   Multiple sclerosis, primary progressive She has developed significant weakness of her torso muscles and her ankle muscles .  She is  having difficulty maintaining upright position without assistance, andis developing inversion of her right ankle.  She would benefit from several adaptive devices , which I will order based on PT evaluation coming up.   Unspecified constipation Improved  With daily use of citrucel  Hypertension Well controlled on current regimen. Renal function stable, no changes today.  Lab Results  Component Value Date   CREATININE 0.5 09/19/2013   Lab Results  Component Value Date   NA 136 09/19/2013   K 4.1 09/19/2013   CL 101 09/19/2013   CO2 26 09/19/2013      Encounter for long-term (current) use of other medications  vitamin D, thyroid , cholesterol, liver and kidney function are normal.   A total of 40 minutes was spent with patient more than half  of which was spent in counseling, reviewing records from other prviders and coordination of care.   Updated Medication List Outpatient Encounter Prescriptions as of 09/19/2013  Medication Sig  . BACLOFEN IT 65.07 mcg model No. [id number] implant date 09/22/11 At [facility]  . Cholecalciferol (VITAMIN D3) 2000 UNITS TABS Take 1 tablet by mouth daily.   . cyanocobalamin 2000 MCG tablet Take 2,000 mcg by mouth daily.  . DULoxetine (CYMBALTA) 30 MG capsule Take 1 capsule (30 mg total) by mouth daily.  . fluconazole (DIFLUCAN) 150 MG tablet Take 1 tablet (150 mg total) by mouth daily.  Marland Kitchen gabapentin (NEURONTIN) 300 MG capsule Take 2 capsules (600 mg total) by mouth 3 (three) times daily. 2 tablets every morning, 2 tablets at 3pm, and 2 tablets at night  . HYDROcodone-acetaminophen (NORCO/VICODIN) 5-325 MG per tablet Take 1 tablet by mouth every 6 (six) hours as needed.  . [place] (DERMACLOUD) CREA Apply  to affected areas 2-3 times a day.  . lisinopril (PRINIVIL,ZESTRIL) 20 MG tablet TAKE ONE TABLET BY MOUTH ONE TIME DAILY  . LORazepam (ATIVAN) 0.5 MG tablet Take 1 tablet (0.5 mg total) by mouth daily as needed.  . nystatin (MYCOSTATIN) powder Apply topically 4 (four) times daily. To inguinal area until rash resolves  . ondansetron (ZOFRAN) 4 MG tablet Take 1 tablet (4 mg total) by mouth every 8 (eight) hours as needed for nausea.  Marland Kitchen oxybutynin (DITROPAN) 5 MG tablet Take 5 mg by mouth 2 (two) times daily.  . traMADol (ULTRAM) 50 MG tablet Take 50 mg by mouth every 6 (six) hours as needed.     Orders Placed This Encounter  Procedures  . Urine culture  . CBC with Differential  . Comprehensive metabolic panel  . TSH  . Vit D  25 hydroxy (rtn osteoporosis monitoring)  . Magnesium  . POCT Urinalysis Dipstick    Return in about 3 months (around 12/19/2013).

## 2013-09-19 NOTE — Patient Instructions (Signed)
You do not need to return in order for me to write the DME orders for you adaptive devices.  Just have the PT send me a memo of what you need.  You should flush the catheter once daily with sterile water or saline to prevent clogging with sediment  If your urine culture is positive for infection we will notify you and start an antibiotic

## 2013-09-19 NOTE — Progress Notes (Signed)
Pre-visit discussion using our clinic review tool. No additional management support is needed unless otherwise documented below in the visit note.  

## 2013-09-20 DIAGNOSIS — Z79899 Other long term (current) drug therapy: Secondary | ICD-10-CM | POA: Diagnosis not present

## 2013-09-20 DIAGNOSIS — G894 Chronic pain syndrome: Secondary | ICD-10-CM | POA: Diagnosis not present

## 2013-09-20 DIAGNOSIS — G35 Multiple sclerosis: Secondary | ICD-10-CM | POA: Diagnosis not present

## 2013-09-20 LAB — VITAMIN D 25 HYDROXY (VIT D DEFICIENCY, FRACTURES): Vit D, 25-Hydroxy: 64 ng/mL (ref 30–89)

## 2013-09-21 ENCOUNTER — Encounter: Payer: Self-pay | Admitting: Internal Medicine

## 2013-09-21 ENCOUNTER — Telehealth: Payer: Self-pay | Admitting: Internal Medicine

## 2013-09-21 DIAGNOSIS — G894 Chronic pain syndrome: Secondary | ICD-10-CM | POA: Diagnosis not present

## 2013-09-21 DIAGNOSIS — G35 Multiple sclerosis: Secondary | ICD-10-CM | POA: Diagnosis not present

## 2013-09-21 NOTE — Telephone Encounter (Signed)
Verbal Josem Kaufmann for OT GIVEN

## 2013-09-21 NOTE — Telephone Encounter (Signed)
Hospice called for verbal for patient to have OT  For positioning of arm and to help ADLs verbal given. Per your instruction for OT and PT for patients.

## 2013-09-22 DIAGNOSIS — R110 Nausea: Secondary | ICD-10-CM | POA: Insufficient documentation

## 2013-09-22 DIAGNOSIS — Z79899 Other long term (current) drug therapy: Secondary | ICD-10-CM | POA: Insufficient documentation

## 2013-09-22 DIAGNOSIS — K5900 Constipation, unspecified: Secondary | ICD-10-CM | POA: Insufficient documentation

## 2013-09-22 LAB — URINE CULTURE

## 2013-09-22 NOTE — Assessment & Plan Note (Signed)
She has developed significant weakness of her torso muscles and her ankle muscles .  She is  having difficulty maintaining upright position without assistance, andis developing inversion of her right ankle.  She would benefit from several adaptive devices , which I will order based on PT evaluation coming up.

## 2013-09-22 NOTE — Assessment & Plan Note (Addendum)
With increased foley sediment,  Suspect UTI due to indwelling Foley. UA was abnormal but urine culture was inconclusive . Other labs normal.. Since symptoms have resolved, advised caregiver to flush foley daily

## 2013-09-22 NOTE — Assessment & Plan Note (Signed)
vitamin D, thyroid , cholesterol, liver and kidney function are normal.

## 2013-09-22 NOTE — Assessment & Plan Note (Signed)
Improved  With daily use of citrucel

## 2013-09-22 NOTE — Assessment & Plan Note (Signed)
Well controlled on current regimen. Renal function stable, no changes today.  Lab Results  Component Value Date   CREATININE 0.5 09/19/2013   Lab Results  Component Value Date   NA 136 09/19/2013   K 4.1 09/19/2013   CL 101 09/19/2013   CO2 26 09/19/2013

## 2013-09-23 DIAGNOSIS — G35 Multiple sclerosis: Secondary | ICD-10-CM | POA: Diagnosis not present

## 2013-09-23 DIAGNOSIS — G894 Chronic pain syndrome: Secondary | ICD-10-CM | POA: Diagnosis not present

## 2013-09-25 DIAGNOSIS — G894 Chronic pain syndrome: Secondary | ICD-10-CM | POA: Diagnosis not present

## 2013-09-25 DIAGNOSIS — G35 Multiple sclerosis: Secondary | ICD-10-CM | POA: Diagnosis not present

## 2013-09-26 ENCOUNTER — Other Ambulatory Visit: Payer: Self-pay | Admitting: Internal Medicine

## 2013-09-26 ENCOUNTER — Telehealth: Payer: Self-pay | Admitting: Internal Medicine

## 2013-09-26 DIAGNOSIS — G894 Chronic pain syndrome: Secondary | ICD-10-CM | POA: Diagnosis not present

## 2013-09-26 DIAGNOSIS — G35 Multiple sclerosis: Secondary | ICD-10-CM | POA: Diagnosis not present

## 2013-09-26 NOTE — Telephone Encounter (Signed)
Orders for DMES requested by patient and her PT have been printed need to be sent to [facility] and [place] ,  Fax [phone]

## 2013-09-26 NOTE — Telephone Encounter (Signed)
Reprinted orders waiting for signature.

## 2013-09-27 NOTE — Telephone Encounter (Signed)
Faxed to Hospice

## 2013-09-28 ENCOUNTER — Ambulatory Visit: Payer: Self-pay | Admitting: Internal Medicine

## 2013-09-28 DIAGNOSIS — G894 Chronic pain syndrome: Secondary | ICD-10-CM | POA: Diagnosis not present

## 2013-09-28 DIAGNOSIS — G35 Multiple sclerosis: Secondary | ICD-10-CM | POA: Diagnosis not present

## 2013-10-04 ENCOUNTER — Ambulatory Visit: Payer: Medicare Other | Admitting: Internal Medicine

## 2013-10-18 ENCOUNTER — Ambulatory Visit: Payer: Self-pay | Admitting: Internal Medicine

## 2013-10-26 ENCOUNTER — Encounter: Payer: Self-pay | Admitting: Internal Medicine

## 2013-10-29 ENCOUNTER — Ambulatory Visit: Payer: Self-pay | Admitting: Internal Medicine

## 2013-10-29 DIAGNOSIS — G35 Multiple sclerosis: Secondary | ICD-10-CM | POA: Diagnosis not present

## 2013-10-29 DIAGNOSIS — G894 Chronic pain syndrome: Secondary | ICD-10-CM | POA: Diagnosis not present

## 2013-10-30 ENCOUNTER — Telehealth: Payer: Self-pay | Admitting: Internal Medicine

## 2013-10-30 NOTE — Telephone Encounter (Signed)
Letter on printer

## 2013-10-30 NOTE — Telephone Encounter (Signed)
Patient called in states she needs a letter to get out of Angelica Wilcox Duty due to her disability she needs this by November 06, 2013 please advise.

## 2013-10-31 NOTE — Telephone Encounter (Signed)
Notified patient placed letter up front for pick up.

## 2013-11-08 ENCOUNTER — Telehealth: Payer: Self-pay | Admitting: Neurology

## 2013-11-08 NOTE — Telephone Encounter (Signed)
Needs to reschedule her appt for baclofen refill appt

## 2013-11-20 ENCOUNTER — Telehealth: Payer: Self-pay | Admitting: Neurology

## 2013-11-20 NOTE — Telephone Encounter (Signed)
Pt called states she needs an apt to come in to see Dr. Jannifer Franklin to re calibrate her pump to a higher dosage. Please call pt back concerning this matter. Thanks

## 2013-11-21 NOTE — Telephone Encounter (Signed)
Patient feels that her baclofen pump needs re-calibration. Next refill is not until August. Please advise.

## 2013-11-21 NOTE — Telephone Encounter (Signed)
I called the patient. It sounds like she needs a reprogramming of the baclofen pump. This can be done any day, just had the patient come at the end of the day.

## 2013-11-22 NOTE — Telephone Encounter (Signed)
Angelica Wilcox can't make it today at either time provided. She should be able to come by tomorrow 6/26 @ 4.

## 2013-11-23 ENCOUNTER — Other Ambulatory Visit (INDEPENDENT_AMBULATORY_CARE_PROVIDER_SITE_OTHER): Admitting: Neurology

## 2013-11-23 ENCOUNTER — Ambulatory Visit (INDEPENDENT_AMBULATORY_CARE_PROVIDER_SITE_OTHER): Payer: Medicare Other | Admitting: Neurology

## 2013-11-23 DIAGNOSIS — G35 Multiple sclerosis: Secondary | ICD-10-CM | POA: Diagnosis not present

## 2013-11-23 DIAGNOSIS — Z0289 Encounter for other administrative examinations: Secondary | ICD-10-CM

## 2013-11-23 NOTE — Procedures (Signed)
     History:  Angelica Wilcox is a 68 year old patient with a history of multiple sclerosis with a spastic quadriparesis. She is having increased muscle tone in the legs, comes in for a baclofen pump adjustment.  Baclofen pump refill note  The baclofen pump was not refilled, but was reprogrammed.  The pump was reprogrammed for the following settings: The rate was increased from 79.96 mcg daily to a rate of 85.85 mcg daily.  The alarm volume is set at 1.5 cc. The next alarm date is 01/08/2014.  The patient tolerated the procedure well. There were no complications of the above procedure.

## 2013-11-27 ENCOUNTER — Telehealth: Payer: Self-pay | Admitting: Neurology

## 2013-11-27 NOTE — Telephone Encounter (Signed)
Please advise previous note.

## 2013-11-27 NOTE — Telephone Encounter (Signed)
Angelica Wilcox with [facility] [phone] requesting a call back regarding the dosage of the patient's baclofen pump. Please call to advise.

## 2013-11-27 NOTE — Telephone Encounter (Signed)
I called the hospice nurse, gave information concerning the baclofen pump. If they need more information, they are to call his back.

## 2013-11-28 DIAGNOSIS — G35 Multiple sclerosis: Secondary | ICD-10-CM | POA: Diagnosis not present

## 2013-11-28 DIAGNOSIS — G894 Chronic pain syndrome: Secondary | ICD-10-CM | POA: Diagnosis not present

## 2013-12-19 ENCOUNTER — Ambulatory Visit: Payer: Self-pay | Admitting: Internal Medicine

## 2013-12-20 ENCOUNTER — Ambulatory Visit (INDEPENDENT_AMBULATORY_CARE_PROVIDER_SITE_OTHER): Payer: Medicare Other | Admitting: Internal Medicine

## 2013-12-20 ENCOUNTER — Encounter: Payer: Self-pay | Admitting: Internal Medicine

## 2013-12-20 VITALS — BP 128/76 | HR 100 | Temp 98.1°F | Resp 14

## 2013-12-20 DIAGNOSIS — G35 Multiple sclerosis: Secondary | ICD-10-CM

## 2013-12-20 DIAGNOSIS — Z9359 Other cystostomy status: Secondary | ICD-10-CM | POA: Diagnosis not present

## 2013-12-20 DIAGNOSIS — G8929 Other chronic pain: Secondary | ICD-10-CM | POA: Diagnosis not present

## 2013-12-20 NOTE — Progress Notes (Signed)
Patient ID: Angelica Wilcox, female   DOB: 1945/06/18, 68 y.o.   MRN: [id number]  Patient Active Problem List   Diagnosis Date Noted  . Suprapubic catheter 12/20/2013  . Nausea alone 09/22/2013  . Unspecified constipation 09/22/2013  . Encounter for long-term (current) use of other medications 09/22/2013  . Chronic pain 05/30/2013  . Dermatitis 05/30/2013  . Candidiasis of skin 05/13/2013  . Counseling regarding end of life decision making 05/11/2013  . Do not resuscitate 05/11/2013  . Pressure sore 09/15/2012  . Cognitive complaints with normal neuropsychological exam 08/26/2012  . Protein-calorie malnutrition, mild 11/14/2011  . Hypertension 10/02/2010  . 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 Unspecified vitamin D deficiency 07/05/2008  . VITAMIN B12 DEFICIENCY 06/04/2008  . ANXIETY DEPRESSION 05/29/2008  . DIVERTICULITIS, COLON 08/03/2007  . CHOLELITHIASIS, ASYMPTOMATIC 08/03/2007  . HYPERLIPIDEMIA 11/04/2006  . Multiple sclerosis, primary progressive 11/04/2006  . GERD 11/04/2006  . PROLAPSE, VAGINAL WALL, RECTOCELE 11/04/2006  . OSTEOPOROSIS 11/04/2006  . Neurogenic bladder 11/04/2006    Subjective:  CC:   Chief Complaint  Patient presents with  . Follow-up    3 month    HPI:   Angelica Wilcox is a 68 y.o. female who presents for  3 month follow up on chronic issues including MS with chronic indwelling foley catheter,  She is accompanied by her caregiver,.  Pain managed by Dr Ermalinda Memos at [place].  Hospice providign services and assistance.  She has no new issues other than occasional leaking of urine a round her Foley  catheter which she feels may be  Causing by her chronic constipation putting stress on the bladder . She does not want to try any medications for chronic constipation.  Her arms are becoming weaker and more painful to manipulate. . Dr Phifer prescribed oxycodone and has encouraged her to use it .Hydrocodone stopped working around  December   Discussed natural course of MS due to patient noticing more dyspnea and occasional dysphagia for solid foods. She has  DNI/DNR order in place.    Past Medical History  Diagnosis Date  . Dysthymic disorder   . Calculus of gallbladder without mention of cholecystitis or obstruction   . Other constipation   . Contracture of hand joint   . Diverticulitis of colon (without mention of hemorrhage)   . Other malaise and fatigue   . Esophageal reflux   . Headache(784.0)   . Other and unspecified hyperlipidemia   . Unspecified urinary incontinence   . Unspecified essential hypertension   . Multiple sclerosis   . Nonspecific elevation of levels of transaminase or lactic acid dehydrogenase (LDH)   . Osteoporosis, unspecified   . Other abnormality of urination(788.69)   . Rectocele   . Other alteration of consciousness   . Unspecified vitamin D deficiency   . Other B-complex deficiencies   . Dyslipidemia     Past Surgical History  Procedure Laterality Date  . Tubal ligation    . Tonsillectomy and adenoidectomy    . Colon screen  1997  . Carotid doppler  09/2002    Negative  . Dexa  12/2008    OP, slt worse  . Baclofen pump refill  4-24/13    model No. [id number] @ [facility]       The following portions of the patient's history were reviewed and updated as appropriate: Allergies, current medications, and problem list.    Review of Systems:   Patient denies headache, fevers, malaise, unintentional weight loss, skin rash,  eye pain, sinus congestion and sinus pain, sore throat, dysphagia,  hemoptysis , cough, dyspnea, wheezing, chest pain, palpitations, orthopnea, edema, abdominal pain, nausea, melena, diarrhea, constipation, flank pain, dysuria, hematuria, urinary  Frequency, nocturia, numbness, tingling, seizures,  Focal weakness, Loss of consciousness,  Tremor, insomnia, depression, anxiety, and suicidal ideation.     History   Social History  . Marital Status: Widowed     Spouse Name: N/A    Number of Children: N/A  . Years of Education: N/A   Occupational History  . Disabled    Social History Main Topics  . Smoking status: Former Smoker    Quit date: 05/31/1976  . Smokeless tobacco: Never Used  . Alcohol Use: Yes     Comment: rarely drinks alcohol, The patient uses daily marijuana.  . Drug Use: Yes    Special: Marijuana  . Sexual Activity: Not on file   Other Topics Concern  . Not on file   Social History Narrative   Disabled from MS-wheelchair bound    Objective:  Filed Vitals:   12/20/13 1027  BP: 128/76  Pulse: 100  Temp: 98.1 F (36.7 C)  Resp: 14     General appearance: alert, cooperative and appears stated age Ears: normal TM's and external ear canals both ears Throat: lips, mucosa, and tongue normal; teeth and gums normal Neck: no adenopathy, no carotid bruit, supple, symmetrical, trachea midline and thyroid not enlarged, symmetric, no tenderness/mass/nodules Back: symmetric, no curvature. ROM normal. No CVA tenderness. Lungs: clear to auscultation bilaterally Heart: regular rate and rhythm, S1, S2 normal, no murmur, click, rub or gallop Abdomen: soft, non-tender; bowel sounds normal; no masses,  no organomegaly Pulses: 2+ and symmetric Skin: Skin color, texture, turgor normal. No rashes or lesions Lymph nodes: Cervical, supraclavicular, and axillary nodes normal.  Assessment and Plan:  Suprapubic catheter S/p suprapubic bladder May 2014 secondary to neurogenic bladder.  Hospice is changing it once monthly for her.     Multiple sclerosis, primary progressive Progressive , with deterioration in bulbar muscle coordination noted by patient infrequently today  Long discussion  about prognosis today   Chronic pain Managed by Dr Ermalinda Memos with oxycodone.    Updated Medication List Outpatient Encounter Prescriptions as of 12/20/2013  Medication Sig  . BACLOFEN IT 65.07 mcg model No. [id number] implant date 09/22/11 At [facility]  .  Cholecalciferol (VITAMIN D3) 2000 UNITS TABS Take 1 tablet by mouth daily.   . cyanocobalamin 2000 MCG tablet Take 2,000 mcg by mouth daily.  . DULoxetine (CYMBALTA) 30 MG capsule Take 60 mg by mouth daily.  Marland Kitchen gabapentin (NEURONTIN) 300 MG capsule Take 2 capsules (600 mg total) by mouth 3 (three) times daily. 2 tablets every morning, 2 tablets at 3pm, and 2 tablets at night  . [employer] (DERMACLOUD) CREA Apply to affected areas 2-3 times a day.  . lisinopril (PRINIVIL,ZESTRIL) 20 MG tablet TAKE ONE TABLET BY MOUTH ONE TIME DAILY  . LORazepam (ATIVAN) 0.5 MG tablet Take 1 tablet (0.5 mg total) by mouth daily as needed.  Marland Kitchen oxyCODONE-acetaminophen (PERCOCET/ROXICET) 5-325 MG per tablet Take 1-2 tablets by mouth every 6 (six) hours as needed for severe pain.  . traMADol (ULTRAM) 50 MG tablet Take 50 mg by mouth every 6 (six) hours as needed.  . [DISCONTINUED] DULoxetine (CYMBALTA) 30 MG capsule Take 1 capsule (30 mg total) by mouth daily.  . fluconazole (DIFLUCAN) 150 MG tablet Take 1 tablet (150 mg total) by mouth daily.  Marland Kitchen nystatin (MYCOSTATIN) powder  Apply topically 4 (four) times daily. To inguinal area until rash resolves  . ondansetron (ZOFRAN) 4 MG tablet Take 1 tablet (4 mg total) by mouth every 8 (eight) hours as needed for nausea.  Marland Kitchen oxybutynin (DITROPAN) 5 MG tablet Take 5 mg by mouth 2 (two) times daily.  . [DISCONTINUED] HYDROcodone-acetaminophen (NORCO/VICODIN) 5-325 MG per tablet Take 1 tablet by mouth every 6 (six) hours as needed.     No orders of the defined types were placed in this encounter.    Return in about 6 months (around 06/22/2014).

## 2013-12-20 NOTE — Progress Notes (Signed)
Pre-visit discussion using our clinic review tool. No additional management support is needed unless otherwise documented below in the visit note.  

## 2013-12-22 NOTE — Assessment & Plan Note (Signed)
Progressive , with deterioration in bulbar muscle coordination noted by patient infrequently today  Long discussion  about prognosis today

## 2013-12-22 NOTE — Assessment & Plan Note (Signed)
S/p suprapubic bladder May 2014 secondary to neurogenic bladder.  Hospice is changing it once monthly for her.

## 2013-12-22 NOTE — Assessment & Plan Note (Signed)
Managed by Dr Phifer with oxycodone.

## 2013-12-29 ENCOUNTER — Ambulatory Visit: Payer: Self-pay | Admitting: Internal Medicine

## 2013-12-29 DIAGNOSIS — Z435 Encounter for attention to cystostomy: Secondary | ICD-10-CM | POA: Diagnosis not present

## 2013-12-29 DIAGNOSIS — G35 Multiple sclerosis: Secondary | ICD-10-CM | POA: Diagnosis not present

## 2013-12-29 DIAGNOSIS — G894 Chronic pain syndrome: Secondary | ICD-10-CM | POA: Diagnosis not present

## 2013-12-30 DIAGNOSIS — Z435 Encounter for attention to cystostomy: Secondary | ICD-10-CM | POA: Diagnosis not present

## 2013-12-30 DIAGNOSIS — G894 Chronic pain syndrome: Secondary | ICD-10-CM | POA: Diagnosis not present

## 2013-12-30 DIAGNOSIS — G35 Multiple sclerosis: Secondary | ICD-10-CM | POA: Diagnosis not present

## 2013-12-31 DIAGNOSIS — Z435 Encounter for attention to cystostomy: Secondary | ICD-10-CM | POA: Diagnosis not present

## 2013-12-31 DIAGNOSIS — G894 Chronic pain syndrome: Secondary | ICD-10-CM | POA: Diagnosis not present

## 2013-12-31 DIAGNOSIS — G35 Multiple sclerosis: Secondary | ICD-10-CM | POA: Diagnosis not present

## 2014-01-01 ENCOUNTER — Ambulatory Visit (INDEPENDENT_AMBULATORY_CARE_PROVIDER_SITE_OTHER): Admitting: Neurology

## 2014-01-01 ENCOUNTER — Encounter: Payer: Self-pay | Admitting: Neurology

## 2014-01-01 VITALS — BP 118/68 | HR 85 | Wt 150.0 lb

## 2014-01-01 DIAGNOSIS — Z435 Encounter for attention to cystostomy: Secondary | ICD-10-CM | POA: Diagnosis not present

## 2014-01-01 DIAGNOSIS — G35 Multiple sclerosis: Secondary | ICD-10-CM | POA: Diagnosis not present

## 2014-01-01 DIAGNOSIS — G894 Chronic pain syndrome: Secondary | ICD-10-CM | POA: Diagnosis not present

## 2014-01-01 NOTE — Procedures (Signed)
     History:  Angelica Wilcox is a 68 year old patient with a history of multiple sclerosis with a spastic quadriparesis. The patient comes to the office today for a baclofen pump refill. She indicates that her spasticity level is acceptable at this time.  Baclofen pump refill note  The baclofen pump site was cleaned with Betadine solution. A 21-gauge needle was inserted into the pump port site. Approximately 3.5 cc of residual baclofen was removed. 20 cc of replacement baclofen was placed into the pump at 500 mcg/cc concentration.  The pump was reprogrammed for the following settings: 85.85 micrograms per day, no change from prior settings.  The alarm volume is set at 1.5 cc. The next alarm date is 04/18/2014.  The patient tolerated the procedure well. There were no complications of the above procedure.

## 2014-01-02 DIAGNOSIS — G35 Multiple sclerosis: Secondary | ICD-10-CM | POA: Diagnosis not present

## 2014-01-02 DIAGNOSIS — Z435 Encounter for attention to cystostomy: Secondary | ICD-10-CM | POA: Diagnosis not present

## 2014-01-02 DIAGNOSIS — G894 Chronic pain syndrome: Secondary | ICD-10-CM | POA: Diagnosis not present

## 2014-01-03 ENCOUNTER — Telehealth: Payer: Self-pay | Admitting: *Deleted

## 2014-01-03 DIAGNOSIS — G35 Multiple sclerosis: Secondary | ICD-10-CM | POA: Diagnosis not present

## 2014-01-03 DIAGNOSIS — Z435 Encounter for attention to cystostomy: Secondary | ICD-10-CM | POA: Diagnosis not present

## 2014-01-03 DIAGNOSIS — G894 Chronic pain syndrome: Secondary | ICD-10-CM | POA: Diagnosis not present

## 2014-01-03 NOTE — Telephone Encounter (Signed)
Next Baclofen Pump Refill alarm date 04/18/14. Appointment scheduled 04/10/14 @ 11:45. Left voicemail about appointment. Patient is to call if this date/time does not work for her.

## 2014-01-04 DIAGNOSIS — G35 Multiple sclerosis: Secondary | ICD-10-CM | POA: Diagnosis not present

## 2014-01-04 DIAGNOSIS — G894 Chronic pain syndrome: Secondary | ICD-10-CM | POA: Diagnosis not present

## 2014-01-04 DIAGNOSIS — Z435 Encounter for attention to cystostomy: Secondary | ICD-10-CM | POA: Diagnosis not present

## 2014-01-05 ENCOUNTER — Other Ambulatory Visit: Payer: Self-pay | Admitting: Internal Medicine

## 2014-01-05 DIAGNOSIS — Z435 Encounter for attention to cystostomy: Secondary | ICD-10-CM | POA: Diagnosis not present

## 2014-01-05 DIAGNOSIS — G35 Multiple sclerosis: Secondary | ICD-10-CM | POA: Diagnosis not present

## 2014-01-05 DIAGNOSIS — G894 Chronic pain syndrome: Secondary | ICD-10-CM | POA: Diagnosis not present

## 2014-01-06 DIAGNOSIS — G894 Chronic pain syndrome: Secondary | ICD-10-CM | POA: Diagnosis not present

## 2014-01-06 DIAGNOSIS — G35 Multiple sclerosis: Secondary | ICD-10-CM | POA: Diagnosis not present

## 2014-01-06 DIAGNOSIS — Z435 Encounter for attention to cystostomy: Secondary | ICD-10-CM | POA: Diagnosis not present

## 2014-01-07 DIAGNOSIS — G35 Multiple sclerosis: Secondary | ICD-10-CM | POA: Diagnosis not present

## 2014-01-07 DIAGNOSIS — Z435 Encounter for attention to cystostomy: Secondary | ICD-10-CM | POA: Diagnosis not present

## 2014-01-07 DIAGNOSIS — G894 Chronic pain syndrome: Secondary | ICD-10-CM | POA: Diagnosis not present

## 2014-01-08 DIAGNOSIS — Z435 Encounter for attention to cystostomy: Secondary | ICD-10-CM | POA: Diagnosis not present

## 2014-01-08 DIAGNOSIS — G894 Chronic pain syndrome: Secondary | ICD-10-CM | POA: Diagnosis not present

## 2014-01-08 DIAGNOSIS — G35 Multiple sclerosis: Secondary | ICD-10-CM | POA: Diagnosis not present

## 2014-01-09 DIAGNOSIS — G894 Chronic pain syndrome: Secondary | ICD-10-CM | POA: Diagnosis not present

## 2014-01-09 DIAGNOSIS — G35 Multiple sclerosis: Secondary | ICD-10-CM | POA: Diagnosis not present

## 2014-01-09 DIAGNOSIS — Z435 Encounter for attention to cystostomy: Secondary | ICD-10-CM | POA: Diagnosis not present

## 2014-01-10 ENCOUNTER — Encounter: Payer: Self-pay | Admitting: Neurology

## 2014-01-10 DIAGNOSIS — G35 Multiple sclerosis: Secondary | ICD-10-CM | POA: Diagnosis not present

## 2014-01-10 DIAGNOSIS — Z435 Encounter for attention to cystostomy: Secondary | ICD-10-CM | POA: Diagnosis not present

## 2014-01-10 DIAGNOSIS — G894 Chronic pain syndrome: Secondary | ICD-10-CM | POA: Diagnosis not present

## 2014-01-11 DIAGNOSIS — G894 Chronic pain syndrome: Secondary | ICD-10-CM | POA: Diagnosis not present

## 2014-01-11 DIAGNOSIS — G35 Multiple sclerosis: Secondary | ICD-10-CM | POA: Diagnosis not present

## 2014-01-11 DIAGNOSIS — Z435 Encounter for attention to cystostomy: Secondary | ICD-10-CM | POA: Diagnosis not present

## 2014-01-12 DIAGNOSIS — G894 Chronic pain syndrome: Secondary | ICD-10-CM | POA: Diagnosis not present

## 2014-01-12 DIAGNOSIS — G35 Multiple sclerosis: Secondary | ICD-10-CM | POA: Diagnosis not present

## 2014-01-12 DIAGNOSIS — Z435 Encounter for attention to cystostomy: Secondary | ICD-10-CM | POA: Diagnosis not present

## 2014-01-13 DIAGNOSIS — G35 Multiple sclerosis: Secondary | ICD-10-CM | POA: Diagnosis not present

## 2014-01-13 DIAGNOSIS — G894 Chronic pain syndrome: Secondary | ICD-10-CM | POA: Diagnosis not present

## 2014-01-13 DIAGNOSIS — Z435 Encounter for attention to cystostomy: Secondary | ICD-10-CM | POA: Diagnosis not present

## 2014-01-14 DIAGNOSIS — Z435 Encounter for attention to cystostomy: Secondary | ICD-10-CM | POA: Diagnosis not present

## 2014-01-14 DIAGNOSIS — G35 Multiple sclerosis: Secondary | ICD-10-CM | POA: Diagnosis not present

## 2014-01-14 DIAGNOSIS — G894 Chronic pain syndrome: Secondary | ICD-10-CM | POA: Diagnosis not present

## 2014-01-15 DIAGNOSIS — G35 Multiple sclerosis: Secondary | ICD-10-CM | POA: Diagnosis not present

## 2014-01-15 DIAGNOSIS — G894 Chronic pain syndrome: Secondary | ICD-10-CM | POA: Diagnosis not present

## 2014-01-15 DIAGNOSIS — Z435 Encounter for attention to cystostomy: Secondary | ICD-10-CM | POA: Diagnosis not present

## 2014-01-17 DIAGNOSIS — Z435 Encounter for attention to cystostomy: Secondary | ICD-10-CM | POA: Diagnosis not present

## 2014-01-17 DIAGNOSIS — G894 Chronic pain syndrome: Secondary | ICD-10-CM | POA: Diagnosis not present

## 2014-01-17 DIAGNOSIS — G35 Multiple sclerosis: Secondary | ICD-10-CM | POA: Diagnosis not present

## 2014-01-18 DIAGNOSIS — G35 Multiple sclerosis: Secondary | ICD-10-CM | POA: Diagnosis not present

## 2014-01-18 DIAGNOSIS — G894 Chronic pain syndrome: Secondary | ICD-10-CM | POA: Diagnosis not present

## 2014-01-18 DIAGNOSIS — Z435 Encounter for attention to cystostomy: Secondary | ICD-10-CM | POA: Diagnosis not present

## 2014-01-19 DIAGNOSIS — G35 Multiple sclerosis: Secondary | ICD-10-CM | POA: Diagnosis not present

## 2014-01-19 DIAGNOSIS — G894 Chronic pain syndrome: Secondary | ICD-10-CM | POA: Diagnosis not present

## 2014-01-19 DIAGNOSIS — Z435 Encounter for attention to cystostomy: Secondary | ICD-10-CM | POA: Diagnosis not present

## 2014-01-20 DIAGNOSIS — G35 Multiple sclerosis: Secondary | ICD-10-CM | POA: Diagnosis not present

## 2014-01-20 DIAGNOSIS — Z435 Encounter for attention to cystostomy: Secondary | ICD-10-CM | POA: Diagnosis not present

## 2014-01-20 DIAGNOSIS — G894 Chronic pain syndrome: Secondary | ICD-10-CM | POA: Diagnosis not present

## 2014-01-21 DIAGNOSIS — Z435 Encounter for attention to cystostomy: Secondary | ICD-10-CM | POA: Diagnosis not present

## 2014-01-21 DIAGNOSIS — G35 Multiple sclerosis: Secondary | ICD-10-CM | POA: Diagnosis not present

## 2014-01-21 DIAGNOSIS — G894 Chronic pain syndrome: Secondary | ICD-10-CM | POA: Diagnosis not present

## 2014-01-22 DIAGNOSIS — G35 Multiple sclerosis: Secondary | ICD-10-CM | POA: Diagnosis not present

## 2014-01-22 DIAGNOSIS — G894 Chronic pain syndrome: Secondary | ICD-10-CM | POA: Diagnosis not present

## 2014-01-22 DIAGNOSIS — Z435 Encounter for attention to cystostomy: Secondary | ICD-10-CM | POA: Diagnosis not present

## 2014-01-23 DIAGNOSIS — G894 Chronic pain syndrome: Secondary | ICD-10-CM | POA: Diagnosis not present

## 2014-01-23 DIAGNOSIS — G35 Multiple sclerosis: Secondary | ICD-10-CM | POA: Diagnosis not present

## 2014-01-23 DIAGNOSIS — Z435 Encounter for attention to cystostomy: Secondary | ICD-10-CM | POA: Diagnosis not present

## 2014-01-24 DIAGNOSIS — G35 Multiple sclerosis: Secondary | ICD-10-CM | POA: Diagnosis not present

## 2014-01-24 DIAGNOSIS — G894 Chronic pain syndrome: Secondary | ICD-10-CM | POA: Diagnosis not present

## 2014-01-24 DIAGNOSIS — Z435 Encounter for attention to cystostomy: Secondary | ICD-10-CM | POA: Diagnosis not present

## 2014-01-25 DIAGNOSIS — G894 Chronic pain syndrome: Secondary | ICD-10-CM | POA: Diagnosis not present

## 2014-01-25 DIAGNOSIS — Z435 Encounter for attention to cystostomy: Secondary | ICD-10-CM | POA: Diagnosis not present

## 2014-01-25 DIAGNOSIS — G35 Multiple sclerosis: Secondary | ICD-10-CM | POA: Diagnosis not present

## 2014-01-26 DIAGNOSIS — G894 Chronic pain syndrome: Secondary | ICD-10-CM | POA: Diagnosis not present

## 2014-01-26 DIAGNOSIS — Z435 Encounter for attention to cystostomy: Secondary | ICD-10-CM | POA: Diagnosis not present

## 2014-01-26 DIAGNOSIS — G35 Multiple sclerosis: Secondary | ICD-10-CM | POA: Diagnosis not present

## 2014-01-27 DIAGNOSIS — G35 Multiple sclerosis: Secondary | ICD-10-CM | POA: Diagnosis not present

## 2014-01-27 DIAGNOSIS — Z435 Encounter for attention to cystostomy: Secondary | ICD-10-CM | POA: Diagnosis not present

## 2014-01-27 DIAGNOSIS — G894 Chronic pain syndrome: Secondary | ICD-10-CM | POA: Diagnosis not present

## 2014-01-28 DIAGNOSIS — G894 Chronic pain syndrome: Secondary | ICD-10-CM | POA: Diagnosis not present

## 2014-01-28 DIAGNOSIS — G35 Multiple sclerosis: Secondary | ICD-10-CM | POA: Diagnosis not present

## 2014-01-28 DIAGNOSIS — Z435 Encounter for attention to cystostomy: Secondary | ICD-10-CM | POA: Diagnosis not present

## 2014-01-29 DIAGNOSIS — G894 Chronic pain syndrome: Secondary | ICD-10-CM | POA: Diagnosis not present

## 2014-01-29 DIAGNOSIS — Z435 Encounter for attention to cystostomy: Secondary | ICD-10-CM | POA: Diagnosis not present

## 2014-01-29 DIAGNOSIS — F341 Dysthymic disorder: Secondary | ICD-10-CM | POA: Diagnosis not present

## 2014-01-29 DIAGNOSIS — K5732 Diverticulitis of large intestine without perforation or abscess without bleeding: Secondary | ICD-10-CM | POA: Diagnosis not present

## 2014-01-29 DIAGNOSIS — G35 Multiple sclerosis: Secondary | ICD-10-CM | POA: Diagnosis not present

## 2014-01-29 DIAGNOSIS — I10 Essential (primary) hypertension: Secondary | ICD-10-CM | POA: Diagnosis not present

## 2014-02-26 ENCOUNTER — Telehealth: Payer: Self-pay | Admitting: Internal Medicine

## 2014-02-26 NOTE — Telephone Encounter (Signed)
Verbal authorization yes for flu

## 2014-02-26 NOTE — Telephone Encounter (Signed)
Verbal given left message on nurses voicemail

## 2014-02-26 NOTE — Telephone Encounter (Signed)
Hospice asking for verbal for flu vaccine ok ?

## 2014-02-28 ENCOUNTER — Ambulatory Visit: Payer: Self-pay | Admitting: Internal Medicine

## 2014-02-28 DIAGNOSIS — K5732 Diverticulitis of large intestine without perforation or abscess without bleeding: Secondary | ICD-10-CM | POA: Diagnosis not present

## 2014-02-28 DIAGNOSIS — I10 Essential (primary) hypertension: Secondary | ICD-10-CM | POA: Diagnosis not present

## 2014-02-28 DIAGNOSIS — G35 Multiple sclerosis: Secondary | ICD-10-CM | POA: Diagnosis not present

## 2014-02-28 DIAGNOSIS — F341 Dysthymic disorder: Secondary | ICD-10-CM | POA: Diagnosis not present

## 2014-02-28 DIAGNOSIS — G894 Chronic pain syndrome: Secondary | ICD-10-CM | POA: Diagnosis not present

## 2014-02-28 DIAGNOSIS — Z435 Encounter for attention to cystostomy: Secondary | ICD-10-CM | POA: Diagnosis not present

## 2014-03-29 ENCOUNTER — Telehealth: Payer: Self-pay | Admitting: Internal Medicine

## 2014-03-29 MED ORDER — CEPHALEXIN 500 MG PO CAPS: 500.0000 mg | ORAL_CAPSULE | Freq: Four times a day (QID) | ORAL | Status: DC

## 2014-03-29 NOTE — Telephone Encounter (Signed)
Hospice called to notify that patients supra-pubic catheter has odor around insertion and that it red to the area, requesting antibiotic. Please advise.

## 2014-03-29 NOTE — Telephone Encounter (Signed)
Generic keflex  sent to pharmacy to take for infection of skin around cath site.  Please take a probiotic ( Align, Floraque or Culturelle) while you are on the antibiotic to prevent a serious antibiotic associated diarrhea  Called clostirudium dificile colitis and a vaginal yeast infection

## 2014-03-29 NOTE — Telephone Encounter (Signed)
Hospice notified of medication and dosage and stated they will notify patient.

## 2014-03-31 ENCOUNTER — Ambulatory Visit: Payer: Self-pay | Admitting: Internal Medicine

## 2014-03-31 DIAGNOSIS — K5732 Diverticulitis of large intestine without perforation or abscess without bleeding: Secondary | ICD-10-CM | POA: Diagnosis not present

## 2014-03-31 DIAGNOSIS — Z435 Encounter for attention to cystostomy: Secondary | ICD-10-CM | POA: Diagnosis not present

## 2014-03-31 DIAGNOSIS — G894 Chronic pain syndrome: Secondary | ICD-10-CM | POA: Diagnosis not present

## 2014-03-31 DIAGNOSIS — I10 Essential (primary) hypertension: Secondary | ICD-10-CM | POA: Diagnosis not present

## 2014-03-31 DIAGNOSIS — N99511 Cystostomy infection: Secondary | ICD-10-CM | POA: Diagnosis not present

## 2014-03-31 DIAGNOSIS — G35 Multiple sclerosis: Secondary | ICD-10-CM | POA: Diagnosis not present

## 2014-03-31 DIAGNOSIS — F341 Dysthymic disorder: Secondary | ICD-10-CM | POA: Diagnosis not present

## 2014-04-04 ENCOUNTER — Telehealth: Payer: Self-pay

## 2014-04-04 NOTE — Telephone Encounter (Signed)
Patient Information:  Caller Name: Aislynn  Phone: [phone]  Patient: Angelica Wilcox, Angelica Wilcox  Gender: Female  DOB: [birth year]  Age: 68 Years  PCP: Deborra Medina (Adults only)  Office Follow Up:  Does the office need to follow up with this patient?: No  Instructions For The Office: N/A   Symptoms  Reason For Call & Symptoms: Patient reports she stretched and injured her side from shoulder to waist..  She is on Oxycontin for chronic pain and Oxycodone for breakthrough and still has pain.  Bright red bloody sputum occasionally reported; she had eaten raspberries on one occasion. Pain rated at 3 of 10; 10 with coughing.  Go to ED Now or to Office with PCP Approval due to Coughing up blood.  At the end of conversation she relates she has other providers from Hospice that she may be able to get in to see.  Reviewed Health History In EMR: Yes  Reviewed Medications In EMR: Yes  Reviewed Allergies In EMR: Yes  Reviewed Surgeries / Procedures: Yes  Date of Onset of Symptoms: 04/01/2014  Treatments Tried: Oxycontin, Oxycodone for breakthrough pain  Treatments Tried Worked: No  Guideline(s) Used:  Chest Pain  Disposition Per Guideline:   Go to ED Now (or to Office with PCP Approval)  Reason For Disposition Reached:   Coughing up blood  Advice Given:  Call Back If:  Difficulty breathing  Fever  You become worse.  Patient Refused Recommendation:  Patient Refused Care Advice  Patient states she will follow up with Hospice since no appointments available at [facility] office

## 2014-04-04 NOTE — Telephone Encounter (Signed)
Pt notified and verbalized understanding.

## 2014-04-04 NOTE — Telephone Encounter (Signed)
I am out of the office this afternoon and cannot see her  She may have fractured a rib.  She should go to the ER If Hospice cannot evaluate her

## 2014-04-04 NOTE — Telephone Encounter (Signed)
Please advise 

## 2014-04-04 NOTE — Telephone Encounter (Signed)
The patient called and is hoping to get a call back regarding her coughing up blood.  Her call was immediately transferred to [facility].  I explained to the patient that these symptoms were something she needed to speak with the call-a-nurse line about.

## 2014-04-10 ENCOUNTER — Ambulatory Visit (INDEPENDENT_AMBULATORY_CARE_PROVIDER_SITE_OTHER): Admitting: Neurology

## 2014-04-10 ENCOUNTER — Encounter: Payer: Self-pay | Admitting: Neurology

## 2014-04-10 VITALS — BP 118/64 | HR 90

## 2014-04-10 DIAGNOSIS — G35 Multiple sclerosis: Secondary | ICD-10-CM

## 2014-04-10 DIAGNOSIS — R252 Cramp and spasm: Secondary | ICD-10-CM

## 2014-04-10 DIAGNOSIS — R258 Other abnormal involuntary movements: Secondary | ICD-10-CM | POA: Diagnosis not present

## 2014-04-10 NOTE — Progress Notes (Signed)
Angelica Wilcox is a 68 year old patient with primary progressive multiple sclerosis who comes to the office today for a baclofen pump refill. She has reported increasing problems with spasticity and loss of use of her right hand and arm. She is getting physical therapy, currently she is getting assistance with hospice. The patient is having difficulty straightening her fingers on the right hand. The patient is nonambulatory.  The baclofen pump refill was done today. The patient will be referred to Dr. Krista Blue for possible Botox therapy for spasticity involving the right forearm.  The next refill date is 02/24/2015.

## 2014-04-10 NOTE — Procedures (Signed)
     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 She returns to the office today for a baclofen pump refill. She has noted some progression of his function of her right arm and hand.  Baclofen pump refill note  The baclofen pump site was cleaned with Betadine solution. A 21-gauge needle was inserted into the pump port site. Approximately 3.5 cc of residual baclofen was removed. 20 cc of replacement baclofen was placed into the pump at 500 mcg/cc concentration.  The pump was reprogrammed for the following settings: 85.85 g per day continuous rate.  The alarm volume is set at 1.5 cc. The next alarm date is 07/26/2014.  The patient tolerated the procedure well. There were no complications of the above procedure.

## 2014-04-16 ENCOUNTER — Other Ambulatory Visit: Payer: Self-pay | Admitting: Internal Medicine

## 2014-04-17 ENCOUNTER — Ambulatory Visit (INDEPENDENT_AMBULATORY_CARE_PROVIDER_SITE_OTHER): Payer: Medicare Other | Admitting: *Deleted

## 2014-04-17 ENCOUNTER — Ambulatory Visit: Payer: PRIVATE HEALTH INSURANCE | Admitting: *Deleted

## 2014-04-17 DIAGNOSIS — Z23 Encounter for immunization: Secondary | ICD-10-CM | POA: Diagnosis not present

## 2014-04-29 ENCOUNTER — Other Ambulatory Visit: Payer: Self-pay | Admitting: Internal Medicine

## 2014-04-30 DIAGNOSIS — F341 Dysthymic disorder: Secondary | ICD-10-CM | POA: Diagnosis not present

## 2014-04-30 DIAGNOSIS — Z435 Encounter for attention to cystostomy: Secondary | ICD-10-CM | POA: Diagnosis not present

## 2014-04-30 DIAGNOSIS — G35 Multiple sclerosis: Secondary | ICD-10-CM | POA: Diagnosis not present

## 2014-04-30 DIAGNOSIS — G894 Chronic pain syndrome: Secondary | ICD-10-CM | POA: Diagnosis not present

## 2014-04-30 DIAGNOSIS — I10 Essential (primary) hypertension: Secondary | ICD-10-CM | POA: Diagnosis not present

## 2014-04-30 DIAGNOSIS — N99511 Cystostomy infection: Secondary | ICD-10-CM | POA: Diagnosis not present

## 2014-04-30 DIAGNOSIS — K5732 Diverticulitis of large intestine without perforation or abscess without bleeding: Secondary | ICD-10-CM | POA: Diagnosis not present

## 2014-05-01 ENCOUNTER — Encounter: Payer: Self-pay | Admitting: Neurology

## 2014-05-01 ENCOUNTER — Ambulatory Visit (INDEPENDENT_AMBULATORY_CARE_PROVIDER_SITE_OTHER): Admitting: Neurology

## 2014-05-01 DIAGNOSIS — R258 Other abnormal involuntary movements: Secondary | ICD-10-CM | POA: Diagnosis not present

## 2014-05-01 DIAGNOSIS — G35 Multiple sclerosis: Secondary | ICD-10-CM | POA: Diagnosis not present

## 2014-05-01 DIAGNOSIS — R252 Cramp and spasm: Secondary | ICD-10-CM

## 2014-05-01 NOTE — Progress Notes (Signed)
Reason for visit: Evaluation for EMG guided Botox injection for spastic quadriplegia  Angelica Wilcox is an 68 y.o. female is accompanied her caregiver, referred by Dr. Jannifer Franklin for evaluation of EMG guided Botox injection for spastic quadriplegia due to her long-standing history of multiple sclerosis  She presented with progressive worsening gait difficulty since 20-Sep-1996, there was no clear relapsing remitting course, rather her clinical course is continued progressive worsening, the diagnosis of multiple sclerosis was confirmed by abnormal MRI scan, and spinal fluids testing in [place].  She was treated with Avonex shortly from 09/20/96 to 09-20-97, this was given by her husband, a Pharmacist, community, her husband died of cancer shortly in 20-Sep-1997, afterwards, she was never treated with any long-term immunomodulation therapy, over the years, she denies any clear relapsing remitting episode, she continued to experience worsening gait difficulty, eventually become wheelchair-bound in 2004/09/20, she moved to [place] to be close to her 2 daughters,  She now lives by herself, has sitter comes in few hours each day to help her with daily activity, she also has [employer] PT students helping her each evening, she had a 40 catheter, later received the suprapubic catheter since 2009-09-20, till February 2015, she still able to get out of the wheelchair, transfer herself to the toilet without helping,  but she continue to experience gradual decline, now she needs more assistant to get in and out of wheelchair, she also noticed increased bilateral upper extremity weakness, especially her right hand, she tends to hold her right hand in elbow pronation, flexion, wrist flexion, finger flexion, leaning her body to worse the left side in her wheelchair, but she still needs her right hand finger flexion to hold on the subject to bear weight  She no longer has significant bilateral lower extremity voluteer movement, she has a tendency  for bilateral ankle plantar flexion,   She previously was treated at [facility] by 1 course of Botox injection to her right upper extremity, she did not notice any significant improvement  She also complains of diffuse muscle, joints pain, is under palliative care, receiving chronic narcrotic, and marijuana, which has been helpful   She had received baclofen pump since 21-Sep-2006, which has been very helpful, it helps relaxing her bilateral lower extremity spasticity, and also took away the side effect of sleepiness, dry mouth  Review of system: As above  Past Medical History  Diagnosis Date  . Dysthymic disorder   . Calculus of gallbladder without mention of cholecystitis or obstruction   . Other constipation   . Contracture of hand joint   . Diverticulitis of colon (without mention of hemorrhage)   . Other malaise and fatigue   . Esophageal reflux   . Headache(784.0)   . Other and unspecified hyperlipidemia   . Unspecified urinary incontinence   . Unspecified essential hypertension   . Multiple sclerosis   . Nonspecific elevation of levels of transaminase or lactic acid dehydrogenase (LDH)   . Osteoporosis, unspecified   . Other abnormality of urination(788.69)   . Rectocele   . Other alteration of consciousness   . Unspecified vitamin D deficiency   . Other B-complex deficiencies   . Dyslipidemia     Past Surgical History  Procedure Laterality Date  . Tubal ligation    . Tonsillectomy and adenoidectomy    . Colon screen  1995/09/21  . Carotid doppler  09/2002    Negative  . Dexa  12/2008    OP, slt worse  .  Baclofen pump refill  4-24/13    model No. [id number] @ [facility]    Family History  Problem Relation Age of Onset  . Cancer Father   . Hypertension Father   . Alcohol abuse Father   . Heart failure Father     CHF  . Glaucoma Mother   . Pulmonary fibrosis Mother   . Celiac disease      sibling  . Stroke Daughter     Social history:  reports that she quit smoking about  37 years ago. She has never used smokeless tobacco. She reports that she drinks alcohol. She reports that she uses illicit drugs (Marijuana).    Allergies  Allergen Reactions  . Alendronate Sodium     REACTION: GI  . Atorvastatin     REACTION: myalgia  . Bactrim Nausea And Vomiting  . Bupropion   . Ciprofloxacin Nausea And Vomiting    REACTION: vomiting  . Nitrofurantoin Diarrhea, Nausea And Vomiting and Other (See Comments)    Night sweats  . Sulfamethoxazole-Trimethoprim Nausea And Vomiting    Medications:  Current Outpatient Prescriptions on File Prior to Visit  Medication Sig Dispense Refill  . BACLOFEN IT 65.07 mcg model No. [id number] implant date 09/22/11 At [facility]    . Cholecalciferol (VITAMIN D3) 2000 UNITS TABS Take 1 tablet by mouth daily.     . DULoxetine (CYMBALTA) 60 MG capsule TAKE 1 CAPSULE BY MOUTH AT BEDTIME 15 capsule 4  . gabapentin (NEURONTIN) 600 MG tablet TAKE 1 TABLET BY MOUTH 3 TIMES A DAY 45 tablet 6  . Infant Care Products (DERMACLOUD) CREA Apply to affected areas 2-3 times a day. 430 g 2  . lisinopril (PRINIVIL,ZESTRIL) 20 MG tablet TAKE ONE TABLET BY MOUTH ONE TIME DAILY 30 tablet 5  . LORazepam (ATIVAN) 0.5 MG tablet Take 1 tablet (0.5 mg total) by mouth daily as needed. 30 tablet 3  . Misc. Devices [facility]) MISC 1 each by Other route.    . NON FORMULARY 500 mcg by Other route.    Marland Kitchen oxyCODONE-acetaminophen (PERCOCET/ROXICET) 5-325 MG per tablet Take 1-2 tablets by mouth every 6 (six) hours as needed for severe pain.     No current facility-administered medications on file prior to visit.    ROS:  Out of a complete 14 system review of symptoms, the patient complains only of the following symptoms, and all other reviewed systems are negative.  Weakness, paresthesias Memory disorder Malaise, fatigue  Height 0' (0 m), weight 0 lb (0 kg).  Physical Exam Cardiac: Regular rate rhythm Pulmonary: Clear to auscultation bilaterally   Neurologic  Exam  Mental status: The patient  no dysarthria, alert oriented.  Cranial nerves: Facial symmetry is present. Speech is normal, no aphasia or dysarthria is noted. Extraocular movements are full. Visual fields are full.  Motor: The patient has increased motor tone on the right greater left upper extremity.  She has anti-gravity movement of right proximal arm, right hand grip 2 out of 5 with right finger flexion. The patient has a partial  Fixed flexion contracture of the right elbow. maximum 170 degree. she only has trace movement of left toes, with a tendency of bilateral ankle plantar flexion   Sensory examination: Soft touch sensation is symmetric on the face, arms, legs.  Coordination: The patient has good finger-nose-finger with the left arm, some ataxia with the right arm.  Gait and station: The patient is wheelchair-bound, cannot be ambulated.  Reflexes: Deep tendon reflexes are symmetric, depressed.  Assessment/Plan:  Multiple sclerosis with progressive worsening spastic quadriplegia, partially fixed right elbow flexion, pronation, tendency for right finger flexion, right shoulder adduction, tendency for bilateral ankle plantar flexion   I have discussed with patient the expected benefit with EMG guided Botox injection, which will likely helping her relax her right shoulder, right elbow, may be mildly her right fingers, but she still using her right hand to grip on object, Botox injection might waking her right hand muscle. She may also benefit EMG guided Botox injection to prevent further bilateral ankle plantar flexion, keep the range of motion of her ankles.  Preauthorization for EMG guided Botox injection Return to clinic in 2-3 weeks   Marcial Pacas, M.D. Ph.D. 05/01/2014 12:30 PM  [facility] [address] [place], [place] [address]  Phone [phone] Fax [phone]

## 2014-05-09 ENCOUNTER — Ambulatory Visit: Payer: Self-pay | Admitting: Internal Medicine

## 2014-05-20 ENCOUNTER — Encounter: Payer: Self-pay | Admitting: Neurology

## 2014-05-20 ENCOUNTER — Ambulatory Visit (INDEPENDENT_AMBULATORY_CARE_PROVIDER_SITE_OTHER): Admitting: Neurology

## 2014-05-20 DIAGNOSIS — G35 Multiple sclerosis: Secondary | ICD-10-CM

## 2014-05-20 DIAGNOSIS — G8250 Quadriplegia, unspecified: Secondary | ICD-10-CM

## 2014-05-20 NOTE — Progress Notes (Signed)
Reason for visit: Evaluation for EMG guided Botox injection for spastic quadriplegia  Angelica Wilcox is an 68 y.o. female is accompanied her caregiver, referred by Dr. Jannifer Franklin for evaluation of EMG guided Botox injection for spastic quadriplegia due to her long-standing history of multiple sclerosis  She presented with progressive worsening gait difficulty since 08/29/1996, there was no clear relapsing remitting course, rather her clinical course is continued progressive worsening, the diagnosis of multiple sclerosis was confirmed by abnormal MRI scan, and spinal fluids testing in [place].  She was treated with Avonex shortly from 1996/08/29 to August 29, 1997, this was given by her husband, a Pharmacist, community, her husband died of cancer shortly in 1997/08/29, afterwards, she was never treated with any long-term immunomodulation therapy, over the years, she denies any clear relapsing remitting episode, she continued to experience worsening gait difficulty, eventually become wheelchair-bound in 08/29/04, she moved to [place] to be close to her 2 daughters.   She now lives by herself, has sitter comes in few hours each day to help her with daily activity, she also has [employer] PT students helping her each evening, she had a 40 catheter, later received the suprapubic catheter since 08-29-09, till February 2015, she still able to get out of the wheelchair, transfer herself to the toilet without helping,  but she continue to experience gradual decline, now she needs more assistant to get in and out of wheelchair, she also noticed increased bilateral upper extremity weakness, especially her right hand, she tends to hold her right hand in elbow pronation, flexion, wrist flexion, finger flexion, leaning her body to worse the left side in her wheelchair, but she still needs her right hand finger flexion to hold on the subject to bear weight.  She no longer has significant bilateral lower extremity voluteer movement, she has a tendency  for bilateral ankle plantar flexion,   She previously was treated at [facility] by 1 course of Botox injection to her right upper extremity, she did not notice any significant improvement  She also complains of diffuse muscle, joints pain, is under palliative care, receiving chronic narcrotic, and marijuana, which has been helpful   She had received baclofen pump since August 30, 2006, which has been very helpful, it helps relaxing her bilateral lower extremity spasticity, and also took away the side effect of sleepiness, dry mouth  UPDATE May 20 2014: She came in for the first EMG guided Botox injection for bilateral lower extremity, bilateral ankle plantar flexion potential side effect explained,  Review of system: As above  Past Medical History  Diagnosis Date  . Dysthymic disorder   . Calculus of gallbladder without mention of cholecystitis or obstruction   . Other constipation   . Contracture of hand joint   . Diverticulitis of colon (without mention of hemorrhage)   . Other malaise and fatigue   . Esophageal reflux   . Headache(784.0)   . Other and unspecified hyperlipidemia   . Unspecified urinary incontinence   . Unspecified essential hypertension   . Multiple sclerosis   . Nonspecific elevation of levels of transaminase or lactic acid dehydrogenase (LDH)   . Osteoporosis, unspecified   . Other abnormality of urination(788.69)   . Rectocele   . Other alteration of consciousness   . Unspecified vitamin D deficiency   . Other B-complex deficiencies   . Dyslipidemia     Past Surgical History  Procedure Laterality Date  . Tubal ligation    . Tonsillectomy and adenoidectomy    .  Colon screen  1997  . Carotid doppler  09/2002    Negative  . Dexa  12/2008    OP, slt worse  . Baclofen pump refill  4-24/13    model No. [id number] @ [facility]    Family History  Problem Relation Age of Onset  . Cancer Father   . Hypertension Father   . Alcohol abuse Father   . Heart failure Father      CHF  . Glaucoma Mother   . Pulmonary fibrosis Mother   . Celiac disease      sibling  . Stroke Daughter     Social history:  reports that she quit smoking about 37 years ago. She has never used smokeless tobacco. She reports that she drinks alcohol. She reports that she uses illicit drugs (Marijuana).    Allergies  Allergen Reactions  . Alendronate Sodium     REACTION: GI  . Atorvastatin     REACTION: myalgia  . Bactrim Nausea And Vomiting  . Bupropion   . Ciprofloxacin Nausea And Vomiting    REACTION: vomiting  . Nitrofurantoin Diarrhea, Nausea And Vomiting and Other (See Comments)    Night sweats  . Sulfamethoxazole-Trimethoprim Nausea And Vomiting    Medications:  Current Outpatient Prescriptions on File Prior to Visit  Medication Sig Dispense Refill  . BACLOFEN IT 65.07 mcg model No. [id number] implant date 09/22/11 At [facility]    . Cholecalciferol (VITAMIN D3) 2000 UNITS TABS Take 1 tablet by mouth daily.     . DULoxetine (CYMBALTA) 60 MG capsule TAKE 1 CAPSULE BY MOUTH AT BEDTIME 15 capsule 4  . gabapentin (NEURONTIN) 600 MG tablet TAKE 1 TABLET BY MOUTH 3 TIMES A DAY 45 tablet 6  . Infant Care Products (DERMACLOUD) CREA Apply to affected areas 2-3 times a day. 430 g 2  . lisinopril (PRINIVIL,ZESTRIL) 20 MG tablet TAKE ONE TABLET BY MOUTH ONE TIME DAILY 30 tablet 5  . LORazepam (ATIVAN) 0.5 MG tablet Take 1 tablet (0.5 mg total) by mouth daily as needed. 30 tablet 3  . Misc. Devices [facility]) MISC 1 each by Other route.    . NON FORMULARY 500 mcg by Other route.    Marland Kitchen oxyCODONE-acetaminophen (PERCOCET/ROXICET) 5-325 MG per tablet Take 1-2 tablets by mouth every 6 (six) hours as needed for severe pain.    . OXYCONTIN 10 MG T12A 12 hr tablet Take 10 mg by mouth every 12 (twelve) hours.  0   No current facility-administered medications on file prior to visit.    ROS:  Out of a complete 14 system review of symptoms, the patient complains only of the following symptoms,  and all other reviewed systems are negative.  Weakness, paresthesias Memory disorder Malaise, fatigue  Blood pressure , pulse , height 0' (0 m), weight 0 lb (0 kg).  Physical Exam Cardiac: Regular rate rhythm Pulmonary: Clear to auscultation bilaterally   Neurologic Exam  Mental status: The patient  no dysarthria, alert oriented.  Cranial nerves: Facial symmetry is present. Speech is normal, no aphasia or dysarthria is noted. Extraocular movements are full. Visual fields are full.  Motor: The patient has increased motor tone on the right greater left upper extremity.  She has anti-gravity movement of right proximal arm, right hand grip 2 out of 5 with right finger flexion. The patient has a partial  Fixed flexion contracture of the right elbow. maximum 170 degree. she only has trace movement of left toes, with a tendency of bilateral ankle plantar flexion  Sensory examination: Soft touch sensation is symmetric on the face, arms, legs.  Coordination: The patient has good finger-nose-finger with the left arm, some ataxia with the right arm.  Gait and station: The patient is wheelchair-bound, cannot be ambulated.  Reflexes: Deep tendon reflexes are symmetric, depressed.   Assessment/Plan:  Multiple sclerosis with progressive worsening spastic quadriplegia, partially fixed right elbow flexion, pronation, tendency for right finger flexion, right shoulder adduction, tendency for bilateral ankle plantar flexion   Under EMG guidance, 200 units of Botox,  Right tibialis posterior 25 units x2= 50 Right flexor digitorum longus 25 x2= 50 units  Left tibialis posterior 25 units x2= 50 Left flexor digitorum longus 25 x2= 50 units  Marcial Pacas, M.D. Ph.D. 05/20/2014 12:38 PM  [facility] [address] [place], [place] [address]  Phone [phone] Fax [phone]

## 2014-05-21 DIAGNOSIS — G8250 Quadriplegia, unspecified: Secondary | ICD-10-CM | POA: Insufficient documentation

## 2014-05-21 MED ORDER — ONABOTULINUMTOXINA 100 UNITS IJ SOLR
200.0000 [IU] | Freq: Once | INTRAMUSCULAR | Status: AC
  Administered 2014-05-21: 200 [IU] via INTRAMUSCULAR

## 2014-05-31 ENCOUNTER — Ambulatory Visit: Payer: Self-pay | Admitting: Internal Medicine

## 2014-05-31 DIAGNOSIS — K5732 Diverticulitis of large intestine without perforation or abscess without bleeding: Secondary | ICD-10-CM | POA: Diagnosis not present

## 2014-05-31 DIAGNOSIS — G894 Chronic pain syndrome: Secondary | ICD-10-CM | POA: Diagnosis not present

## 2014-05-31 DIAGNOSIS — F341 Dysthymic disorder: Secondary | ICD-10-CM | POA: Diagnosis not present

## 2014-05-31 DIAGNOSIS — N99511 Cystostomy infection: Secondary | ICD-10-CM | POA: Diagnosis not present

## 2014-05-31 DIAGNOSIS — G35 Multiple sclerosis: Secondary | ICD-10-CM | POA: Diagnosis not present

## 2014-05-31 DIAGNOSIS — I10 Essential (primary) hypertension: Secondary | ICD-10-CM | POA: Diagnosis not present

## 2014-05-31 DIAGNOSIS — Z435 Encounter for attention to cystostomy: Secondary | ICD-10-CM | POA: Diagnosis not present

## 2014-06-04 ENCOUNTER — Other Ambulatory Visit: Payer: Self-pay | Admitting: *Deleted

## 2014-06-04 MED ORDER — LISINOPRIL 20 MG PO TABS: ORAL_TABLET | ORAL | Status: DC

## 2014-06-10 ENCOUNTER — Other Ambulatory Visit: Payer: Self-pay | Admitting: Internal Medicine

## 2014-06-10 MED ORDER — GABAPENTIN 600 MG PO TABS: 600.0000 mg | ORAL_TABLET | Freq: Three times a day (TID) | ORAL | Status: DC

## 2014-06-10 NOTE — Telephone Encounter (Signed)
Script sent as patient requested.

## 2014-06-10 NOTE — Telephone Encounter (Signed)
Patient called for refill on Gabapentin called patient to enquire because medication was filled 11/15 with 5 refills. Left message for patient to call office.

## 2014-06-10 NOTE — Telephone Encounter (Signed)
Left message for patient to call office.  

## 2014-06-10 NOTE — Telephone Encounter (Signed)
Patient stated that Hospice sent in script for 800 mg of Gabapentin  When this occurred the 600 mg gabapentin was cancelled out and patient would like to go back on the 600 mg dose, also script needs to say for Hospice patient have added and pended medication for MD approval.

## 2014-06-10 NOTE — Telephone Encounter (Signed)
Fine to change back to Gabapentin 600mg  dosing and refill with 5 refills.

## 2014-06-24 ENCOUNTER — Ambulatory Visit: Payer: PRIVATE HEALTH INSURANCE | Admitting: Internal Medicine

## 2014-06-26 ENCOUNTER — Encounter: Payer: Self-pay | Admitting: Internal Medicine

## 2014-07-01 DIAGNOSIS — I10 Essential (primary) hypertension: Secondary | ICD-10-CM | POA: Diagnosis not present

## 2014-07-01 DIAGNOSIS — K5732 Diverticulitis of large intestine without perforation or abscess without bleeding: Secondary | ICD-10-CM | POA: Diagnosis not present

## 2014-07-01 DIAGNOSIS — G35 Multiple sclerosis: Secondary | ICD-10-CM | POA: Diagnosis not present

## 2014-07-01 DIAGNOSIS — F341 Dysthymic disorder: Secondary | ICD-10-CM | POA: Diagnosis not present

## 2014-07-01 DIAGNOSIS — Z435 Encounter for attention to cystostomy: Secondary | ICD-10-CM | POA: Diagnosis not present

## 2014-07-01 DIAGNOSIS — G894 Chronic pain syndrome: Secondary | ICD-10-CM | POA: Diagnosis not present

## 2014-07-01 DIAGNOSIS — N99511 Cystostomy infection: Secondary | ICD-10-CM | POA: Diagnosis not present

## 2014-07-26 ENCOUNTER — Encounter: Payer: Self-pay | Admitting: Internal Medicine

## 2014-07-26 ENCOUNTER — Ambulatory Visit (INDEPENDENT_AMBULATORY_CARE_PROVIDER_SITE_OTHER): Admitting: Internal Medicine

## 2014-07-26 VITALS — BP 118/68 | HR 90 | Temp 98.3°F | Resp 14

## 2014-07-26 DIAGNOSIS — M792 Neuralgia and neuritis, unspecified: Secondary | ICD-10-CM

## 2014-07-26 DIAGNOSIS — Z66 Do not resuscitate: Secondary | ICD-10-CM

## 2014-07-26 DIAGNOSIS — Z515 Encounter for palliative care: Secondary | ICD-10-CM | POA: Diagnosis not present

## 2014-07-26 DIAGNOSIS — G5692 Unspecified mononeuropathy of left upper limb: Secondary | ICD-10-CM | POA: Diagnosis not present

## 2014-07-26 DIAGNOSIS — G35 Multiple sclerosis: Secondary | ICD-10-CM | POA: Diagnosis not present

## 2014-07-26 NOTE — Progress Notes (Signed)
Patient also would like to make sure Advance Directive is up to date.

## 2014-07-26 NOTE — Patient Instructions (Signed)
We have your updated MOST form on file  If your esophageal spasms recur, we can try sublingual hyoscyamine

## 2014-07-26 NOTE — Progress Notes (Signed)
Patient ID: Angelica Wilcox, female   DOB: 03-11-1946, 69 y.o.   MRN: [id number]  Patient Active Problem List   Diagnosis Date Noted  . Encounter for end of life care 07/28/2014  . Neuropathic pain of hand 07/28/2014  . 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 Suprapubic catheter 12/20/2013  . Nausea alone 09/22/2013  . Unspecified constipation 09/22/2013  . Encounter for long-term (current) use of other medications 09/22/2013  . Chronic pain 05/30/2013  . Dermatitis 05/30/2013  . Candidiasis of skin 05/13/2013  . Counseling regarding end of life decision making 05/11/2013  . Do not resuscitate 05/11/2013  . Pressure sore 09/15/2012  . Cognitive complaints with normal neuropsychological exam 08/26/2012  . Protein-calorie malnutrition, mild 11/14/2011  . Hypertension 10/02/2010  . 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 Unspecified vitamin D deficiency 07/05/2008  . VITAMIN B12 DEFICIENCY 06/04/2008  . ANXIETY DEPRESSION 05/29/2008  . DIVERTICULITIS, COLON 08/03/2007  . CHOLELITHIASIS, ASYMPTOMATIC 08/03/2007  . HYPERLIPIDEMIA 11/04/2006  . Multiple sclerosis, primary progressive 11/04/2006  . GERD 11/04/2006  . PROLAPSE, VAGINAL WALL, RECTOCELE 11/04/2006  . OSTEOPOROSIS 11/04/2006  . Neurogenic bladder 11/04/2006    Subjective:  CC:   Chief Complaint  Patient presents with  . Follow-up    6 month general has had a spike in breakthrough pain which has been relieved by increase in Oxycontin frequency.  . Fatigue    Patient stated she feels weaker and has to use arms for support.    HPI:   Angelica Wilcox is a 69 y.o. female who presents for  Follow up  On chronic conditions including MS with chronic pain and progressive muscle weakness and spasticity.  Patient's pain is being managed by Palliative Care with long and short acting opiates.  Her  regimen for pain mgmt is oxycontin 10 mg tid .  Oxycodone as needed.  Gabapentin dose was increased in December by Palliative  Care,  Has occasional increase in pain due to overdoing it,  And today she is in more pain than usual due to increased activities preparing for her birthday celebreation.  She is becoming more disabled and more forgetful,  Has lost use of left hand and now requires use of straps to keep legs from splaying open.  24 hr caregiver present today,   She is requesting an annual review of her  MOST form with the goal of decreasing interventions that will prolong her life   Past Medical History  Diagnosis Date  . Dysthymic disorder   . Calculus of gallbladder without mention of cholecystitis or obstruction   . Other constipation   . Contracture of hand joint   . Diverticulitis of colon (without mention of hemorrhage)   . Other malaise and fatigue   . Esophageal reflux   . Headache(784.0)   . Other and unspecified hyperlipidemia   . Unspecified urinary incontinence   . Unspecified essential hypertension   . Multiple sclerosis   . Nonspecific elevation of levels of transaminase or lactic acid dehydrogenase (LDH)   . Osteoporosis, unspecified   . Other abnormality of urination(788.69)   . Rectocele   . Other alteration of consciousness   . Unspecified vitamin D deficiency   . Other B-complex deficiencies   . Dyslipidemia     Past Surgical History  Procedure Laterality Date  . Tubal ligation    . Tonsillectomy and adenoidectomy    . Colon screen  1997  . Carotid doppler  09/2002    Negative  . Dexa  12/2008    OP, slt worse  . Baclofen pump refill  4-24/13    model No. [id number] @ [facility]       The following portions of the patient's history were reviewed and updated as appropriate: Allergies, current medications, and problem list.    Review of Systems:   Patient denies headache, fevers, malaise, unintentional weight loss, skin rash, eye pain, sinus congestion and sinus pain, sore throat, dysphagia,  hemoptysis , cough, dyspnea, wheezing, chest pain, palpitations, orthopnea, edema,  abdominal pain, nausea, melena, diarrhea, constipation, flank pain, dysuria, hematuria, urinary  Frequency, nocturia, numbness, tingling, seizures,  , Loss of consciousness,  Tremor, insomnia, depression, anxiety, and suicidal ideation.     History   Social History  . Marital Status: Widowed    Spouse Name: N/A  . Number of Children: 2  . Years of Education: college   Occupational History  . Disabled    Social History Main Topics  . Smoking status: Former Smoker    Quit date: 05/31/1976  . Smokeless tobacco: Never Used  . Alcohol Use: Yes     Comment: rarely drinks alcohol, The patient uses daily marijuana.  . Drug Use: Yes    Special: Marijuana  . Sexual Activity: Not on file   Other Topics Concern  . Not on file   Social History Narrative   Disabled from MS-wheelchair bound   Patient lives at home alone and she is widowed.   Disabled.   Education college.   Left handed.    Objective:  Filed Vitals:   07/26/14 1011  BP: 118/68  Pulse: 90  Temp: 98.3 F (36.8 C)  Resp: 14     General appearance: alert, cooperative and appears stated age.  In motorized wheelchair,  Ears: normal TM's and external ear canals both ears Throat: lips, mucosa, and tongue normal; teeth and gums normal Neck: no adenopathy, no carotid bruit, supple, symmetrical, trachea midline and thyroid not enlarged, symmetric, no tenderness/mass/nodules Back: symmetric, no curvature. ROM normal. No CVA tenderness. Lungs: clear to auscultation bilaterally Heart: regular rate and rhythm, S1, S2 normal, no murmur, click, rub or gallop Abdomen: soft, non-tender; bowel sounds normal; no masses,  no organomegaly Pulses: 2+ and symmetric Skin: Skin color, texture, turgor normal. No rashes or lesions Lymph nodes: Cervical, supraclavicular, and axillary nodes normal. MSK: right hand contracture, bilateral lower extremity flaccid paralysis  Assessment and Plan:  Encounter for end of life care MOST form  was updated and all interventions discussed in detail.    Do not resuscitate She has a DNR order posted at home in a highly visible place.  MOST form was updated today to reduce use of antibiotics.    Neuropathic pain of hand Managed with increased gabapentin dose of 800 mg tid by Dr Ermalinda Memos. Per dec 2015 OV noted.    Multiple sclerosis, primary progressive Progressive,  Now with limited use of left hand , which was her only remaining functional extremity.  Has 24 hr caregiver.    A total of 25 minutes of face to face time was spent with patient more than half of which was spent in counselling on the above mentioned issues.  Updated Medication List Outpatient Encounter Prescriptions as of 07/26/2014  Medication Sig  . BACLOFEN IT 65.07 mcg model No. [id number] implant date 09/22/11 At [facility]  . Cholecalciferol (VITAMIN D3) 2000 UNITS TABS Take 1 tablet by mouth daily.   . DULoxetine (CYMBALTA) 60 MG capsule TAKE 1 CAPSULE BY MOUTH AT BEDTIME  .  gabapentin (NEURONTIN) 600 MG tablet Take 1 tablet (600 mg total) by mouth 3 (three) times daily.  . [place] (DERMACLOUD) CREA Apply to affected areas 2-3 times a day.  . lisinopril (PRINIVIL,ZESTRIL) 20 MG tablet TAKE ONE TABLET BY MOUTH ONE TIME DAILY  . LORazepam (ATIVAN) 0.5 MG tablet Take 1 tablet (0.5 mg total) by mouth daily as needed.  . Misc. Devices [facility]) MISC 1 each by Other route.  . NON FORMULARY 500 mcg by Other route.  Marland Kitchen oxyCODONE-acetaminophen (PERCOCET/ROXICET) 5-325 MG per tablet Take 1-2 tablets by mouth every 6 (six) hours as needed for severe pain.  . OXYCONTIN 10 MG T12A 12 hr tablet Take 10 mg by mouth 3 (three) times daily.      No orders of the defined types were placed in this encounter.    No Follow-up on file.

## 2014-07-26 NOTE — Progress Notes (Signed)
Pre-visit discussion using our clinic review tool. No additional management support is needed unless otherwise documented below in the visit note.  

## 2014-07-28 ENCOUNTER — Encounter: Payer: Self-pay | Admitting: Internal Medicine

## 2014-07-28 DIAGNOSIS — Z515 Encounter for palliative care: Secondary | ICD-10-CM | POA: Insufficient documentation

## 2014-07-28 DIAGNOSIS — M792 Neuralgia and neuritis, unspecified: Secondary | ICD-10-CM | POA: Insufficient documentation

## 2014-07-28 NOTE — Assessment & Plan Note (Signed)
Progressive,  Now with limited use of left hand , which was her only remaining functional extremity.  Has 24 hr caregiver.

## 2014-07-28 NOTE — Assessment & Plan Note (Signed)
Managed with increased gabapentin dose of 800 mg tid by Dr Ermalinda Memos. Per dec 2015 OV noted.

## 2014-07-28 NOTE — Assessment & Plan Note (Signed)
MOST form was updated and all interventions discussed in detail.

## 2014-07-28 NOTE — Assessment & Plan Note (Signed)
She has a DNR order posted at home in a highly visible place.  MOST form was updated today to reduce use of antibiotics.

## 2014-07-29 ENCOUNTER — Telehealth: Payer: Self-pay | Admitting: Neurology

## 2014-07-29 NOTE — Telephone Encounter (Signed)
It says in last note that her next alarm date is 07-26-14.  No appointment had been scheduled for the refill.  Please advise when to have patient come.

## 2014-07-29 NOTE — Telephone Encounter (Signed)
We have no choice but to work patient in at the end of the day to refill the pump. May do this Tuesday or Wednesday.

## 2014-07-29 NOTE — Telephone Encounter (Signed)
Patient's stated her Baclofen Pump alarm is going off.  Please call and advise.

## 2014-07-30 ENCOUNTER — Telehealth: Payer: Self-pay | Admitting: Neurology

## 2014-07-30 ENCOUNTER — Ambulatory Visit (INDEPENDENT_AMBULATORY_CARE_PROVIDER_SITE_OTHER): Admitting: Neurology

## 2014-07-30 ENCOUNTER — Encounter: Payer: Self-pay | Admitting: Neurology

## 2014-07-30 VITALS — BP 140/76 | HR 92

## 2014-07-30 DIAGNOSIS — Z435 Encounter for attention to cystostomy: Secondary | ICD-10-CM | POA: Diagnosis not present

## 2014-07-30 DIAGNOSIS — G8389 Other specified paralytic syndromes: Secondary | ICD-10-CM | POA: Diagnosis not present

## 2014-07-30 DIAGNOSIS — G35 Multiple sclerosis: Secondary | ICD-10-CM

## 2014-07-30 DIAGNOSIS — I10 Essential (primary) hypertension: Secondary | ICD-10-CM | POA: Diagnosis not present

## 2014-07-30 DIAGNOSIS — G894 Chronic pain syndrome: Secondary | ICD-10-CM | POA: Diagnosis not present

## 2014-07-30 DIAGNOSIS — G8250 Quadriplegia, unspecified: Secondary | ICD-10-CM

## 2014-07-30 DIAGNOSIS — K5732 Diverticulitis of large intestine without perforation or abscess without bleeding: Secondary | ICD-10-CM | POA: Diagnosis not present

## 2014-07-30 DIAGNOSIS — N99511 Cystostomy infection: Secondary | ICD-10-CM | POA: Diagnosis not present

## 2014-07-30 DIAGNOSIS — F341 Dysthymic disorder: Secondary | ICD-10-CM | POA: Diagnosis not present

## 2014-07-30 MED ORDER — BACLOFEN 10000 MCG/20ML IT SOLN
10000.0000 ug | Freq: Once | INTRATHECAL | Status: AC
  Administered 2014-07-30: 10000 ug via INTRATHECAL

## 2014-07-30 NOTE — Progress Notes (Signed)
Please refer to baclofen pump refill note.

## 2014-07-30 NOTE — Procedures (Signed)
     History:  Angelica Wilcox is a 69 year old patient with a history of primary progressive multiple sclerosis associated with a spastic quadriparesis. The patient has a baclofen pump in place, she returns for a baclofen pump refill.  Baclofen pump refill note  The baclofen pump site was cleaned with Betadine solution. A 21-gauge needle was inserted into the pump port site. Approximately 2 cc of residual baclofen was removed, the pump indicates that it has 0.9 mL of residual baclofen. 20 cc of replacement baclofen was placed into the pump at 500 mcg/cc concentration. This pump only holds 20 mL of volume.  The pump was reprogrammed for the following settings: The pump was kept at a simple continuous rate of 85.85 g per day.  The alarm volume is set at 1.5 mL. The next alarm date is 11/14/2014.  The patient tolerated the procedure well. There were no complications of the above procedure.  The [facility] number is [phone] The baclofen expiration date is 7/17. The baclofen lot number is [id number].  This pump holds only 20 mL volume.

## 2014-07-30 NOTE — Telephone Encounter (Signed)
Patient will come in today to get her pump refilled at 4:15pm.  But I will have to put her on the schedule for 2 for overbooking reasons.

## 2014-07-30 NOTE — Telephone Encounter (Signed)
Left message to have patient come in today or tomorrow at 4:15pm to have her pump refilled.  I asked her to tell operator which day she will come.

## 2014-07-30 NOTE — Telephone Encounter (Signed)
Pt called back and states she wants to come in today.  Please call back and advise.

## 2014-07-30 NOTE — Telephone Encounter (Signed)
Please call the patient to schedule their next appt. Because they were not sure how it went. Her best number to contact her is [phone]

## 2014-08-01 NOTE — Telephone Encounter (Signed)
Left message that I have scheduled her appointment for Baclofen pump refill for 11-06-14 at 12noon.  I asked that she call back to confirm the time.

## 2014-08-02 ENCOUNTER — Telehealth: Payer: Self-pay | Admitting: Internal Medicine

## 2014-08-02 MED ORDER — FUROSEMIDE 20 MG PO TABS: 20.0000 mg | ORAL_TABLET | Freq: Every day | ORAL | Status: DC

## 2014-08-02 NOTE — Telephone Encounter (Signed)
Spoke with pt, advised of MDs message.  Left message on Teresa's VM advising of Bmet order and Rx.

## 2014-08-02 NOTE — Telephone Encounter (Signed)
Teresa the [facility] nurse called and stated that patient is having 2+ non-pitting edema in bilateral legs BP increased to 130/60. Aware that patient was seen recently in office but that patient did not mention the Edema at visit please advise.

## 2014-08-02 NOTE — Telephone Encounter (Signed)
I have sent an rx for furosemide to her pharamacy to take once daily in the morning.  The home health RN will need to check a BMET on her next week as this has not been done in nearly a year

## 2014-08-13 NOTE — Telephone Encounter (Signed)
Spoke to patient to confirm appointment for 11-06-14 at 12 noon for Baclofen pump refill.

## 2014-08-14 ENCOUNTER — Telehealth: Payer: Self-pay | Admitting: Internal Medicine

## 2014-08-14 DIAGNOSIS — E876 Hypokalemia: Secondary | ICD-10-CM

## 2014-08-14 NOTE — Telephone Encounter (Signed)
Yes, order for BMET placed

## 2014-08-14 NOTE — Telephone Encounter (Signed)
Hospice nurse has tried for 3 days to collect BMET as ordered but has not been able to collect, Hospice would like to know if you would like patient to come to office. Please advise.

## 2014-08-14 NOTE — Telephone Encounter (Signed)
Left detailed message for hospice nurse

## 2014-08-21 ENCOUNTER — Encounter: Payer: Self-pay | Admitting: Neurology

## 2014-08-21 ENCOUNTER — Encounter: Payer: Self-pay | Admitting: *Deleted

## 2014-08-21 ENCOUNTER — Ambulatory Visit (INDEPENDENT_AMBULATORY_CARE_PROVIDER_SITE_OTHER): Admitting: Neurology

## 2014-08-21 VITALS — BP 132/78 | HR 72

## 2014-08-21 DIAGNOSIS — G8250 Quadriplegia, unspecified: Secondary | ICD-10-CM | POA: Diagnosis not present

## 2014-08-21 DIAGNOSIS — G35 Multiple sclerosis: Secondary | ICD-10-CM | POA: Diagnosis not present

## 2014-08-21 MED ORDER — ONABOTULINUMTOXINA 100 UNITS IJ SOLR
300.0000 [IU] | Freq: Once | INTRAMUSCULAR | Status: AC
  Administered 2014-08-21: 300 [IU] via INTRAMUSCULAR

## 2014-08-21 NOTE — Progress Notes (Signed)
Reason for visit: Evaluation for EMG guided Botox injection for spastic quadriplegia  Angelica Wilcox is an 69 y.o. female is accompanied her caregiver, referred by Dr. Jannifer Franklin for evaluation of EMG guided Botox injection for spastic quadriplegia due to her long-standing history of multiple sclerosis  She presented with progressive worsening gait difficulty since September 25, 1996, there was no clear relapsing remitting course, rather her clinical course is continued progressive worsening, the diagnosis of multiple sclerosis was confirmed by abnormal MRI scan, and spinal fluids testing in [place].  She was treated with Avonex shortly from 1996-09-25 to 09-25-1997, this was given by her husband, a Pharmacist, community, her husband died of cancer shortly in 09/25/1997, afterwards, she was never treated with any long-term immunomodulation therapy, over the years, she denies any clear relapsing remitting episode, she continued to experience worsening gait difficulty, eventually become wheelchair-bound in September 25, 2004, she moved to [place] to be close to her 2 daughters.   She now lives by herself, has sitter comes in few hours each day to help her with daily activity, she also has [employer] PT students helping her each evening, she had a 40 catheter, later received the suprapubic catheter since 09/25/2009, till February 2015, she still able to get out of the wheelchair, transfer herself to the toilet without helping,  but she continue to experience gradual decline, now she needs more assistant to get in and out of wheelchair, she also noticed increased bilateral upper extremity weakness, especially her right hand, she tends to hold her right hand in elbow pronation, flexion, wrist flexion, finger flexion, leaning her body to worse the left side in her wheelchair, but she still needs her right hand finger flexion to hold on the subject to bear weight.  She no longer has significant bilateral lower extremity voluteer movement, she has a tendency  for bilateral ankle plantar flexion,   She previously was treated at [facility] by 1 course of Botox injection to her right upper extremity, she did not notice any significant improvement  She also complains of diffuse muscle, joints pain, is under palliative care, receiving chronic narcrotic, and marijuana, which has been helpful   She had received baclofen pump since 2006-09-26, which has been very helpful, it helps relaxing her bilateral lower extremity spasticity, and also took away the side effect of sleepiness, dry mouth  UPDATE September 26, 2014  She had her first EMG guided Botox injection in December 2015 to bilateral lower extremity, noticed mild improvement, less bilateral ankle plantarflexion, no significant side effect,  Review of system: As above  Past Medical History  Diagnosis Date  . Dysthymic disorder   . Calculus of gallbladder without mention of cholecystitis or obstruction   . Other constipation   . Contracture of hand joint   . Diverticulitis of colon (without mention of hemorrhage)   . Other malaise and fatigue   . Esophageal reflux   . Headache(784.0)   . Other and unspecified hyperlipidemia   . Unspecified urinary incontinence   . Unspecified essential hypertension   . Multiple sclerosis   . Nonspecific elevation of levels of transaminase or lactic acid dehydrogenase (LDH)   . Osteoporosis, unspecified   . Other abnormality of urination(788.69)   . Rectocele   . Other alteration of consciousness   . Unspecified vitamin D deficiency   . Other B-complex deficiencies   . Dyslipidemia     Past Surgical History  Procedure Laterality Date  . Tubal ligation    . Tonsillectomy  and adenoidectomy    . Colon screen  1997  . Carotid doppler  09/2002    Negative  . Dexa  12/2008    OP, slt worse  . Baclofen pump refill  4-24/13    model No. [id number] @ [facility]    Family History  Problem Relation Age of Onset  . Cancer Father   . Hypertension Father   . Alcohol abuse  Father   . Heart failure Father     CHF  . Glaucoma Mother   . Pulmonary fibrosis Mother   . Celiac disease      sibling  . Stroke Daughter     Social history:  reports that she quit smoking about 38 years ago. She has never used smokeless tobacco. She reports that she drinks alcohol. She reports that she uses illicit drugs (Marijuana).    Allergies  Allergen Reactions  . Alendronate Sodium     REACTION: GI  . Atorvastatin     REACTION: myalgia  . Bactrim Nausea And Vomiting  . Bupropion   . Ciprofloxacin Nausea And Vomiting    REACTION: vomiting  . Nitrofurantoin Diarrhea, Nausea And Vomiting and Other (See Comments)    Night sweats  . Sulfamethoxazole-Trimethoprim Nausea And Vomiting    Medications:  Current Outpatient Prescriptions on File Prior to Visit  Medication Sig Dispense Refill  . BACLOFEN IT 65.07 mcg model No. [id number] implant date 09/22/11 At [facility]    . Cholecalciferol (VITAMIN D3) 2000 UNITS TABS Take 1 tablet by mouth daily.     . DULoxetine (CYMBALTA) 60 MG capsule TAKE 1 CAPSULE BY MOUTH AT BEDTIME 15 capsule 4  . furosemide (LASIX) 20 MG tablet Take 1 tablet (20 mg total) by mouth daily. 30 tablet 3  . gabapentin (NEURONTIN) 600 MG tablet Take 1 tablet (600 mg total) by mouth 3 (three) times daily. 45 tablet 6  . [employer] (DERMACLOUD) CREA Apply to affected areas 2-3 times a day. 430 g 2  . lisinopril (PRINIVIL,ZESTRIL) 20 MG tablet TAKE ONE TABLET BY MOUTH ONE TIME DAILY 30 tablet 5  . LORazepam (ATIVAN) 0.5 MG tablet Take 1 tablet (0.5 mg total) by mouth daily as needed. 30 tablet 3  . Misc. Devices [facility]) MISC 1 each by Other route.    . NON FORMULARY 500 mcg by Other route.    Marland Kitchen oxyCODONE-acetaminophen (PERCOCET/ROXICET) 5-325 MG per tablet Take 1-2 tablets by mouth every 6 (six) hours as needed for severe pain.    . OXYCONTIN 10 MG T12A 12 hr tablet Take 10 mg by mouth 3 (three) times daily.   0   No current facility-administered  medications on file prior to visit.    ROS:  Out of a complete 14 system review of symptoms, the patient complains only of the following symptoms, and all other reviewed systems are negative.  Weakness, paresthesias Memory disorder Malaise, fatigue  Blood pressure 132/78, pulse 72, height 0' (0 m), weight 0 lb (0 kg).  Physical Exam Cardiac: Regular rate rhythm Pulmonary: Clear to auscultation bilaterally   Neurologic Exam  Mental status: The patient  no dysarthria, alert oriented.  Cranial nerves: Facial symmetry is present. Speech is normal, no aphasia or dysarthria is noted. Extraocular movements are full. Visual fields are full.  Motor: The patient has increased motor tone on the right greater left upper extremity.  She has anti-gravity movement of right proximal arm, right hand grip 2 out of 5 with right finger flexion. The patient has  a partial fixed flexion contracture of the right elbow. maximum 170 degree. she only has trace movement of left toes, with a tendency of bilateral ankle plantar flexion   Sensory examination: Soft touch sensation is symmetric on the face, arms, legs.  Coordination: The patient has good finger-nose-finger with the left arm, some ataxia with the right arm.  Gait and station: The patient is wheelchair-bound, cannot be ambulated.  Reflexes: Deep tendon reflexes are symmetric, depressed.   Assessment/Plan: 69 year old female, Multiple sclerosis with progressive worsening spastic quadriplegia, partially fixed right elbow flexion, pronation, tendency for right finger flexion, right shoulder adduction, tendency for bilateral ankle plantar flexion   Under electrical stimulation, 300 units of Botox,  Right tibialis posterior 25 units x2= 50 Right flexor digitorum longus 25 x2= 50 units  Left tibialis posterior 25 units x2= 50 Left flexor digitorum longus 25 x2= 50 units  Right pronator teres 25 Right flexor digitorum profundi 25 Right flexor  digitorum superficialis .5 Right palmaris longus 12.5 Right flexor carpi ulnaris 12.5  She will return to clinic in 3 months for repeat injection  Marcial Pacas, M.D. Ph.D. 08/21/2014 2:35 PM  [facility] [address] [place], [place] [address]  Phone [phone] Fax [phone]

## 2014-08-30 DIAGNOSIS — Z435 Encounter for attention to cystostomy: Secondary | ICD-10-CM | POA: Diagnosis not present

## 2014-08-30 DIAGNOSIS — G35 Multiple sclerosis: Secondary | ICD-10-CM | POA: Diagnosis not present

## 2014-08-30 DIAGNOSIS — I10 Essential (primary) hypertension: Secondary | ICD-10-CM | POA: Diagnosis not present

## 2014-08-30 DIAGNOSIS — F341 Dysthymic disorder: Secondary | ICD-10-CM | POA: Diagnosis not present

## 2014-08-30 DIAGNOSIS — N99511 Cystostomy infection: Secondary | ICD-10-CM | POA: Diagnosis not present

## 2014-08-30 DIAGNOSIS — K5732 Diverticulitis of large intestine without perforation or abscess without bleeding: Secondary | ICD-10-CM | POA: Diagnosis not present

## 2014-08-30 DIAGNOSIS — G894 Chronic pain syndrome: Secondary | ICD-10-CM | POA: Diagnosis not present

## 2014-09-09 ENCOUNTER — Telehealth: Payer: Self-pay | Admitting: Neurology

## 2014-09-09 NOTE — Telephone Encounter (Signed)
I spoke to Levada Dy, one of the patient's caretakers, and requested to ask the patient to call me back to confirm her interest in volunteering for the [employer] on Friday, [id number].

## 2014-09-10 ENCOUNTER — Other Ambulatory Visit: Payer: PRIVATE HEALTH INSURANCE

## 2014-09-13 ENCOUNTER — Telehealth: Payer: Self-pay | Admitting: Neurology

## 2014-09-13 NOTE — Telephone Encounter (Signed)
I have called her at [phone], she had left hand weakness following last BOTOX injection, no benefit from LEs injections.  Michelle/Janisha, please cancel her next injection appt.

## 2014-09-18 ENCOUNTER — Other Ambulatory Visit (INDEPENDENT_AMBULATORY_CARE_PROVIDER_SITE_OTHER)

## 2014-09-18 DIAGNOSIS — E876 Hypokalemia: Secondary | ICD-10-CM

## 2014-09-18 LAB — BASIC METABOLIC PANEL
BUN: 11 mg/dL (ref 6–23)
CHLORIDE: 96 meq/L (ref 96–112)
CO2: 32 mEq/L (ref 19–32)
Calcium: 9.6 mg/dL (ref 8.4–10.5)
Creatinine, Ser: 0.57 mg/dL (ref 0.40–1.20)
GFR: 112.06 mL/min (ref >60.00)
GLUCOSE: 114 mg/dL — AB (ref 70–99)
Potassium: 4.3 mEq/L (ref 3.5–5.1)
Sodium: 134 mEq/L — ABNORMAL LOW (ref 135–145)

## 2014-09-19 ENCOUNTER — Telehealth: Payer: Self-pay | Admitting: Neurology

## 2014-09-19 ENCOUNTER — Encounter: Payer: Self-pay | Admitting: Internal Medicine

## 2014-09-19 ENCOUNTER — Encounter: Payer: Self-pay | Admitting: Neurology

## 2014-09-19 NOTE — Telephone Encounter (Signed)
mailed letter to inform patient of workflow change and discontinuing injections.

## 2014-09-21 NOTE — Consult Note (Signed)
   Comments   Email from pt's hospice RN regarding pt's pain medication. Pt is taking Percocet 5/325 4 times a day. Would like to try extended release oxycodone as I suggested at last clinic visit. Rx for oxycodone ER 10mg  # 60 q 12hrs faxed to [facility] on [address] (Fax [phone])  Electronic Signatures: Raynelle Fujikawa, Izora Gala (MD)  (Signed 13-Oct-15 17:31)  Authored: Palliative Care   Last Updated: 13-Oct-15 17:31 by Archie Shea, Izora Gala (MD)

## 2014-09-23 NOTE — Telephone Encounter (Signed)
error 

## 2014-09-29 DIAGNOSIS — F341 Dysthymic disorder: Secondary | ICD-10-CM | POA: Diagnosis not present

## 2014-09-29 DIAGNOSIS — G35 Multiple sclerosis: Secondary | ICD-10-CM | POA: Diagnosis not present

## 2014-09-29 DIAGNOSIS — I10 Essential (primary) hypertension: Secondary | ICD-10-CM | POA: Diagnosis not present

## 2014-09-29 DIAGNOSIS — N99511 Cystostomy infection: Secondary | ICD-10-CM | POA: Diagnosis not present

## 2014-09-29 DIAGNOSIS — Z435 Encounter for attention to cystostomy: Secondary | ICD-10-CM | POA: Diagnosis not present

## 2014-09-29 DIAGNOSIS — G894 Chronic pain syndrome: Secondary | ICD-10-CM | POA: Diagnosis not present

## 2014-09-29 DIAGNOSIS — K5732 Diverticulitis of large intestine without perforation or abscess without bleeding: Secondary | ICD-10-CM | POA: Diagnosis not present

## 2014-09-30 ENCOUNTER — Other Ambulatory Visit: Payer: Self-pay | Admitting: Internal Medicine

## 2014-10-01 ENCOUNTER — Other Ambulatory Visit: Payer: Self-pay | Admitting: Internal Medicine

## 2014-10-15 ENCOUNTER — Telehealth: Payer: Self-pay | Admitting: Neurology

## 2014-10-15 NOTE — Telephone Encounter (Signed)
I called patient. She is having some increase in stiffness in the legs, she indicates that she can wait until 11/06/2014, we will readjust the pump at that time.

## 2014-10-15 NOTE — Telephone Encounter (Signed)
I called the patient. She stated she has had more stiffness in her legs and wondered if the dose could be increased on her baclofen pump the next time she comes in. I promised her I would check with Dr. Jannifer Franklin and call her back.

## 2014-10-15 NOTE — Telephone Encounter (Signed)
Pt called and requested to speak with someone regarding her upcoming appt. She would like to know if it would be possible to increase the dosage on BACLOFEN IT the next time she comes in for a refill. Please call and advise.

## 2014-10-22 ENCOUNTER — Telehealth: Payer: Self-pay | Admitting: Internal Medicine

## 2014-10-22 MED ORDER — OXYCODONE HCL ER 10 MG PO T12A: 10.0000 mg | EXTENDED_RELEASE_TABLET | Freq: Three times a day (TID) | ORAL | Status: DC

## 2014-10-22 NOTE — Telephone Encounter (Signed)
Hospice called requesting refills for patient on Oxycontin and Percocet and advised to fax for hospice patient to [facility].

## 2014-10-22 NOTE — Telephone Encounter (Signed)
printed

## 2014-10-23 ENCOUNTER — Other Ambulatory Visit: Payer: Self-pay | Admitting: Internal Medicine

## 2014-10-23 MED ORDER — OXYCODONE-ACETAMINOPHEN 5-325 MG PO TABS: 1.0000 | ORAL_TABLET | Freq: Four times a day (QID) | ORAL | Status: DC | PRN

## 2014-10-23 NOTE — Telephone Encounter (Signed)
Scripts faxed as directed and hospice notified.

## 2014-10-30 DIAGNOSIS — Z435 Encounter for attention to cystostomy: Secondary | ICD-10-CM | POA: Diagnosis not present

## 2014-10-30 DIAGNOSIS — I10 Essential (primary) hypertension: Secondary | ICD-10-CM | POA: Diagnosis not present

## 2014-10-30 DIAGNOSIS — G35 Multiple sclerosis: Secondary | ICD-10-CM | POA: Diagnosis not present

## 2014-10-30 DIAGNOSIS — G894 Chronic pain syndrome: Secondary | ICD-10-CM | POA: Diagnosis not present

## 2014-10-30 DIAGNOSIS — N99511 Cystostomy infection: Secondary | ICD-10-CM | POA: Diagnosis not present

## 2014-10-30 DIAGNOSIS — F341 Dysthymic disorder: Secondary | ICD-10-CM | POA: Diagnosis not present

## 2014-10-30 DIAGNOSIS — K5732 Diverticulitis of large intestine without perforation or abscess without bleeding: Secondary | ICD-10-CM | POA: Diagnosis not present

## 2014-11-04 ENCOUNTER — Telehealth: Payer: Self-pay

## 2014-11-04 NOTE — Telephone Encounter (Signed)
Called to remind her of her pump refill and she wanted Dr. Jannifer Franklin to know she needs the amount increased...please advise

## 2014-11-04 NOTE — Telephone Encounter (Signed)
I called the patient. She stated that her legs are stiffening up quite a bit lately and she felt increasing the dose of Baclofen may help. I promised her I would let Dr. Jannifer Franklin know.

## 2014-11-05 ENCOUNTER — Other Ambulatory Visit: Payer: Self-pay | Admitting: *Deleted

## 2014-11-05 MED ORDER — OXYCODONE HCL ER 10 MG PO T12A: 10.0000 mg | EXTENDED_RELEASE_TABLET | Freq: Three times a day (TID) | ORAL | Status: DC

## 2014-11-05 NOTE — Telephone Encounter (Addendum)
Left message for Hospice nurse to return call to office. Needs to advise the use of OxyContin 90 pills in 2 weeks.

## 2014-11-05 NOTE — Telephone Encounter (Signed)
Hospice will only cover a 2 week supply at a time for patients so only 45 tablets was picked up from pharmacy and pharmacy will not hold the other 45 tablets to be picked up at later date. Script needs to be for 45 pills.

## 2014-11-05 NOTE — Telephone Encounter (Signed)
Teresa from Hospice called requesting Oxycontin 10 mg be faxed to [facility] on [address].  Please advise refill

## 2014-11-05 NOTE — Telephone Encounter (Signed)
Her oxycontin was just refilled May 24 th please call Hospice and find out how they went through 90 tablets in less than 2 weeks

## 2014-11-05 NOTE — Telephone Encounter (Signed)
Ok to refill,  printed rx  

## 2014-11-06 ENCOUNTER — Encounter: Payer: Self-pay | Admitting: Neurology

## 2014-11-06 ENCOUNTER — Ambulatory Visit (INDEPENDENT_AMBULATORY_CARE_PROVIDER_SITE_OTHER): Admitting: Neurology

## 2014-11-06 VITALS — BP 130/71 | HR 87

## 2014-11-06 DIAGNOSIS — G35 Multiple sclerosis: Secondary | ICD-10-CM

## 2014-11-06 DIAGNOSIS — G8250 Quadriplegia, unspecified: Secondary | ICD-10-CM

## 2014-11-06 MED ORDER — BACLOFEN 10000 MCG/20ML IT SOLN
10000.0000 ug | Freq: Once | INTRATHECAL | Status: AC
  Administered 2014-11-06: 10000 ug via INTRATHECAL

## 2014-11-06 NOTE — Telephone Encounter (Signed)
Script faxed to pharmacy as hospice patient.

## 2014-11-06 NOTE — Procedures (Signed)
     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 The patient reports a 6 month history of increased muscle tension in the hamstrings bilaterally. The patient continues to have episodes of pain that comes up from the legs to the rest of the body. The patient indicates that she continues to progress with the weakness in the arms, the right arm is worse than the left.  Baclofen pump refill note  The baclofen pump site was cleaned with Betadine solution. A 21-gauge needle was inserted into the pump port site. Approximately 4 cc of residual baclofen was removed, the pump indicates 3.1 mL. 20 cc of replacement baclofen was placed into the pump at 500 mcg/cc concentration.  The pump was reprogrammed for the following settings: 94.83 g per day, simple continuous rate. This is a 10% increase.  The alarm volume is set at 1.5 mL. The next alarm date is 02/11/2015.  The patient tolerated the procedure well. There were no complications of the above procedure.  The [place] number is [phone] The baclofen expiration date is August 2018. The baclofen lot number is [id number].

## 2014-11-06 NOTE — Progress Notes (Signed)
The baclofen refill procedure note.

## 2014-11-15 ENCOUNTER — Telehealth: Payer: Self-pay | Admitting: *Deleted

## 2014-11-15 MED ORDER — OXYCODONE HCL ER 10 MG PO T12A: 10.0000 mg | EXTENDED_RELEASE_TABLET | Freq: Three times a day (TID) | ORAL | Status: DC

## 2014-11-15 NOTE — Telephone Encounter (Signed)
Helene Kelp aware Rx to be faxed

## 2014-11-15 NOTE — Telephone Encounter (Signed)
Helene Kelp from hospice called requesting Oxycodone 10 [phone].  Please advise

## 2014-11-15 NOTE — Telephone Encounter (Signed)
Ok to refill,  printed rx  

## 2014-11-18 ENCOUNTER — Telehealth: Payer: Self-pay

## 2014-11-18 NOTE — Telephone Encounter (Signed)
Pt states she does not have mammograms anymore

## 2014-11-19 ENCOUNTER — Telehealth: Payer: Self-pay | Admitting: Internal Medicine

## 2014-11-19 MED ORDER — IPRATROPIUM-ALBUTEROL 20-100 MCG/ACT IN AERS: 1.0000 | INHALATION_SPRAY | Freq: Four times a day (QID) | RESPIRATORY_TRACT | Status: DC

## 2014-11-19 MED ORDER — OXYCODONE-ACETAMINOPHEN 5-325 MG PO TABS: 1.0000 | ORAL_TABLET | Freq: Four times a day (QID) | ORAL | Status: DC | PRN

## 2014-11-19 MED ORDER — OXYCODONE HCL ER 15 MG PO T12A: 15.0000 mg | EXTENDED_RELEASE_TABLET | Freq: Three times a day (TID) | ORAL | Status: DC

## 2014-11-19 NOTE — Telephone Encounter (Signed)
Ok to refill,  printed rx  

## 2014-11-19 NOTE — Telephone Encounter (Signed)
Still need refill on Percocet hospice will only do 2 week supply at a time. Only 60 received from pharmacy not 120 clarified with [facility] only 55 dispensed. Changed quantity to 60 on pended script.

## 2014-11-19 NOTE — Telephone Encounter (Signed)
Hospice nurse notified of medication change and of MDI, nurse acknowledged understanding. Also faxed scripts to pharmacy for hospice patient.

## 2014-11-19 NOTE — Telephone Encounter (Signed)
oxycontin increase to 15 mg every 8 hours   Combivent inhaler can be used every 4 to 6 hours as needed for productive cough,  But it   Might be cheaper to have Hospcie RN provide medication via nebulizer.  The MDI was sent to pharmacy

## 2014-11-19 NOTE — Telephone Encounter (Signed)
Hospice called triage on Friday requesting refill on percocet not OxyContin, also hospice nurse stated patient is having to take percocet at increased frequency, would like to know if dosage can be increased on OxyContin.  I have pended meds for approval and dosage change if appropriate. Patient would like to decrease the frequency in taking the percocet and increase the OxyContin.  Hospice nurse stated patient has an increase in mucus production and coughing but chest is clear to auscultation. Please advise.

## 2014-11-25 ENCOUNTER — Other Ambulatory Visit: Payer: Self-pay

## 2014-11-26 ENCOUNTER — Telehealth: Payer: Self-pay | Admitting: Internal Medicine

## 2014-11-26 MED ORDER — LEVOFLOXACIN 500 MG PO TABS: 500.0000 mg | ORAL_TABLET | Freq: Every day | ORAL | Status: DC

## 2014-11-26 NOTE — Telephone Encounter (Signed)
Rx for Levaquin, once daily sent to pharmacy

## 2014-11-26 NOTE — Telephone Encounter (Signed)
Advised nurse of new order. Nurse has another question for MD please advise can patient decrease gabapentin or DC dosing all together. Patient feels it is not helping/

## 2014-11-26 NOTE — Telephone Encounter (Signed)
Hospice nurse notified and voiced understanding.

## 2014-11-26 NOTE — Telephone Encounter (Signed)
Hospice nurse called patient is coughing up thick yellow mucus and having SOB 02 sat between 90-92 Patient has rales and crackles has been started on neb 4 x daily. Nurse would like an ABX please advise.

## 2014-11-26 NOTE — Telephone Encounter (Signed)
She is currently taking 600 mg three times daily so  She should reduce the dose before stopping completely. If her capsule is 600 mg, reduce dosing to twice daily for a week, then once daily for a week,  Then stop

## 2014-11-27 ENCOUNTER — Other Ambulatory Visit: Payer: Self-pay | Admitting: Internal Medicine

## 2014-11-29 DIAGNOSIS — N99511 Cystostomy infection: Secondary | ICD-10-CM | POA: Diagnosis not present

## 2014-11-29 DIAGNOSIS — I10 Essential (primary) hypertension: Secondary | ICD-10-CM | POA: Diagnosis not present

## 2014-11-29 DIAGNOSIS — F341 Dysthymic disorder: Secondary | ICD-10-CM | POA: Diagnosis not present

## 2014-11-29 DIAGNOSIS — G894 Chronic pain syndrome: Secondary | ICD-10-CM | POA: Diagnosis not present

## 2014-11-29 DIAGNOSIS — Z435 Encounter for attention to cystostomy: Secondary | ICD-10-CM | POA: Diagnosis not present

## 2014-11-29 DIAGNOSIS — G35 Multiple sclerosis: Secondary | ICD-10-CM | POA: Diagnosis not present

## 2014-11-29 DIAGNOSIS — K5732 Diverticulitis of large intestine without perforation or abscess without bleeding: Secondary | ICD-10-CM | POA: Diagnosis not present

## 2014-12-01 ENCOUNTER — Telehealth: Payer: Self-pay | Admitting: Family Medicine

## 2014-12-01 NOTE — Telephone Encounter (Signed)
Hospice reports RN noted thick secretions and requesting mucinex verbal- provided.

## 2014-12-03 ENCOUNTER — Telehealth: Payer: Self-pay | Admitting: *Deleted

## 2014-12-03 MED ORDER — OXYCODONE HCL ER 15 MG PO T12A: 15.0000 mg | EXTENDED_RELEASE_TABLET | Freq: Three times a day (TID) | ORAL | Status: DC

## 2014-12-03 NOTE — Telephone Encounter (Signed)
Ok to refill,  printed rx  

## 2014-12-03 NOTE — Telephone Encounter (Signed)
Angelica Wilcox from [facility] called requesting ref[facility] [address]  Pt is a HOSPICE PT.

## 2014-12-04 ENCOUNTER — Telehealth: Payer: Self-pay | Admitting: Internal Medicine

## 2014-12-04 NOTE — Telephone Encounter (Signed)
[facility] needs clarification of rx.

## 2014-12-04 NOTE — Telephone Encounter (Signed)
Faxed to Pharmacy per request.

## 2014-12-04 NOTE — Telephone Encounter (Signed)
Called back and clarified.

## 2014-12-05 ENCOUNTER — Telehealth: Payer: Self-pay | Admitting: Internal Medicine

## 2014-12-05 ENCOUNTER — Telehealth: Payer: Self-pay | Admitting: *Deleted

## 2014-12-05 MED ORDER — OXYCONTIN 20 MG PO T12A: 20.0000 mg | EXTENDED_RELEASE_TABLET | Freq: Two times a day (BID) | ORAL | Status: DC

## 2014-12-05 NOTE — Telephone Encounter (Signed)
Hospice RN called requesting Oxycontin 20 mg BID be refilled.  States Dr Marco Collie Medical Director of Hospice last prescribed this dosage.  [facility] [address] Pt is Hospice Pt.  Please advise refill.

## 2014-12-06 NOTE — Telephone Encounter (Signed)
rx faxed

## 2014-12-06 NOTE — Telephone Encounter (Signed)
Ok to refill,  printed rx  

## 2014-12-13 ENCOUNTER — Other Ambulatory Visit: Payer: Self-pay | Admitting: *Deleted

## 2014-12-13 ENCOUNTER — Telehealth: Payer: Self-pay | Admitting: *Deleted

## 2014-12-13 MED ORDER — OXYCODONE-ACETAMINOPHEN 5-325 MG PO TABS: 1.0000 | ORAL_TABLET | Freq: Four times a day (QID) | ORAL | Status: DC | PRN

## 2014-12-13 NOTE — Telephone Encounter (Signed)
Fine to fill Rx for hospice pt

## 2014-12-13 NOTE — Telephone Encounter (Signed)
Angelica Wilcox from [facility] called requesting Oxycodone 5-325 mg 1 tab every 6 hours #60 [facility] [phone].  Hospice pt.  Please advise in Dr Lupita Dawn absence.

## 2014-12-13 NOTE — Telephone Encounter (Signed)
Spoke with Helene Kelp that Rx faxed.

## 2014-12-20 ENCOUNTER — Other Ambulatory Visit: Payer: Self-pay | Admitting: *Deleted

## 2014-12-20 ENCOUNTER — Telehealth: Payer: Self-pay | Admitting: *Deleted

## 2014-12-20 MED ORDER — OXYCONTIN 20 MG PO T12A: 20.0000 mg | EXTENDED_RELEASE_TABLET | Freq: Two times a day (BID) | ORAL | Status: DC

## 2014-12-20 NOTE — Telephone Encounter (Signed)
Helene Kelp from hospice called requesting Oxycontin 20mg  BID # 30 [facility] [address]  Pt is a Hospice pt.  Please advise refill in Dr Lupita Dawn absence

## 2014-12-20 NOTE — Telephone Encounter (Signed)
Fine to refill 

## 2014-12-20 NOTE — Telephone Encounter (Signed)
Rx faxed

## 2014-12-24 ENCOUNTER — Telehealth: Payer: Self-pay | Admitting: *Deleted

## 2014-12-24 MED ORDER — OXYCODONE HCL ER 30 MG PO T12A: 1.0000 | EXTENDED_RELEASE_TABLET | Freq: Two times a day (BID) | ORAL | Status: DC | PRN

## 2014-12-24 NOTE — Telephone Encounter (Signed)
Angelica Wilcox called reports pt is having increased pain with Oxycontin 20mg  BID alone.  Has began taking Oxycodone and liquid morphine to get comfortable along with the Oxycontin.  Requesting an increase of Oxycontin to 30 mg BID.  [facility] pt[facility] [address]  Please advise

## 2014-12-24 NOTE — Telephone Encounter (Signed)
DOSE INCREASED AND RX PRINTED

## 2014-12-24 NOTE — Telephone Encounter (Signed)
Spoke with Helene Kelp, advised Rx to be faxed after 1pm.  Verbalized understanding

## 2014-12-30 DIAGNOSIS — F341 Dysthymic disorder: Secondary | ICD-10-CM | POA: Diagnosis not present

## 2014-12-30 DIAGNOSIS — I10 Essential (primary) hypertension: Secondary | ICD-10-CM | POA: Diagnosis not present

## 2014-12-30 DIAGNOSIS — G894 Chronic pain syndrome: Secondary | ICD-10-CM | POA: Diagnosis not present

## 2014-12-30 DIAGNOSIS — G35 Multiple sclerosis: Secondary | ICD-10-CM | POA: Diagnosis not present

## 2014-12-30 DIAGNOSIS — K5732 Diverticulitis of large intestine without perforation or abscess without bleeding: Secondary | ICD-10-CM | POA: Diagnosis not present

## 2014-12-30 DIAGNOSIS — Z435 Encounter for attention to cystostomy: Secondary | ICD-10-CM | POA: Diagnosis not present

## 2014-12-30 DIAGNOSIS — N99511 Cystostomy infection: Secondary | ICD-10-CM | POA: Diagnosis not present

## 2015-01-07 ENCOUNTER — Telehealth: Payer: Self-pay | Admitting: *Deleted

## 2015-01-07 MED ORDER — OXYCODONE HCL ER 30 MG PO T12A: 1.0000 | EXTENDED_RELEASE_TABLET | Freq: Two times a day (BID) | ORAL | Status: DC | PRN

## 2015-01-07 MED ORDER — OXYCODONE-ACETAMINOPHEN 5-325 MG PO TABS: 1.0000 | ORAL_TABLET | Freq: Four times a day (QID) | ORAL | Status: DC | PRN

## 2015-01-07 NOTE — Telephone Encounter (Signed)
Ok to refill,  Authorized in epic and printed  

## 2015-01-07 NOTE — Telephone Encounter (Signed)
Helene Kelp from [facility] called requesting Oxycontin 30 mg #30 and Oxydodone 5/325 mg #60.  Please advise

## 2015-01-07 NOTE — Telephone Encounter (Signed)
Rx faxed, Helene Kelp aware

## 2015-01-17 ENCOUNTER — Telehealth: Payer: Self-pay | Admitting: Internal Medicine

## 2015-01-17 ENCOUNTER — Other Ambulatory Visit: Payer: Self-pay

## 2015-01-17 MED ORDER — AMOXICILLIN 500 MG PO CAPS: 500.0000 mg | ORAL_CAPSULE | Freq: Two times a day (BID) | ORAL | Status: DC

## 2015-01-17 NOTE — Telephone Encounter (Signed)
We can start Amoxicillin 500mg  po bid #14

## 2015-01-17 NOTE — Telephone Encounter (Signed)
Called Crystal to give results, prescription sent to the pharmacy.

## 2015-01-17 NOTE — Telephone Encounter (Signed)
Spoke with Crystal to confirm, patient has been taking OTC Mucinex daily and utilizing her duonebs.  Drainage is yellow/green and has ronchi throughout lung fields.  Please advise in Dr. Lupita Dawn absence if a anitibiotic can be called in.  Allergy to Cipro.

## 2015-01-17 NOTE — Telephone Encounter (Signed)
Crystal from Hospice [phone] called in regards to pt having a congestion cough (yellow) lung sound has wheezing and crackles and different from first visit this week. She is taking mucinex and duoneb inhalers. She wants to know if a anitobotic can be prescribed. Allergic to Cipro. [facility] [address]. Thank You!

## 2015-01-17 NOTE — Telephone Encounter (Signed)
See telephone note for details 

## 2015-01-27 ENCOUNTER — Telehealth: Payer: Self-pay | Admitting: Internal Medicine

## 2015-01-27 MED ORDER — OXYCODONE HCL ER 30 MG PO T12A: 1.0000 | EXTENDED_RELEASE_TABLET | Freq: Two times a day (BID) | ORAL | Status: DC | PRN

## 2015-01-27 NOTE — Telephone Encounter (Signed)
Hospice called requesting refill on OxyContin 30 mg 1 BID Number 30 ok to fill?

## 2015-01-27 NOTE — Telephone Encounter (Signed)
Ok to refill,  printed rx  

## 2015-01-27 NOTE — Telephone Encounter (Signed)
Script faxed to pharmacy. Left hospice detailed message on voicemail.

## 2015-01-30 DIAGNOSIS — K5732 Diverticulitis of large intestine without perforation or abscess without bleeding: Secondary | ICD-10-CM | POA: Diagnosis not present

## 2015-01-30 DIAGNOSIS — Z435 Encounter for attention to cystostomy: Secondary | ICD-10-CM | POA: Diagnosis not present

## 2015-01-30 DIAGNOSIS — G35 Multiple sclerosis: Secondary | ICD-10-CM | POA: Diagnosis not present

## 2015-01-30 DIAGNOSIS — N99511 Cystostomy infection: Secondary | ICD-10-CM | POA: Diagnosis not present

## 2015-01-30 DIAGNOSIS — G894 Chronic pain syndrome: Secondary | ICD-10-CM | POA: Diagnosis not present

## 2015-01-30 DIAGNOSIS — F341 Dysthymic disorder: Secondary | ICD-10-CM | POA: Diagnosis not present

## 2015-01-30 DIAGNOSIS — I10 Essential (primary) hypertension: Secondary | ICD-10-CM | POA: Diagnosis not present

## 2015-02-01 ENCOUNTER — Other Ambulatory Visit: Payer: Self-pay | Admitting: Internal Medicine

## 2015-02-04 ENCOUNTER — Telehealth: Payer: Self-pay | Admitting: *Deleted

## 2015-02-04 MED ORDER — OXYCODONE-ACETAMINOPHEN 5-325 MG PO TABS: 1.0000 | ORAL_TABLET | Freq: Four times a day (QID) | ORAL | Status: DC | PRN

## 2015-02-04 NOTE — Telephone Encounter (Signed)
Ok to refill,  printed rx  

## 2015-02-04 NOTE — Telephone Encounter (Signed)
rx faxed

## 2015-02-04 NOTE — Telephone Encounter (Signed)
Angelica Wilcox from [facility] called requesting Oxycodone 5/325 mg 1-2 tabs every 6 hours PRN.  [facility] [address]

## 2015-02-13 ENCOUNTER — Telehealth: Payer: Self-pay | Admitting: Internal Medicine

## 2015-02-13 MED ORDER — OXYCODONE HCL ER 30 MG PO T12A: 1.0000 | EXTENDED_RELEASE_TABLET | Freq: Two times a day (BID) | ORAL | Status: DC | PRN

## 2015-02-13 NOTE — Telephone Encounter (Signed)
Ok to refill,  printed rx  

## 2015-02-13 NOTE — Telephone Encounter (Signed)
Crystal [phone] calling from Hospice regarding pt oxycontin 30 mg twice daily has ran out. It needs to say Hospice hospice pt. [facility] [address]

## 2015-02-13 NOTE — Telephone Encounter (Signed)
Rx faxed, Crystal aware

## 2015-02-17 ENCOUNTER — Telehealth: Payer: Self-pay | Admitting: Neurology

## 2015-02-17 NOTE — Telephone Encounter (Signed)
Patient called stating the alarm on pump for  BACLOFEN IT went off this weekend. She is requesting a refill please call and advise. Patient can be reached at [phone].

## 2015-02-17 NOTE — Telephone Encounter (Signed)
I called the patient. I apologized for not having an appointment scheduled before her alarm went off. She will come in tomorrow (9/20) at 11:30.

## 2015-02-18 ENCOUNTER — Ambulatory Visit (INDEPENDENT_AMBULATORY_CARE_PROVIDER_SITE_OTHER): Admitting: Neurology

## 2015-02-18 VITALS — BP 107/63 | HR 82

## 2015-02-18 DIAGNOSIS — G35 Multiple sclerosis: Secondary | ICD-10-CM | POA: Diagnosis not present

## 2015-02-18 MED ORDER — BACLOFEN 10000 MCG/20ML IT SOLN
10000.0000 ug | Freq: Once | INTRATHECAL | Status: AC
  Administered 2015-02-18: 10000 ug via INTRATHECAL

## 2015-02-18 NOTE — Procedures (Signed)
     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 The patient comes into the office for a baclofen pump refill. She continues to have ongoing progression of weakness of the arms and hands bilaterally, right greater than left. She notes an increase in stiffness of the legs. She continues to have nerve have pain that is primarily in the legs.  Baclofen pump refill note  The baclofen pump site was cleaned with Betadine solution. A 21-gauge needle was inserted into the pump port site. Approximately 1.0 cc of residual baclofen was removed, the pump indicates 0.3 mL residual. The pump alarm came on 3 days ago.. 20 cc of replacement baclofen was placed into the pump at 500 mcg/cc concentration.  The pump was reprogrammed for the following settings: Simple continuous rate, going from 94.83 g per day to a rate of 100.07 g per day, this represents a 6% increase.  The alarm volume is set at 1.5 cc. The next alarm date is 05/21/2015.  The patient tolerated the procedure well. There were no complications of the above procedure.  The [place] number is [phone] The baclofen expiration date is August 2018. The baclofen lot number is [id number].

## 2015-02-25 ENCOUNTER — Telehealth: Payer: Self-pay | Admitting: Internal Medicine

## 2015-02-25 MED ORDER — OXYCODONE HCL ER 30 MG PO T12A: 1.0000 | EXTENDED_RELEASE_TABLET | Freq: Two times a day (BID) | ORAL | Status: DC | PRN

## 2015-02-25 NOTE — Telephone Encounter (Signed)
Pt needs a  Refill of OxyCODONE HCl ER (OXYCONTIN) 30 MG to [facility] [address].[place]

## 2015-02-25 NOTE — Telephone Encounter (Signed)
Ok to refill,  printed rx  

## 2015-02-25 NOTE — Telephone Encounter (Signed)
Pt called and told rx was at the office for her to pick up.

## 2015-02-27 ENCOUNTER — Encounter: Payer: Self-pay | Admitting: Neurology

## 2015-03-01 DIAGNOSIS — I10 Essential (primary) hypertension: Secondary | ICD-10-CM | POA: Diagnosis not present

## 2015-03-01 DIAGNOSIS — G894 Chronic pain syndrome: Secondary | ICD-10-CM | POA: Diagnosis not present

## 2015-03-01 DIAGNOSIS — F341 Dysthymic disorder: Secondary | ICD-10-CM | POA: Diagnosis not present

## 2015-03-01 DIAGNOSIS — K5732 Diverticulitis of large intestine without perforation or abscess without bleeding: Secondary | ICD-10-CM | POA: Diagnosis not present

## 2015-03-01 DIAGNOSIS — G35 Multiple sclerosis: Secondary | ICD-10-CM | POA: Diagnosis not present

## 2015-03-01 DIAGNOSIS — N99511 Cystostomy infection: Secondary | ICD-10-CM | POA: Diagnosis not present

## 2015-03-01 DIAGNOSIS — Z435 Encounter for attention to cystostomy: Secondary | ICD-10-CM | POA: Diagnosis not present

## 2015-03-04 ENCOUNTER — Telehealth: Payer: Self-pay | Admitting: Neurology

## 2015-03-04 NOTE — Telephone Encounter (Signed)
[place] , called and they would like the Baclofen medication dosage, pt was seen 9/20 for pump refill.  [phone].

## 2015-03-04 NOTE — Telephone Encounter (Signed)
I called Angelica Wilcox. Dr. Jannifer Franklin used 20 mL of Baclofen at 500 mcg/mL concentration. He increased the rate 6% from 94.83 mcg/day to 100.07 mcg/day.

## 2015-03-08 ENCOUNTER — Other Ambulatory Visit: Payer: Self-pay | Admitting: Internal Medicine

## 2015-03-10 NOTE — Telephone Encounter (Signed)
CYMBALTA Ok to refill,  Refill sent FOR 30 NOT 15 (NOT SURE WHY ONLY 15 WAS REQUESTED)

## 2015-03-10 NOTE — Telephone Encounter (Signed)
Please advise last OV was 07/26/14.

## 2015-03-18 ENCOUNTER — Other Ambulatory Visit: Payer: Self-pay

## 2015-03-18 ENCOUNTER — Telehealth: Payer: Self-pay | Admitting: *Deleted

## 2015-03-18 NOTE — Telephone Encounter (Signed)
Patient requested a medication refill for OxyContin, [facility] [address].

## 2015-03-18 NOTE — Telephone Encounter (Signed)
Please advise refill, last seen on 07/26/14, but hospice patient?

## 2015-03-18 NOTE — Telephone Encounter (Signed)
Sent message to Dr. Derrel Nip for review.

## 2015-03-19 MED ORDER — OXYCODONE HCL ER 30 MG PO T12A: 1.0000 | EXTENDED_RELEASE_TABLET | Freq: Two times a day (BID) | ORAL | Status: DC | PRN

## 2015-03-19 NOTE — Telephone Encounter (Signed)
printed

## 2015-03-22 ENCOUNTER — Other Ambulatory Visit: Payer: Self-pay | Admitting: Internal Medicine

## 2015-03-24 NOTE — Telephone Encounter (Signed)
Ok to refill,  Refill sent for #60

## 2015-03-24 NOTE — Telephone Encounter (Signed)
Last OV 2.26.16, no OV scheduled. Please advise refill

## 2015-03-25 ENCOUNTER — Telehealth: Payer: Self-pay | Admitting: *Deleted

## 2015-03-25 MED ORDER — OXYCODONE HCL ER 30 MG PO T12A: 1.0000 | EXTENDED_RELEASE_TABLET | Freq: Two times a day (BID) | ORAL | Status: DC | PRN

## 2015-03-25 NOTE — Telephone Encounter (Signed)
Angelica Wilcox,   Thanks for your assistance.

## 2015-03-25 NOTE — Telephone Encounter (Signed)
Patient Need a Rx written for her oxycodone , 30tablets

## 2015-03-25 NOTE — Telephone Encounter (Signed)
Hospice will only cover 15 days supply at a time I have reprinted for 15 day supply.

## 2015-04-01 DIAGNOSIS — K5732 Diverticulitis of large intestine without perforation or abscess without bleeding: Secondary | ICD-10-CM | POA: Diagnosis not present

## 2015-04-01 DIAGNOSIS — G35 Multiple sclerosis: Secondary | ICD-10-CM | POA: Diagnosis not present

## 2015-04-01 DIAGNOSIS — G894 Chronic pain syndrome: Secondary | ICD-10-CM | POA: Diagnosis not present

## 2015-04-01 DIAGNOSIS — Z435 Encounter for attention to cystostomy: Secondary | ICD-10-CM | POA: Diagnosis not present

## 2015-04-01 DIAGNOSIS — F341 Dysthymic disorder: Secondary | ICD-10-CM | POA: Diagnosis not present

## 2015-04-01 DIAGNOSIS — I10 Essential (primary) hypertension: Secondary | ICD-10-CM | POA: Diagnosis not present

## 2015-04-01 DIAGNOSIS — N99511 Cystostomy infection: Secondary | ICD-10-CM | POA: Diagnosis not present

## 2015-04-11 ENCOUNTER — Telehealth: Payer: Self-pay | Admitting: Internal Medicine

## 2015-04-11 MED ORDER — OXYCODONE HCL ER 30 MG PO T12A: 1.0000 | EXTENDED_RELEASE_TABLET | Freq: Two times a day (BID) | ORAL | Status: DC | PRN

## 2015-04-11 NOTE — Telephone Encounter (Signed)
Ok to refill,  printed rx DEND TO [place]

## 2015-04-11 NOTE — Telephone Encounter (Signed)
Due to phones being out, [facility] [address] by [facility].

## 2015-04-11 NOTE — Telephone Encounter (Signed)
Helene Kelp, the hospice nurse called and says pt will be out of pain meds in 4 days.  She needs Oxycotin 30 mg, 1 tab 2x daily refilled for total of 30 tablets. [facility] [address]  Phones are down so I took this for you and pt.  Teresa's # is [phone]. Thank you.

## 2015-04-29 ENCOUNTER — Other Ambulatory Visit: Payer: Self-pay

## 2015-04-29 MED ORDER — OXYCODONE-ACETAMINOPHEN 5-325 MG PO TABS: 1.0000 | ORAL_TABLET | Freq: Four times a day (QID) | ORAL | Status: DC | PRN

## 2015-04-29 MED ORDER — OXYCODONE HCL ER 30 MG PO T12A: 1.0000 | EXTENDED_RELEASE_TABLET | Freq: Two times a day (BID) | ORAL | Status: DC | PRN

## 2015-04-29 NOTE — Telephone Encounter (Signed)
Pt is requesting Oxycodone and Oxycontin

## 2015-04-29 NOTE — Telephone Encounter (Signed)
rx for both printed.  Hospice patient . Given to [facility]

## 2015-04-30 NOTE — Telephone Encounter (Signed)
Angelica Wilcox from [facility] stated that pharmacy has not received Rx. Angelica Wilcox requested a call once Rx were faxed.  [phone]

## 2015-04-30 NOTE — Telephone Encounter (Signed)
Rx faxed again and [facility] notified.

## 2015-04-30 NOTE — Telephone Encounter (Signed)
Script faxed as requested.

## 2015-05-01 DIAGNOSIS — I10 Essential (primary) hypertension: Secondary | ICD-10-CM | POA: Diagnosis not present

## 2015-05-01 DIAGNOSIS — G35 Multiple sclerosis: Secondary | ICD-10-CM | POA: Diagnosis not present

## 2015-05-01 DIAGNOSIS — Z435 Encounter for attention to cystostomy: Secondary | ICD-10-CM | POA: Diagnosis not present

## 2015-05-01 DIAGNOSIS — K5732 Diverticulitis of large intestine without perforation or abscess without bleeding: Secondary | ICD-10-CM | POA: Diagnosis not present

## 2015-05-01 DIAGNOSIS — F341 Dysthymic disorder: Secondary | ICD-10-CM | POA: Diagnosis not present

## 2015-05-01 DIAGNOSIS — N99511 Cystostomy infection: Secondary | ICD-10-CM | POA: Diagnosis not present

## 2015-05-01 DIAGNOSIS — G894 Chronic pain syndrome: Secondary | ICD-10-CM | POA: Diagnosis not present

## 2015-05-03 ENCOUNTER — Other Ambulatory Visit: Payer: Self-pay | Admitting: Internal Medicine

## 2015-05-05 NOTE — Telephone Encounter (Signed)
Last OV was 07/26/14, Please advise refill?

## 2015-05-07 NOTE — Telephone Encounter (Signed)
90 day supply authorized and sent   

## 2015-05-10 ENCOUNTER — Other Ambulatory Visit: Payer: Self-pay | Admitting: Internal Medicine

## 2015-05-12 NOTE — Telephone Encounter (Signed)
Last office visit was 07/2014, please advise?

## 2015-05-13 ENCOUNTER — Telehealth: Payer: Self-pay | Admitting: Internal Medicine

## 2015-05-13 ENCOUNTER — Other Ambulatory Visit: Payer: Self-pay

## 2015-05-13 MED ORDER — OXYCODONE HCL ER 30 MG PO T12A: 1.0000 | EXTENDED_RELEASE_TABLET | Freq: Two times a day (BID) | ORAL | Status: DC | PRN

## 2015-05-13 NOTE — Telephone Encounter (Signed)
Tonya, Is your msg for me?

## 2015-05-13 NOTE — Telephone Encounter (Signed)
Angelica Wilcox [phone] called from [facility] regarding pt needing refill on oxyCODONE (OXYCONTIN) 30 MG 12 hr tablet. [facility] [place] [place]. Thank You!

## 2015-05-13 NOTE — Telephone Encounter (Signed)
Put in Providers quick sign folder to complete.

## 2015-05-13 NOTE — Telephone Encounter (Signed)
Ok to refill,  Authorized in [place] . PLEASE SCHEDULE A FOLLOW UP 30 MINUTES NEEDED.  WHEELCHAIR BOUND

## 2015-05-13 NOTE — Telephone Encounter (Signed)
Nope it's just a paper trail for the prescription, I will get it from [place], thanks

## 2015-05-19 ENCOUNTER — Ambulatory Visit (INDEPENDENT_AMBULATORY_CARE_PROVIDER_SITE_OTHER): Admitting: Neurology

## 2015-05-19 ENCOUNTER — Encounter: Payer: Self-pay | Admitting: Neurology

## 2015-05-19 VITALS — BP 122/72 | HR 80

## 2015-05-19 DIAGNOSIS — G35 Multiple sclerosis: Secondary | ICD-10-CM | POA: Diagnosis not present

## 2015-05-19 DIAGNOSIS — G8250 Quadriplegia, unspecified: Secondary | ICD-10-CM | POA: Diagnosis not present

## 2015-05-19 MED ORDER — BACLOFEN 10000 MCG/20ML IT SOLN
10000.0000 ug | Freq: Once | INTRATHECAL | Status: AC
  Administered 2015-05-19: 10000 ug via INTRATHECAL

## 2015-05-19 NOTE — Progress Notes (Signed)
Please refer to baclofen pump note. 

## 2015-05-19 NOTE — Procedures (Signed)
     History:  Angelica Wilcox is a 69 year old patient with a history of chronic progressive multiple sclerosis with a quadriparesis. The patient has significant spasticity with both lower extremities. She returns for a baclofen pump refill. She was increased on the pump rate when last seen, the patient believes that this did offer some improvement. She returns for a baclofen pump refill.  Baclofen pump refill note  The baclofen pump site was cleaned with Betadine solution. A 21-gauge needle was inserted into the pump port site. Approximately 3 cc of residual baclofen was removed. The pump indicates a 2.0 cc residual. 20 cc of replacement baclofen was placed into the pump at 500 mcg/cc concentration.  The pump was reprogrammed for the following settings:  Simple continuous right was maintained at 100.07 g per day.  The alarm volume is set at  1.5 cc. The next alarm date is  08/19/2015.  The patient tolerated the procedure well. There were no complications of the above procedure.  The [place] number is [phone] The baclofen expiration date is  August 2018. The baclofen lot number is  [id number].

## 2015-05-20 ENCOUNTER — Telehealth: Payer: Self-pay | Admitting: *Deleted

## 2015-05-20 NOTE — Telephone Encounter (Signed)
Patient has a productive cough/with crackle sounds in lung. Agency requested something to be called in for the cough.Please Advise

## 2015-05-20 NOTE — Telephone Encounter (Signed)
Ok. Thanks!

## 2015-05-21 MED ORDER — PREDNISONE 10 MG PO TABS: ORAL_TABLET | ORAL | Status: DC

## 2015-05-21 MED ORDER — BENZONATATE 100 MG PO CAPS: 100.0000 mg | ORAL_CAPSULE | Freq: Three times a day (TID) | ORAL | Status: DC | PRN

## 2015-05-21 NOTE — Telephone Encounter (Signed)
Tessalon perles sent to pharmacy for cough suppression ,  Along with a prednisone taper.  Thank you for the excellent detail!!!!

## 2015-05-21 NOTE — Telephone Encounter (Signed)
Called and spoke with the paitent she has used her Nebulizer for two days, productive cough and was getting up sputum.  Today it is Non productive cough, still using the nebulizer.   Per patient her vitals yesterday was normal when the home health nurse was there.  She has been taking all her medications with no difficulty and urinating with no issues.  States she takes a fluid pill at night.   Per the patient she would rather not take any other medications, she thinks she can handle it.  Please advise.

## 2015-05-21 NOTE — Telephone Encounter (Signed)
Left a detailed message for the patient.  

## 2015-05-28 ENCOUNTER — Telehealth: Payer: Self-pay | Admitting: *Deleted

## 2015-05-28 MED ORDER — OXYCODONE HCL ER 30 MG PO T12A: 1.0000 | EXTENDED_RELEASE_TABLET | Freq: Two times a day (BID) | ORAL | Status: DC | PRN

## 2015-05-28 MED ORDER — AMOXICILLIN-POT CLAVULANATE 875-125 MG PO TABS: 1.0000 | ORAL_TABLET | Freq: Two times a day (BID) | ORAL | Status: DC

## 2015-05-28 NOTE — Telephone Encounter (Signed)
Spoke with Helene Kelp at [facility].  The patient needs a refill on the Oxycontin 30mg  twice a day, usually a 15 day supply at a time.  Please advise.  Patient has enough pills for the morning, but will need a afternoon dose for tomorrow.  Thanks

## 2015-05-28 NOTE — Telephone Encounter (Signed)
Angelica Wilcox from [facility] wanted to [facility] Dr. Derrel Nip, that patient continues to have a cough, with green mucus. Patient was prescribed prednisone taper. Angelica Wilcox also requested a Rx refill for oxycodone.

## 2015-05-28 NOTE — Telephone Encounter (Signed)
generic augmentin sent since she cannot  travel easily .Please take a probiotic ( Align, [place]ora[place] or Culturelle) for 2 weeks if you start the antibiotic to prevent a serious antibiotic associated diarrhea  Called" clostridium dificile colitis" ( should also help prevent   vaginal yeast infection)     Will need a follow up appt in one week    please clarify which oxycodone she  is requesting refill on

## 2015-05-28 NOTE — Telephone Encounter (Signed)
PRINTED

## 2015-05-28 NOTE — Telephone Encounter (Signed)
Please advise 

## 2015-06-01 DIAGNOSIS — F341 Dysthymic disorder: Secondary | ICD-10-CM | POA: Diagnosis not present

## 2015-06-01 DIAGNOSIS — G894 Chronic pain syndrome: Secondary | ICD-10-CM | POA: Diagnosis not present

## 2015-06-01 DIAGNOSIS — Z435 Encounter for attention to cystostomy: Secondary | ICD-10-CM | POA: Diagnosis not present

## 2015-06-01 DIAGNOSIS — N99511 Cystostomy infection: Secondary | ICD-10-CM | POA: Diagnosis not present

## 2015-06-01 DIAGNOSIS — G35 Multiple sclerosis: Secondary | ICD-10-CM | POA: Diagnosis not present

## 2015-06-01 DIAGNOSIS — I10 Essential (primary) hypertension: Secondary | ICD-10-CM | POA: Diagnosis not present

## 2015-06-01 DIAGNOSIS — K5732 Diverticulitis of large intestine without perforation or abscess without bleeding: Secondary | ICD-10-CM | POA: Diagnosis not present

## 2015-06-12 ENCOUNTER — Ambulatory Visit (INDEPENDENT_AMBULATORY_CARE_PROVIDER_SITE_OTHER): Admitting: Internal Medicine

## 2015-06-12 ENCOUNTER — Encounter: Payer: Self-pay | Admitting: Internal Medicine

## 2015-06-12 VITALS — BP 110/66 | HR 78 | Temp 98.2°F

## 2015-06-12 DIAGNOSIS — Z515 Encounter for palliative care: Secondary | ICD-10-CM | POA: Diagnosis not present

## 2015-06-12 DIAGNOSIS — G35 Multiple sclerosis: Secondary | ICD-10-CM | POA: Diagnosis not present

## 2015-06-12 DIAGNOSIS — Z23 Encounter for immunization: Secondary | ICD-10-CM | POA: Diagnosis not present

## 2015-06-12 MED ORDER — OXYCODONE HCL ER 30 MG PO T12A: 1.0000 | EXTENDED_RELEASE_TABLET | Freq: Two times a day (BID) | ORAL | Status: DC | PRN

## 2015-06-12 NOTE — Progress Notes (Signed)
Subjective:  Patient ID: Angelica Wilcox, female    DOB: 16-Feb-1946  Age: 70 y.o. MRN: [id number]  CC: The primary encounter diagnosis was Need for influenza vaccination. Diagnoses of Need for 23-polyvalent pneumococcal polysaccharide vaccine and Encounter for end of life care were also pertinent to this visit.  HPI Angelica Wilcox presents for follow up on physical decline secondary to MS and to reviewed her end of life wishes via MOST form.    She has been having recurrent constipation which interferes with bladder empyting . The episodes occur several times per week  .  Had obstipaiton lasting one week.  The constipation is managed with miralax which helped.. However, she has not tolerated daily miralax because it causes her stools to be  too soft,  Even 1/2 dose daily. Discussed  Doesn't want diverting colostomy  She has been using a suppository called the "magic bullet"   She is losing the strength and dexterity in her left hand.    She is handling the loss of function by taking it  "day by day."  She is also having some memory issues, short term memory loss .    She feels that the medications may be partly responsible Feeling physically exhausted with very little provocation .    She is concerned about enlarging spots on her back  Sk, reassurance provided.  Hospice providing assistance    Outpatient Prescriptions Prior to Visit  Medication Sig Dispense Refill  . BACLOFEN IT 65.07 mcg model No. [id number] implant date 09/22/11 At [facility]    . Cholecalciferol (VITAMIN D3) 2000 UNITS TABS Take 1 tablet by mouth daily.     . DULoxetine (CYMBALTA) 60 MG capsule TAKE 1 CAPSULE BY MOUTH AT BEDTIME 30 capsule 2  . furosemide (LASIX) 20 MG tablet TAKE 1 TABLET (20 MG TOTAL) BY MOUTH DAILY. 30 tablet 5  . gabapentin (NEURONTIN) 600 MG tablet TAKE 1 TABLET BY MOUTH TWICE A DAY 60 tablet 6  . Infant Care Products (DERMACLOUD) CREA Apply to affected areas 2-3 times a day. 430 g 2  . lidocaine  (XYLOCAINE) 2 % jelly APPLY TO AFFECTED AREA AS NEEDED  6  . lisinopril (PRINIVIL,ZESTRIL) 20 MG tablet TAKE 1 TABLET BY MOUTH EVERY DAY 90 tablet 1  . LORazepam (ATIVAN) 0.5 MG tablet Take 1 tablet (0.5 mg total) by mouth daily as needed. 30 tablet 3  . Misc. Devices [facility]) MISC 1 each by Other route.    . NON FORMULARY 500 mcg by Other route.    . nystatin cream (MYCOSTATIN) APPLY TO AFFECTED AREA TWICE A DAY AS NEEDED  0  . OnabotulinumtoxinA (BOTOX IJ) Inject 300 Units as directed.    Marland Kitchen oxyCODONE-acetaminophen (PERCOCET/ROXICET) 5-325 MG tablet Take 1-2 tablets by mouth every 6 (six) hours as needed for severe pain. 60 tablet 0  . predniSONE (DELTASONE) 10 MG tablet 6 tablets on Day 1 , then reduce by 1 tablet daily until gone 21 tablet 0  . oxyCODONE (OXYCONTIN) 30 MG 12 hr tablet Take 1 tablet by mouth every 12 (twelve) hours as needed. HOSPICE PATIENT 30 each 0  . amoxicillin (AMOXIL) 500 MG capsule Take 1 capsule (500 mg total) by mouth 2 (two) times daily. 28 capsule 0  . amoxicillin-clavulanate (AUGMENTIN) 875-125 MG tablet Take 1 tablet by mouth 2 (two) times daily. 14 tablet 0  . benzonatate (TESSALON) 100 MG capsule Take 1 capsule (100 mg total) by mouth 3 (three) times daily as needed for cough.  60 capsule 0   No facility-administered medications prior to visit.    Review of Systems;  Patient denies headache, fevers, malaise, unintentional weight loss, skin rash, eye pain, sinus congestion and sinus pain, sore throat, dysphagia,  hemoptysis , cough, dyspnea, wheezing, chest pain, palpitations, orthopnea, edema, abdominal pain, nausea, melena, diarrhea, constipation, flank pain, dysuria, hematuria, urinary  Frequency, nocturia, numbness, tingling, seizures,  Focal weakness, Loss of consciousness,  Tremor, insomnia, depression, anxiety, and suicidal ideation.      Objective:  BP 110/66 mmHg  Pulse 78  Temp(Src) 98.2 F (36.8 C) (Oral)  Wt   SpO2 94%  BP Readings from  Last 3 Encounters:  06/12/15 110/66  05/19/15 122/72  02/18/15 107/63    Wt Readings from Last 3 Encounters:  01/01/14 150 lb (68.04 kg)  05/30/13 150 lb (68.04 kg)  05/11/13 150 lb (68.04 kg)    General appearance: alert, cooperative and appears stated age Ears: normal TM's and external ear canals both ears Throat: lips, mucosa, and tongue normal; teeth and gums normal Neck: no adenopathy, no carotid bruit, supple, symmetrical, trachea midline and thyroid not enlarged, symmetric, no tenderness/mass/nodules Back: symmetric, no curvature. ROM normal. No CVA tenderness. Lungs: clear to auscultation bilaterally Heart: regular rate and rhythm, S1, S2 normal, no murmur, click, rub or gallop Abdomen: soft, non-tender; bowel sounds normal; no masses,  no organomegaly Pulses: 2+ and symmetric Skin: Skin color, texture, turgor normal. No rashes or lesions Lymph nodes: Cervical, supraclavicular, and axillary nodes normal.  No results found for: HGBA1C  Lab Results  Component Value Date   CREATININE 0.57 09/18/2014   CREATININE 0.5 09/19/2013   CREATININE 0.7 08/24/2012    Lab Results  Component Value Date   WBC 8.9 09/19/2013   HGB 13.6 09/19/2013   HCT 40.4 09/19/2013   PLT 282.0 09/19/2013   GLUCOSE 114* 09/18/2014   CHOL 285* 07/28/2011   TRIG 206.0* 07/28/2011   HDL 49.10 07/28/2011   LDLDIRECT 249.6 08/24/2012   ALT 22 09/19/2013   AST 23 09/19/2013   NA 134* 09/18/2014   K 4.3 09/18/2014   CL 96 09/18/2014   CREATININE 0.57 09/18/2014   BUN 11 09/18/2014   CO2 32 09/18/2014   TSH 1.14 09/19/2013    No results found.  Assessment & Plan:   Problem List Items Addressed This Visit    Encounter for end of life care    During the course of the visit , End of Life objectives were reviewed  at length,  And MOST form was reviewed and signed.    A total of 40 minutes was spent with patient more than half of which was spent in counseling patient on the above mentioned  issues , reviewing and explaining recent labs and imaging studies done, and coordination of care.       Other Visit Diagnoses    Need for influenza vaccination    -  Primary    Relevant Orders    Flu Vaccine QUAD 36+ mos PF IM (Fluarix & Fluzone Quad PF) (Completed)    Need for 23-polyvalent pneumococcal polysaccharide vaccine        Relevant Orders    Pneumococcal polysaccharide vaccine 23-valent greater than or equal to 2yo subcutaneous/IM (Completed)       I have discontinued Ms. Grismore's amoxicillin, benzonatate, and amoxicillin-clavulanate. I am also having her maintain her Vitamin D3, BACLOFEN IT, LORazepam, DERMACLOUD, Wheelchair, NON FORMULARY, OnabotulinumtoxinA (BOTOX IJ), furosemide, gabapentin, oxyCODONE-acetaminophen, lisinopril, DULoxetine, lidocaine, nystatin cream, predniSONE,  and oxyCODONE.  Meds ordered this encounter  Medications  . DISCONTD: oxyCODONE (OXYCONTIN) 30 MG 12 hr tablet    Sig: Take 1 tablet by mouth every 12 (twelve) hours as needed. HOSPICE PATIENT    Dispense:  30 each    Refill:  0    Hospice patient  . oxyCODONE (OXYCONTIN) 30 MG 12 hr tablet    Sig: Take 1 tablet by mouth every 12 (twelve) hours as needed. HOSPICE PATIENT    Dispense:  60 each    Refill:  0    Hospice patient    Medications Discontinued During This Encounter  Medication Reason  . amoxicillin (AMOXIL) 500 MG capsule Completed Course  . amoxicillin-clavulanate (AUGMENTIN) 875-125 MG tablet Completed Course  . benzonatate (TESSALON) 100 MG capsule Completed Course  . oxyCODONE (OXYCONTIN) 30 MG 12 hr tablet Reorder  . oxyCODONE (OXYCONTIN) 30 MG 12 hr tablet Reorder    Follow-up: No Follow-up on file.   Crecencio Mc, MD

## 2015-06-12 NOTE — Progress Notes (Signed)
Pre visit review using our clinic review tool, if applicable. No additional management support is needed unless otherwise documented below in the visit note. 

## 2015-06-14 DIAGNOSIS — N390 Urinary tract infection, site not specified: Secondary | ICD-10-CM | POA: Diagnosis not present

## 2015-06-14 NOTE — Assessment & Plan Note (Addendum)
During the course of the visit , End of Life objectives were reviewed  at length,  And MOST form was reviewed and signed.    A total of 40 minutes was spent with patient more than half of which was spent in counseling patient on the above mentioned issues , reviewing and explaining recent labs and imaging studies done, and coordination of care.

## 2015-06-16 ENCOUNTER — Telehealth: Payer: Self-pay

## 2015-06-16 NOTE — Telephone Encounter (Signed)
error 

## 2015-06-17 ENCOUNTER — Telehealth: Payer: Self-pay | Admitting: Internal Medicine

## 2015-06-17 NOTE — Telephone Encounter (Signed)
[place] called, Helene Kelp. She faxed  urine results to office. Wanted to know if she should start antibiotics on pt. Please call [phone]

## 2015-06-17 NOTE — Telephone Encounter (Signed)
We are waiting for sensitivities on the urine culture

## 2015-06-18 NOTE — Telephone Encounter (Signed)
Notified Helene Kelp of Dr. Lupita Dawn comments, she also stated FYI that there is a rash on Ms Brake's face that could be possible shingles, she will be seeing another provider tomorrow./tvw

## 2015-06-18 NOTE — Telephone Encounter (Signed)
LMOM for Helene Kelp to call the office for results./tvw

## 2015-06-19 ENCOUNTER — Ambulatory Visit (INDEPENDENT_AMBULATORY_CARE_PROVIDER_SITE_OTHER): Payer: Medicare Other | Admitting: Internal Medicine

## 2015-06-19 ENCOUNTER — Encounter: Payer: Self-pay | Admitting: Internal Medicine

## 2015-06-19 ENCOUNTER — Telehealth: Payer: Self-pay

## 2015-06-19 VITALS — BP 118/70 | HR 101 | Temp 98.3°F | Resp 12

## 2015-06-19 DIAGNOSIS — B029 Zoster without complications: Secondary | ICD-10-CM

## 2015-06-19 MED ORDER — ACYCLOVIR 400 MG PO TABS: 400.0000 mg | ORAL_TABLET | Freq: Every day | ORAL | Status: DC

## 2015-06-19 MED ORDER — CEPHALEXIN 500 MG PO CAPS: 500.0000 mg | ORAL_CAPSULE | Freq: Four times a day (QID) | ORAL | Status: DC

## 2015-06-19 MED ORDER — LACTULOSE 20 GM/30ML PO SOLN: ORAL | Status: DC

## 2015-06-19 NOTE — Progress Notes (Signed)
Subjective:  Patient ID: Angelica Wilcox, female    DOB: [birth year]  Age: 70 y.o. MRN: [id number]  CC: The encounter diagnosis was Shingles outbreak.  HPI Angelica Wilcox presents for right sided vesicular rash covering face and scalp.  Symptoms started with a fever 5 days ago, accompanied by nausea, anorexia and dry heaves, which have resolved .  The rash is painful,  Burning quality.  She feels that the outbreak was caused by her flu and pneumonia vaccines, because she recalls having a similar reaction in Nov 2015 after her last flu vaccine .  Unfortunately this reaction was not documented at the time. She has a vesicle above her  right eye , under the eyebrow,  Which is causing slight ptosis and interfering with her vision.        Outpatient Prescriptions Prior to Visit  Medication Sig Dispense Refill  . BACLOFEN IT 65.07 mcg model No. [id number] implant date 09/22/11 At [facility]    . Cholecalciferol (VITAMIN D3) 2000 UNITS TABS Take 1 tablet by mouth daily.     . DULoxetine (CYMBALTA) 60 MG capsule TAKE 1 CAPSULE BY MOUTH AT BEDTIME 30 capsule 2  . furosemide (LASIX) 20 MG tablet TAKE 1 TABLET (20 MG TOTAL) BY MOUTH DAILY. 30 tablet 5  . gabapentin (NEURONTIN) 600 MG tablet TAKE 1 TABLET BY MOUTH TWICE A DAY 60 tablet 6  . Infant Care Products (DERMACLOUD) CREA Apply to affected areas 2-3 times a day. 430 g 2  . lidocaine (XYLOCAINE) 2 % jelly APPLY TO AFFECTED AREA AS NEEDED  6  . lisinopril (PRINIVIL,ZESTRIL) 20 MG tablet TAKE 1 TABLET BY MOUTH EVERY DAY 90 tablet 1  . LORazepam (ATIVAN) 0.5 MG tablet Take 1 tablet (0.5 mg total) by mouth daily as needed. 30 tablet 3  . Misc. Devices [facility]) MISC 1 each by Other route.    . NON FORMULARY 500 mcg by Other route.    . nystatin cream (MYCOSTATIN) APPLY TO AFFECTED AREA TWICE A DAY AS NEEDED  0  . OnabotulinumtoxinA (BOTOX IJ) Inject 300 Units as directed.    Marland Kitchen oxyCODONE (OXYCONTIN) 30 MG 12 hr tablet Take 1 tablet by mouth every 12  (twelve) hours as needed. HOSPICE PATIENT 60 each 0  . oxyCODONE-acetaminophen (PERCOCET/ROXICET) 5-325 MG tablet Take 1-2 tablets by mouth every 6 (six) hours as needed for severe pain. 60 tablet 0  . predniSONE (DELTASONE) 10 MG tablet 6 tablets on Day 1 , then reduce by 1 tablet daily until gone 21 tablet 0   No facility-administered medications prior to visit.    Review of Systems;  Patient denies headache, fevers, malaise, unintentional weight loss, skin rash, eye pain, sinus congestion and sinus pain, sore throat, dysphagia,  hemoptysis , cough, dyspnea, wheezing, chest pain, palpitations, orthopnea, edema, abdominal pain, nausea, melena, diarrhea, constipation, flank pain, dysuria, hematuria, urinary  Frequency, nocturia, numbness, tingling, seizures,  Focal weakness, Loss of consciousness,  Tremor, insomnia, depression, anxiety, and suicidal ideation.      Objective:  BP 118/70 mmHg  Pulse 101  Temp(Src) 98.3 F (36.8 C) (Oral)  Resp 12  Wt   SpO2 93%  BP Readings from Last 3 Encounters:  06/19/15 118/70  06/12/15 110/66  05/19/15 122/72    Wt Readings from Last 3 Encounters:  01/01/14 150 lb (68.04 kg)  05/30/13 150 lb (68.04 kg)  05/11/13 150 lb (68.04 kg)    General appearance: alert, cooperative and appears stated age Ears: normal TM's and  external ear canals both ears Throat: lips, mucosa, and tongue normal; teeth and gums normal Neck: no adenopathy, no carotid bruit, supple, symmetrical, trachea midline and thyroid not enlarged, symmetric, no tenderness/mass/nodules Skin: herpetiform papular rash involving right side of face and scalp, including periorbital area Lymph nodes: Cervical, supraclavicular, and axillary nodes normal.  No results found for: HGBA1C  Lab Results  Component Value Date   CREATININE 0.57 09/18/2014   CREATININE 0.5 09/19/2013   CREATININE 0.7 08/24/2012    Lab Results  Component Value Date   WBC 8.9 09/19/2013   HGB 13.6  09/19/2013   HCT 40.4 09/19/2013   PLT 282.0 09/19/2013   GLUCOSE 114* 09/18/2014   CHOL 285* 07/28/2011   TRIG 206.0* 07/28/2011   HDL 49.10 07/28/2011   LDLDIRECT 249.6 08/24/2012   ALT 22 09/19/2013   AST 23 09/19/2013   NA 134* 09/18/2014   K 4.3 09/18/2014   CL 96 09/18/2014   CREATININE 0.57 09/18/2014   BUN 11 09/18/2014   CO2 32 09/18/2014   TSH 1.14 09/19/2013    No results found.  Assessment & Plan:   Problem List Items Addressed This Visit    Shingles outbreak - Primary    Treating with acyclovir and referring to Ophthalmoloy (appointment offered today but deferred by pateint until tomorrow) since the dermatome involves the ophthalmic region of the right facial nerve.       Relevant Medications   acyclovir (ZOVIRAX) 400 MG tablet   Other Relevant Orders   Ambulatory referral to Ophthalmology      I am having Angelica Wilcox start on Lactulose and acyclovir. I am also having her maintain her Vitamin D3, BACLOFEN IT, LORazepam, DERMACLOUD, Wheelchair, NON FORMULARY, OnabotulinumtoxinA (BOTOX IJ), furosemide, gabapentin, oxyCODONE-acetaminophen, lisinopril, DULoxetine, lidocaine, nystatin cream, predniSONE, and oxyCODONE.  Meds ordered this encounter  Medications  . Lactulose 20 GM/30ML SOLN    Sig: 30 ml every 4 hours until constipation is relieved    Dispense:  236 mL    Refill:  11    FOR USE TWICE WEEKLY MAXIMUM  . acyclovir (ZOVIRAX) 400 MG tablet    Sig: Take 1 tablet (400 mg total) by mouth 5 (five) times daily.    Dispense:  25 tablet    Refill:  0   A total of 25 minutes of face to face time was spent with patient more than half of which was spent in counselling about the above mentioned conditions  and coordination of care  There are no discontinued medications.  Follow-up: No Follow-up on file.   Crecencio Mc, MD

## 2015-06-19 NOTE — Progress Notes (Signed)
Pre-visit discussion using our clinic review tool. No additional management support is needed unless otherwise documented below in the visit note.  

## 2015-06-19 NOTE — Telephone Encounter (Signed)
Pt was seen today and she forgot to ask you about her U/A, so Helene Kelp from Hospice was calling about what antbiotic you were going to start Angelica Wilcox on based on U/A results. Please advise/tvw

## 2015-06-19 NOTE — Telephone Encounter (Signed)
Patient notified and voiced understanding.

## 2015-06-19 NOTE — Patient Instructions (Signed)
I am prescribing acyclovir to takes 5 times daily for the next 5 days for your shingles outbreak  Urgent referral to [facility] is underway since it is involving the right eye area   I am prescribing Lactulose,  A cathartic laxative that works in 3-4 hours to produce a bowel movement.  DO NOT USE DAILY,  USE MAXIMUM TWICE WEEKLY   Shingles Shingles, which is also known as herpes zoster, is an infection that causes a painful skin rash and fluid-filled blisters. Shingles is not related to genital herpes, which is a sexually transmitted infection.   Shingles only develops in people who:  Have had chickenpox.  Have received the chickenpox vaccine. (This is rare.) CAUSES Shingles is caused by varicella-zoster virus (VZV). This is the same virus that causes chickenpox. After exposure to VZV, the virus stays in the body in an inactive (dormant) state. Shingles develops if the virus reactivates. This can happen many years after the initial exposure to VZV. It is not known what causes this virus to reactivate. RISK FACTORS People who have had chickenpox or received the chickenpox vaccine are at risk for shingles. Infection is more common in people who:  Are older than age 74.  Have a weakened defense (immune) system, such as those with HIV, AIDS, or cancer.  Are taking medicines that weaken the immune system, such as transplant medicines.  Are under great stress. SYMPTOMS Early symptoms of this condition include itching, tingling, and pain in an area on your skin. Pain may be described as burning, stabbing, or throbbing. A few days or weeks after symptoms start, a painful red rash appears, usually on one side of the body in a bandlike or beltlike pattern. The rash eventually turns into fluid-filled blisters that break open, scab over, and dry up in about 2-3 weeks. At any time during the infection, you may also develop:  A fever.  Chills.  A headache.  An upset  stomach. DIAGNOSIS This condition is diagnosed with a skin exam. Sometimes, skin or fluid samples are taken from the blisters before a diagnosis is made. These samples are examined under a microscope or sent to a lab for testing. TREATMENT There is no specific cure for this condition. Your health care provider will probably prescribe medicines to help you manage pain, recover more quickly, and avoid long-term problems. Medicines may include:  Antiviral drugs.  Anti-inflammatory drugs.  Pain medicines. If the area involved is on your face, you may be referred to a specialist, such as an eye doctor (ophthalmologist) or an ear, nose, and throat (ENT) doctor to help you avoid eye problems, chronic pain, or disability. HOME CARE INSTRUCTIONS Medicines  Take medicines only as directed by your health care provider.  Apply an anti-itch or numbing cream to the affected area as directed by your health care provider. Blister and Rash Care  Take a cool bath or apply cool compresses to the area of the rash or blisters as directed by your health care provider. This may help with pain and itching.  Keep your rash covered with a loose bandage (dressing). Wear loose-fitting clothing to help ease the pain of material rubbing against the rash.  Keep your rash and blisters clean with mild soap and cool water or as directed by your health care provider.  Check your rash every day for signs of infection. These include redness, swelling, and pain that lasts or increases.  Do not pick your blisters.  Do not scratch your rash. General  Instructions  Rest as directed by your health care provider.  Keep all follow-up visits as directed by your health care provider. This is important.  Until your blisters scab over, your infection can cause chickenpox in people who have never had it or been vaccinated against it. To prevent this from happening, avoid contact with other people,  especially:  Babies.  Pregnant women.  Children who have eczema.  Elderly people who have transplants.  People who have chronic illnesses, such as leukemia or AIDS. SEEK MEDICAL CARE IF:  Your pain is not relieved with prescribed medicines.  Your pain does not get better after the rash heals.  Your rash looks infected. Signs of infection include redness, swelling, and pain that lasts or increases. SEEK IMMEDIATE MEDICAL CARE IF:  The rash is on your face or nose.  You have facial pain, pain around your eye area, or loss of feeling on one side of your face.  You have ear pain or you have ringing in your ear.  You have loss of taste.  Your condition gets worse.   This information is not intended to replace advice given to you by your health care provider. Make sure you discuss any questions you have with your health care provider.   Document Released: 05/17/2005 Document Revised: 06/07/2014 Document Reviewed: 03/28/2014 Elsevier Interactive Patient Education [employer].

## 2015-06-19 NOTE — Telephone Encounter (Signed)
Culture results received.  Will treat with cephalexin, which is our only option .  rx [facility] to [facility] [address]

## 2015-06-20 DIAGNOSIS — B0230 Zoster ocular disease, unspecified: Secondary | ICD-10-CM | POA: Diagnosis not present

## 2015-06-22 DIAGNOSIS — B029 Zoster without complications: Secondary | ICD-10-CM | POA: Insufficient documentation

## 2015-06-22 NOTE — Assessment & Plan Note (Addendum)
Treating with acyclovir and referring to Ophthalmoloy (appointment offered today but deferred by pateint until tomorrow) since the dermatome involves the ophthalmic region of the right facial nerve.

## 2015-06-24 ENCOUNTER — Other Ambulatory Visit: Payer: Self-pay

## 2015-06-24 ENCOUNTER — Telehealth: Payer: Self-pay | Admitting: Internal Medicine

## 2015-06-24 MED ORDER — OXYCODONE HCL ER 30 MG PO T12A: 1.0000 | EXTENDED_RELEASE_TABLET | Freq: Two times a day (BID) | ORAL | Status: DC | PRN

## 2015-06-24 MED ORDER — OXYCODONE-ACETAMINOPHEN 5-325 MG PO TABS: 1.0000 | ORAL_TABLET | Freq: Four times a day (QID) | ORAL | Status: DC | PRN

## 2015-06-24 NOTE — Telephone Encounter (Signed)
[place] called Rx refills on oxyCODONE (OXYCONTIN) 30 MG 12 hr tablet  Last filled on 06/12/15, and oxyCODONE-acetaminophen (PERCOCET/ROXICET) 5-325 MG per tablet  Last filled 04/29/15. Last OV 06/19/15. Please advise

## 2015-06-24 NOTE — Telephone Encounter (Signed)
Placed at front desk no charge patient aware to pick up.

## 2015-06-24 NOTE — Telephone Encounter (Signed)
Please advise 

## 2015-06-24 NOTE — Telephone Encounter (Signed)
Refills authorized and printed

## 2015-06-24 NOTE — Telephone Encounter (Signed)
Pt's care giver Juliann Pulse dropped off paper work for handicap sign to be filled out by Dr. Derrel Nip. Paper work is in Dr. Lupita Dawn box.

## 2015-06-24 NOTE — Telephone Encounter (Signed)
Placed in quick sign. 

## 2015-06-25 NOTE — Telephone Encounter (Signed)
Oxycodone faxed Hospice patient.

## 2015-06-27 ENCOUNTER — Telehealth: Payer: Self-pay

## 2015-06-27 DIAGNOSIS — B0230 Zoster ocular disease, unspecified: Secondary | ICD-10-CM | POA: Diagnosis not present

## 2015-06-27 NOTE — Telephone Encounter (Signed)
Rx re-faxed.

## 2015-07-02 DIAGNOSIS — Z435 Encounter for attention to cystostomy: Secondary | ICD-10-CM | POA: Diagnosis not present

## 2015-07-02 DIAGNOSIS — G35 Multiple sclerosis: Secondary | ICD-10-CM | POA: Diagnosis not present

## 2015-07-02 DIAGNOSIS — N99511 Cystostomy infection: Secondary | ICD-10-CM | POA: Diagnosis not present

## 2015-07-02 DIAGNOSIS — F341 Dysthymic disorder: Secondary | ICD-10-CM | POA: Diagnosis not present

## 2015-07-02 DIAGNOSIS — I10 Essential (primary) hypertension: Secondary | ICD-10-CM | POA: Diagnosis not present

## 2015-07-02 DIAGNOSIS — K5732 Diverticulitis of large intestine without perforation or abscess without bleeding: Secondary | ICD-10-CM | POA: Diagnosis not present

## 2015-07-02 DIAGNOSIS — G894 Chronic pain syndrome: Secondary | ICD-10-CM | POA: Diagnosis not present

## 2015-07-10 ENCOUNTER — Other Ambulatory Visit: Payer: Self-pay

## 2015-07-10 MED ORDER — OXYCODONE HCL ER 30 MG PO T12A: 1.0000 | EXTENDED_RELEASE_TABLET | Freq: Two times a day (BID) | ORAL | Status: DC | PRN

## 2015-07-10 NOTE — Telephone Encounter (Signed)
Printed refill for oxycodone will sign on  Friday

## 2015-07-10 NOTE — Telephone Encounter (Signed)
Please advise as patient needs refill, [facility] [address] then call Clarene Critchley back.

## 2015-07-11 NOTE — Telephone Encounter (Signed)
Script [facility]ed [facility] [address] for [facility] patient .

## 2015-07-30 ENCOUNTER — Other Ambulatory Visit: Payer: Self-pay

## 2015-07-30 DIAGNOSIS — F341 Dysthymic disorder: Secondary | ICD-10-CM | POA: Diagnosis not present

## 2015-07-30 DIAGNOSIS — Z435 Encounter for attention to cystostomy: Secondary | ICD-10-CM | POA: Diagnosis not present

## 2015-07-30 DIAGNOSIS — N99511 Cystostomy infection: Secondary | ICD-10-CM | POA: Diagnosis not present

## 2015-07-30 DIAGNOSIS — K5732 Diverticulitis of large intestine without perforation or abscess without bleeding: Secondary | ICD-10-CM | POA: Diagnosis not present

## 2015-07-30 DIAGNOSIS — G35 Multiple sclerosis: Secondary | ICD-10-CM | POA: Diagnosis not present

## 2015-07-30 DIAGNOSIS — I10 Essential (primary) hypertension: Secondary | ICD-10-CM | POA: Diagnosis not present

## 2015-07-30 DIAGNOSIS — G894 Chronic pain syndrome: Secondary | ICD-10-CM | POA: Diagnosis not present

## 2015-07-30 NOTE — Telephone Encounter (Signed)
Teresa from Hospice is requesting a refill on this pt's oxycotin, pt is currently on hospice. Last filled 07/10/15 #60 tabs with Daneil Dolin, pt last OV was 06/19/15. Please advise, thanks

## 2015-07-31 MED ORDER — OXYCODONE HCL ER 30 MG PO T12A: 1.0000 | EXTENDED_RELEASE_TABLET | Freq: Two times a day (BID) | ORAL | Status: DC | PRN

## 2015-07-31 NOTE — Telephone Encounter (Signed)
Refilled. [place] remind pateints that a minimum o f 24 hours is needed for refill requests

## 2015-08-09 ENCOUNTER — Other Ambulatory Visit: Payer: Self-pay | Admitting: Internal Medicine

## 2015-08-13 ENCOUNTER — Other Ambulatory Visit: Payer: Self-pay | Admitting: Internal Medicine

## 2015-08-13 ENCOUNTER — Ambulatory Visit (INDEPENDENT_AMBULATORY_CARE_PROVIDER_SITE_OTHER): Admitting: Neurology

## 2015-08-13 ENCOUNTER — Encounter: Payer: Self-pay | Admitting: Neurology

## 2015-08-13 VITALS — BP 107/63 | HR 96

## 2015-08-13 DIAGNOSIS — G35 Multiple sclerosis: Secondary | ICD-10-CM

## 2015-08-13 DIAGNOSIS — G8250 Quadriplegia, unspecified: Secondary | ICD-10-CM

## 2015-08-13 MED ORDER — OXYCODONE-ACETAMINOPHEN 5-325 MG PO TABS: 1.0000 | ORAL_TABLET | Freq: Four times a day (QID) | ORAL | Status: DC | PRN

## 2015-08-13 MED ORDER — BACLOFEN 10000 MCG/20ML IT SOLN
10000.0000 ug | Freq: Once | INTRATHECAL | Status: AC
  Administered 2015-08-13: 10000 ug via INTRATHECAL

## 2015-08-13 MED ORDER — OXYCODONE HCL ER 30 MG PO T12A: 1.0000 | EXTENDED_RELEASE_TABLET | Freq: Two times a day (BID) | ORAL | Status: DC | PRN

## 2015-08-13 NOTE — Telephone Encounter (Signed)
Refill faxed as requested. 

## 2015-08-13 NOTE — Telephone Encounter (Signed)
PRINTED

## 2015-08-13 NOTE — Procedures (Signed)
      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 She believes that her baclofen pump settings are adequate at this time. She comes in for a baclofen pump refill.  Baclofen pump refill note  The baclofen pump site was cleaned with Betadine solution. A 21-gauge needle was inserted into the pump port site. Approximately 4 cc of residual baclofen was removed, the pump residual noted was 2.8 cc. 20 cc of replacement baclofen was placed into the pump at 500 mcg/cc concentration.  The pump was reprogrammed for the following settings: The pump was kept at a simple continuous rate, 100.07 g per day.  The alarm volume is set at 1.5 cc. The next alarm date is 11/13/2015.  The patient tolerated the procedure well. There were no complications of the above procedure.  The [place] number is [phone] The baclofen expiration date is August 2018. The baclofen lot number is [id number].

## 2015-08-13 NOTE — Telephone Encounter (Signed)
Helene Kelp called from [facility] regarding pt needs to two new prescription for oxyCODONE (OXYCONTIN) 30 MG 12 hr tablet twice a day 30 tablets and oxyCODONE-acetaminophen (PERCOCET/ROXICET) 5-325 MG tablet one every six hours 60 tablets. Nurse stated write on Rx hospice pt and fax. [id number] [name], [place] fax number [phone]. Thank you!

## 2015-08-13 NOTE — Progress Notes (Signed)
Please refer to baclofen pump refill procedure note. 

## 2015-08-13 NOTE — Telephone Encounter (Signed)
Hospice is requesting a refill on pt's Oxycotin and Percocet. Please advise, thanks

## 2015-08-30 DIAGNOSIS — K5732 Diverticulitis of large intestine without perforation or abscess without bleeding: Secondary | ICD-10-CM | POA: Diagnosis not present

## 2015-08-30 DIAGNOSIS — I10 Essential (primary) hypertension: Secondary | ICD-10-CM | POA: Diagnosis not present

## 2015-08-30 DIAGNOSIS — F341 Dysthymic disorder: Secondary | ICD-10-CM | POA: Diagnosis not present

## 2015-08-30 DIAGNOSIS — N99511 Cystostomy infection: Secondary | ICD-10-CM | POA: Diagnosis not present

## 2015-08-30 DIAGNOSIS — G894 Chronic pain syndrome: Secondary | ICD-10-CM | POA: Diagnosis not present

## 2015-08-30 DIAGNOSIS — G35 Multiple sclerosis: Secondary | ICD-10-CM | POA: Diagnosis not present

## 2015-08-30 DIAGNOSIS — Z435 Encounter for attention to cystostomy: Secondary | ICD-10-CM | POA: Diagnosis not present

## 2015-09-07 ENCOUNTER — Other Ambulatory Visit: Payer: Self-pay | Admitting: Internal Medicine

## 2015-09-09 ENCOUNTER — Other Ambulatory Visit: Payer: Self-pay | Admitting: Internal Medicine

## 2015-09-09 MED ORDER — OXYCODONE HCL ER 30 MG PO T12A: 1.0000 | EXTENDED_RELEASE_TABLET | Freq: Two times a day (BID) | ORAL | Status: DC | PRN

## 2015-09-09 MED ORDER — OXYCODONE-ACETAMINOPHEN 5-325 MG PO TABS: 1.0000 | ORAL_TABLET | Freq: Four times a day (QID) | ORAL | Status: DC | PRN

## 2015-09-09 NOTE — Telephone Encounter (Signed)
Seth Bake [id number] called from [facility] regarding pt needing a Rx refill for oxyCODONE (OXYCONTIN) 30 MG 12 hr tablet and PRN as well. Please put on script Hospice. [id number] [name], [place].  Thank you!

## 2015-09-09 NOTE — Addendum Note (Signed)
Addended by: Bevelyn Ngo on: 09/09/2015 04:45 PM   Modules accepted: Orders

## 2015-09-09 NOTE — Telephone Encounter (Signed)
Please advise refill, thanks 

## 2015-09-09 NOTE — Telephone Encounter (Signed)
Refill faxed to pharmacy as requested. Hospice nurse notified.

## 2015-09-09 NOTE — Telephone Encounter (Signed)
refilled 

## 2015-09-22 ENCOUNTER — Other Ambulatory Visit: Payer: Self-pay | Admitting: Internal Medicine

## 2015-09-24 ENCOUNTER — Other Ambulatory Visit: Payer: Self-pay | Admitting: Internal Medicine

## 2015-09-24 MED ORDER — OXYCODONE HCL ER 30 MG PO T12A: 1.0000 | EXTENDED_RELEASE_TABLET | Freq: Two times a day (BID) | ORAL | Status: DC | PRN

## 2015-09-24 MED ORDER — OXYCODONE-ACETAMINOPHEN 5-325 MG PO TABS: 1.0000 | ORAL_TABLET | Freq: Four times a day (QID) | ORAL | Status: DC | PRN

## 2015-09-24 NOTE — Telephone Encounter (Signed)
[facility] is requesting for pt a refill on oxyCODONE (OXYCONTIN) 30 MG 12 hr tablet and oxyCODONE-acetaminophen (PERCOCET/ROXICET) 5-325 MG tablet. [facility] [address]

## 2015-09-24 NOTE — Telephone Encounter (Signed)
Please advise as these were filled on 09/09/2015. thanks

## 2015-09-24 NOTE — Telephone Encounter (Signed)
Meds refilled.

## 2015-09-29 DIAGNOSIS — G35 Multiple sclerosis: Secondary | ICD-10-CM | POA: Diagnosis not present

## 2015-09-29 DIAGNOSIS — I10 Essential (primary) hypertension: Secondary | ICD-10-CM | POA: Diagnosis not present

## 2015-09-29 DIAGNOSIS — N99511 Cystostomy infection: Secondary | ICD-10-CM | POA: Diagnosis not present

## 2015-09-29 DIAGNOSIS — K5732 Diverticulitis of large intestine without perforation or abscess without bleeding: Secondary | ICD-10-CM | POA: Diagnosis not present

## 2015-09-29 DIAGNOSIS — G894 Chronic pain syndrome: Secondary | ICD-10-CM | POA: Diagnosis not present

## 2015-09-29 DIAGNOSIS — Z435 Encounter for attention to cystostomy: Secondary | ICD-10-CM | POA: Diagnosis not present

## 2015-09-29 DIAGNOSIS — F341 Dysthymic disorder: Secondary | ICD-10-CM | POA: Diagnosis not present

## 2015-10-08 ENCOUNTER — Telehealth: Payer: Self-pay | Admitting: Internal Medicine

## 2015-10-08 NOTE — Telephone Encounter (Signed)
Patient complaining of vaginal dryness. Wanting to use an estrogen cream or gel.

## 2015-10-08 NOTE — Telephone Encounter (Signed)
It would be too difficult to see patient for this problem given her paralysis and transportation issues.  Ok to allow Hospice to order estrogen cream or their alternative

## 2015-10-08 NOTE — Telephone Encounter (Signed)
I spoke to [name] nurse for patient. Patient has just started complaining of vaginal dryness in the last 2 months, Hospice needs ok to order a medication for this problem. I informed her that you have not seen patient for this particular problem in the past and you may want to see her. Please advise. Helene Kelp contact [phone]

## 2015-10-09 NOTE — Telephone Encounter (Signed)
Helene Kelp notified and will call the Hospice pharmacy to get the cream for Ever,

## 2015-10-24 ENCOUNTER — Other Ambulatory Visit: Payer: Self-pay | Admitting: *Deleted

## 2015-10-24 MED ORDER — OXYCODONE-ACETAMINOPHEN 5-325 MG PO TABS: 1.0000 | ORAL_TABLET | Freq: Four times a day (QID) | ORAL | Status: DC | PRN

## 2015-10-24 MED ORDER — OXYCODONE HCL ER 30 MG PO T12A: 1.0000 | EXTENDED_RELEASE_TABLET | Freq: Two times a day (BID) | ORAL | Status: DC | PRN

## 2015-10-24 NOTE — Telephone Encounter (Signed)
Refills faxed.

## 2015-10-24 NOTE — Telephone Encounter (Signed)
PRINTED,

## 2015-10-24 NOTE — Telephone Encounter (Signed)
[facility] has requested to have patients oxycodone 30 and oxycodone 5-325 refilled [facility] [address]

## 2015-10-30 DIAGNOSIS — G894 Chronic pain syndrome: Secondary | ICD-10-CM | POA: Diagnosis not present

## 2015-10-30 DIAGNOSIS — N99511 Cystostomy infection: Secondary | ICD-10-CM | POA: Diagnosis not present

## 2015-10-30 DIAGNOSIS — Z435 Encounter for attention to cystostomy: Secondary | ICD-10-CM | POA: Diagnosis not present

## 2015-10-30 DIAGNOSIS — K5732 Diverticulitis of large intestine without perforation or abscess without bleeding: Secondary | ICD-10-CM | POA: Diagnosis not present

## 2015-10-30 DIAGNOSIS — I10 Essential (primary) hypertension: Secondary | ICD-10-CM | POA: Diagnosis not present

## 2015-10-30 DIAGNOSIS — F341 Dysthymic disorder: Secondary | ICD-10-CM | POA: Diagnosis not present

## 2015-10-30 DIAGNOSIS — G35 Multiple sclerosis: Secondary | ICD-10-CM | POA: Diagnosis not present

## 2015-11-05 ENCOUNTER — Other Ambulatory Visit: Payer: Self-pay | Admitting: Internal Medicine

## 2015-11-11 ENCOUNTER — Other Ambulatory Visit: Payer: Self-pay | Admitting: Internal Medicine

## 2015-11-11 MED ORDER — OXYCODONE HCL ER 30 MG PO T12A: 1.0000 | EXTENDED_RELEASE_TABLET | Freq: Two times a day (BID) | ORAL | Status: DC | PRN

## 2015-11-11 NOTE — Telephone Encounter (Signed)
Last refilled on 5/26 #60.  Please advise refill. thanks

## 2015-11-11 NOTE — Telephone Encounter (Signed)
PRINTED

## 2015-11-11 NOTE — Telephone Encounter (Signed)
Helene Kelp [phone] called from [facility] regarding needing a Rx for oxyCODONE (OXYCONTIN) 30 MG 12 hr tablet.   [id number] [name], [place]. Thank you!

## 2015-11-11 NOTE — Telephone Encounter (Signed)
Script faxed to [employer] .

## 2015-11-17 ENCOUNTER — Telehealth: Payer: Self-pay | Admitting: Neurology

## 2015-11-17 NOTE — Telephone Encounter (Signed)
Angelica Wilcox has already called back and feels this patient needs an urgent appt.

## 2015-11-17 NOTE — Telephone Encounter (Signed)
Angela/Caregiver for patient is calling and states she believes patient needs an appt for a Baclofen refill.  Please call.

## 2015-11-17 NOTE — Telephone Encounter (Signed)
Returned call and spoke to caregiver, Levada Dy. Appt scheduled for noon tomorrow for pump refill.

## 2015-11-18 ENCOUNTER — Encounter: Payer: Self-pay | Admitting: Neurology

## 2015-11-18 ENCOUNTER — Ambulatory Visit (INDEPENDENT_AMBULATORY_CARE_PROVIDER_SITE_OTHER): Admitting: Neurology

## 2015-11-18 VITALS — BP 130/82 | HR 78 | Ht 62.0 in | Wt 150.0 lb

## 2015-11-18 DIAGNOSIS — G8250 Quadriplegia, unspecified: Secondary | ICD-10-CM | POA: Diagnosis not present

## 2015-11-18 DIAGNOSIS — G35 Multiple sclerosis: Secondary | ICD-10-CM

## 2015-11-18 MED ORDER — BACLOFEN 10 MG/20ML IT SOLN
10.0000 mg | Freq: Once | INTRATHECAL | Status: AC
  Administered 2015-11-18: 10 mg via INTRATHECAL

## 2015-11-18 NOTE — Procedures (Signed)
       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 The patient has had some increase in spasticity involving the feet and ankles with spasms and discomfort. She also reports increased problems with sensory paresthesias all 4 extremities. The patient is on OxyContin and gabapentin. The patient is coming back for a baclofen pump refill.  Baclofen pump refill note  The baclofen pump site was cleaned with Betadine solution. A 21-gauge needle was inserted into the pump port site. Approximately 1.5 cc of residual baclofen was removed. 20 cc of replacement baclofen was placed into the pump at 500 mcg/cc concentration.  The pump was reprogrammed for the following settings: The patient is at a simple continuous rate which was increased from 100.07 g per day to a rate of 109.97 g per day.  The alarm volume is set at 1.0 mL. The next alarm date is 02/12/2016.  The patient tolerated the procedure well. There were no complications of the above procedure.  The [place] number is [id number] The baclofen expiration date is August 2018. The baclofen lot number is [id number].

## 2015-11-18 NOTE — Progress Notes (Signed)
Baclofen 10,000 mcg/20 ml (500 mcg/ml) NDC [id number]  Lot [id number]  Exp 08/18   I discussed the possibility of using Ocrevus with this patient, she wishes to learn more about the medication. I will try to get a revisit set up with her for this purpose.

## 2015-11-19 ENCOUNTER — Telehealth: Payer: Self-pay

## 2015-11-19 ENCOUNTER — Telehealth: Payer: Self-pay | Admitting: Neurology

## 2015-11-19 NOTE — Telephone Encounter (Signed)
Returned [employer] and spoke to [place]. Let her know that Baclofen pump was reprogrammed to 109.97 per day. Also reviewed f/u appts that were scheduled eariler w/ pt to discuss Ocrevus and for next pump refill. Verbalized understanding and appreciation for call.

## 2015-11-19 NOTE — Telephone Encounter (Signed)
-----   Message from Kathrynn Ducking, MD sent at 11/18/2015  2:48 PM EDT ----- This patient has a baclofen pump alarm date on 02/12/2016, need to have a revisit for baclofen pump refill prior to this.  Also, please set up a routine revisit with this patient sometime in the next 6 weeks to discuss the use of Ocrevus. Thank you.

## 2015-11-19 NOTE — Telephone Encounter (Signed)
Teresa/[facility] [phone] called sts pt said Dr Jannifer Franklin increased the doseage on BACLOFEN IT today and she needs to know what it is now. Please call

## 2015-11-19 NOTE — Telephone Encounter (Signed)
Called pt and appts scheduled as requested in Aug and Sept.

## 2015-11-24 NOTE — Telephone Encounter (Signed)
Teresa/[facility] called advised pt has increased pain in arms and legs like "electricity is going thru them" since Friday 11/21/15. She is inquiring if this could be due to increase in baclofen

## 2015-11-24 NOTE — Telephone Encounter (Signed)
I called and talked with a caretaker. The patient had severe pain over the weekend on all 4 extremities, no definite increase in spasticity or spasms. An increase in the baclofen pump settings should not cause pain. This afternoon, the pain has gotten much better, the pain is gone at this point. If the pain returns, I'll get the patient worked into the office for an evaluation.

## 2015-11-24 NOTE — Telephone Encounter (Signed)
Returned [employer] and spoke to [place] w/ [facility]. Reviewed most common side effects of baclofen - decreased muscle tone/weakness, numbness and tingling. She reports that pt has been c/o of extremity pain. Pt had baclofen pump refilled w/ increased dose on 12/19/15. She then started c/o shooting, electricity-type pains in her arms and legs on Friday (12/21/15). The pain has increased throughout the weekend and interfered w/ sleep. Says that pain was some better this morning but pt took several doses of pain medication throughout the night.

## 2015-11-25 ENCOUNTER — Other Ambulatory Visit: Payer: Self-pay | Admitting: *Deleted

## 2015-11-25 MED ORDER — OXYCODONE HCL ER 30 MG PO T12A: 1.0000 | EXTENDED_RELEASE_TABLET | Freq: Two times a day (BID) | ORAL | Status: DC | PRN

## 2015-11-25 NOTE — Telephone Encounter (Signed)
Last refill was on 6/13 for #60, Please advise?

## 2015-11-25 NOTE — Telephone Encounter (Signed)
Refilled

## 2015-11-25 NOTE — Telephone Encounter (Signed)
[place] has requested a Rx for Oxycodone 30 mg 2x a day #30  [facility]acy [facility] [address]

## 2015-11-26 NOTE — Telephone Encounter (Signed)
Kathy faxed on 6/27 to Pharmacy. thanks

## 2015-11-29 DIAGNOSIS — F341 Dysthymic disorder: Secondary | ICD-10-CM | POA: Diagnosis not present

## 2015-11-29 DIAGNOSIS — K5732 Diverticulitis of large intestine without perforation or abscess without bleeding: Secondary | ICD-10-CM | POA: Diagnosis not present

## 2015-11-29 DIAGNOSIS — G35 Multiple sclerosis: Secondary | ICD-10-CM | POA: Diagnosis not present

## 2015-11-29 DIAGNOSIS — I10 Essential (primary) hypertension: Secondary | ICD-10-CM | POA: Diagnosis not present

## 2015-11-29 DIAGNOSIS — Z435 Encounter for attention to cystostomy: Secondary | ICD-10-CM | POA: Diagnosis not present

## 2015-11-29 DIAGNOSIS — N99511 Cystostomy infection: Secondary | ICD-10-CM | POA: Diagnosis not present

## 2015-11-29 DIAGNOSIS — G894 Chronic pain syndrome: Secondary | ICD-10-CM | POA: Diagnosis not present

## 2015-12-05 ENCOUNTER — Telehealth: Payer: Self-pay | Admitting: Neurology

## 2015-12-05 NOTE — Telephone Encounter (Signed)
Returned [employer] and spoke to [employer], Helene Kelp. She has discussed pt w/ Hospice medical director who says that pt would have to be discharged from Hospice if started on new MS med (Ocrevus) as this would not be covered. Let her know that this could be discussed w/ pt at her follow-up appt, as well.

## 2015-12-05 NOTE — Telephone Encounter (Signed)
Teresa/[facility] [phone] called said pt has appt on 8/2 and to discuss the new medication for MS.  She sts that medication not be a covered medication with them and the pt would need to be discharged if that was the case. She needs your permission to talk with her about that. Please call

## 2015-12-05 NOTE — Telephone Encounter (Signed)
Events noted, I will discuss this with the patient.

## 2015-12-09 ENCOUNTER — Telehealth: Payer: Self-pay | Admitting: Internal Medicine

## 2015-12-09 MED ORDER — OXYCODONE HCL ER 30 MG PO T12A: 1.0000 | EXTENDED_RELEASE_TABLET | Freq: Two times a day (BID) | ORAL | Status: DC | PRN

## 2015-12-09 NOTE — Telephone Encounter (Signed)
Teresa from [facility] called stating that the patient needed a refill on her oxyCODONE (OXYCONTIN) 30 MG 12 hr tablet #30 . Fax to [address]

## 2015-12-09 NOTE — Telephone Encounter (Signed)
faxed

## 2015-12-09 NOTE — Telephone Encounter (Signed)
Printed and given to provider for signature

## 2015-12-15 ENCOUNTER — Telehealth: Payer: Self-pay | Admitting: Internal Medicine

## 2015-12-15 NOTE — Telephone Encounter (Signed)
Please advise if these were completed thanks

## 2015-12-15 NOTE — Telephone Encounter (Signed)
Angelica Wilcox [id number] called [facility] regarding two physician order report that was due back by 12/19/15 was faxed 11/19/15. Fax number [phone]. Thank you!

## 2015-12-15 NOTE — Telephone Encounter (Signed)
Had hospice refax the form could not find the order.

## 2015-12-16 NOTE — Telephone Encounter (Signed)
Hospice orders received signed and faxed back to facility.

## 2015-12-19 ENCOUNTER — Telehealth: Payer: Self-pay | Admitting: *Deleted

## 2015-12-19 MED ORDER — OXYCODONE HCL ER 40 MG PO T12A: 40.0000 mg | EXTENDED_RELEASE_TABLET | Freq: Two times a day (BID) | ORAL | Status: DC

## 2015-12-19 MED ORDER — OXYCODONE-ACETAMINOPHEN 5-325 MG PO TABS: 1.0000 | ORAL_TABLET | Freq: Four times a day (QID) | ORAL | Status: DC | PRN

## 2015-12-19 NOTE — Telephone Encounter (Signed)
Notified Teresa at [facility] via VM

## 2015-12-19 NOTE — Telephone Encounter (Signed)
Gave to CMA to fax to pharmacy

## 2015-12-19 NOTE — Telephone Encounter (Signed)
DOSE INCREASED TO 40 MG EVERY 12 HOURS,  REFILLS ON OXYCODONE  PRINTED

## 2015-12-19 NOTE — Telephone Encounter (Signed)
Angelica Wilcox from [place] stated that pt is currently taking OxyContin 30 mg 2 a day and Oxycodone 10-15 mg daily . Hospice has requested to have the OxyContin increased to relieve pain.  Angelica Wilcox contact [phone]

## 2015-12-23 NOTE — Telephone Encounter (Signed)
[facility] Renaldo Fiddler, CMA

## 2015-12-23 NOTE — Telephone Encounter (Signed)
[facility]

## 2015-12-23 NOTE — Telephone Encounter (Signed)
Can you see if these two med [facility], thanks

## 2015-12-30 DIAGNOSIS — Z435 Encounter for attention to cystostomy: Secondary | ICD-10-CM | POA: Diagnosis not present

## 2015-12-30 DIAGNOSIS — I10 Essential (primary) hypertension: Secondary | ICD-10-CM | POA: Diagnosis not present

## 2015-12-30 DIAGNOSIS — F341 Dysthymic disorder: Secondary | ICD-10-CM | POA: Diagnosis not present

## 2015-12-30 DIAGNOSIS — N99511 Cystostomy infection: Secondary | ICD-10-CM | POA: Diagnosis not present

## 2015-12-30 DIAGNOSIS — K5732 Diverticulitis of large intestine without perforation or abscess without bleeding: Secondary | ICD-10-CM | POA: Diagnosis not present

## 2015-12-30 DIAGNOSIS — G894 Chronic pain syndrome: Secondary | ICD-10-CM | POA: Diagnosis not present

## 2015-12-30 DIAGNOSIS — G35 Multiple sclerosis: Secondary | ICD-10-CM | POA: Diagnosis not present

## 2015-12-31 ENCOUNTER — Ambulatory Visit: Payer: Self-pay | Admitting: Neurology

## 2016-01-06 ENCOUNTER — Telehealth: Payer: Self-pay | Admitting: Internal Medicine

## 2016-01-06 DIAGNOSIS — D239 Other benign neoplasm of skin, unspecified: Secondary | ICD-10-CM

## 2016-01-06 MED ORDER — OXYCODONE HCL ER 40 MG PO T12A: 40.0000 mg | EXTENDED_RELEASE_TABLET | Freq: Two times a day (BID) | ORAL | 0 refills | Status: DC

## 2016-01-06 NOTE — Telephone Encounter (Signed)
Angelica Wilcox [id number] called from [facility] regarding pt needs a new Rx for oxyCODONE (OXYCONTIN) 40 mg 12 hr tablet.  Pt also would like to see a dermatologist for a mole that's bleeding. Pt saw one before but cannot remember Dr's name.   [id number] [place], [place] you!

## 2016-01-06 NOTE — Telephone Encounter (Signed)
If I need to sign just let me know.

## 2016-01-06 NOTE — Telephone Encounter (Signed)
Rx refill requested, hospice patient.  Dr. Nicki Reaper can you please assist with Rx.     Dr Derrel Nip: Regarding referral: I don't see a dermatology on file from [place], please advise  thanks

## 2016-01-06 NOTE — Telephone Encounter (Signed)
Refilled,  Ordered derm

## 2016-01-06 NOTE — Telephone Encounter (Signed)
Script in folder to sign thanks

## 2016-01-06 NOTE — Telephone Encounter (Signed)
Faxed as she is a hospice pt. thanks

## 2016-01-07 ENCOUNTER — Telehealth: Payer: Self-pay

## 2016-01-07 ENCOUNTER — Other Ambulatory Visit: Payer: Self-pay | Admitting: Internal Medicine

## 2016-01-07 NOTE — Telephone Encounter (Signed)
Helene Kelp, nurse with Hospice care, states patient's son wanted to know if patient can get an antibiotic. Symptoms are: sore throat-started Friday 8/4, cough-non productive-started yesterday. No fever, no chills, no drainage, no shortness of breath, no wheezing. Helene Kelp did say yesterday patient had crackles in her left lung during nurse assessment. Patient used Duoneb nebulizer yesterday twice. Patient was advised to use Duoneb nebulizer every 4 hours today. Please review. CB [id number], RMA (patient receives Hospice care in her home)

## 2016-01-07 NOTE — Telephone Encounter (Signed)
Unless she has a productive cough or starts having fevers  She does not need an antibiotic,  It will just increase her risk for c dif colitis. .  Nurse should have offered vital signs including pulse ox?

## 2016-01-07 NOTE — Telephone Encounter (Signed)
Spoke with Angelica Wilcox and advised of below, and she states vitals were normal, 02 was 91% at room air and that is usually her normal. Angelica Wilcox will let us know if something changes.-aa

## 2016-01-20 ENCOUNTER — Telehealth: Payer: Self-pay | Admitting: Internal Medicine

## 2016-01-20 NOTE — Telephone Encounter (Signed)
Angelica Wilcox from [place], [phone]. Is requesting a refill on pt's oxyCODONE (OXYCONTIN) 40 mg 12 hr tablet. Pt also is on an antibiotic and has an yeast infection and cream is not cleaning it up.

## 2016-01-20 NOTE — Telephone Encounter (Signed)
Refill request for Oxycontin, last seen [id number], last filled 8AUG2017.  Pt also is on an antibiotic and has an yeast infection and cream is not cleaning it up. Please advise.

## 2016-01-21 MED ORDER — FLUCONAZOLE 150 MG PO TABS: 150.0000 mg | ORAL_TABLET | Freq: Every day | ORAL | 0 refills | Status: DC

## 2016-01-21 MED ORDER — OXYCODONE HCL ER 40 MG PO T12A: 40.0000 mg | EXTENDED_RELEASE_TABLET | Freq: Two times a day (BID) | ORAL | 0 refills | Status: DC

## 2016-01-21 NOTE — Telephone Encounter (Signed)
Diflucan sntn to pharmacy   oxycontin rx printed

## 2016-01-23 DIAGNOSIS — D239 Other benign neoplasm of skin, unspecified: Secondary | ICD-10-CM | POA: Diagnosis not present

## 2016-01-23 DIAGNOSIS — L918 Other hypertrophic disorders of the skin: Secondary | ICD-10-CM | POA: Diagnosis not present

## 2016-01-23 DIAGNOSIS — L820 Inflamed seborrheic keratosis: Secondary | ICD-10-CM | POA: Diagnosis not present

## 2016-01-30 ENCOUNTER — Other Ambulatory Visit: Payer: Self-pay | Admitting: *Deleted

## 2016-01-30 DIAGNOSIS — F341 Dysthymic disorder: Secondary | ICD-10-CM | POA: Diagnosis not present

## 2016-01-30 DIAGNOSIS — G894 Chronic pain syndrome: Secondary | ICD-10-CM | POA: Diagnosis not present

## 2016-01-30 DIAGNOSIS — G35 Multiple sclerosis: Secondary | ICD-10-CM | POA: Diagnosis not present

## 2016-01-30 DIAGNOSIS — N99511 Cystostomy infection: Secondary | ICD-10-CM | POA: Diagnosis not present

## 2016-01-30 DIAGNOSIS — Z435 Encounter for attention to cystostomy: Secondary | ICD-10-CM | POA: Diagnosis not present

## 2016-01-30 DIAGNOSIS — K5732 Diverticulitis of large intestine without perforation or abscess without bleeding: Secondary | ICD-10-CM | POA: Diagnosis not present

## 2016-01-30 DIAGNOSIS — I10 Essential (primary) hypertension: Secondary | ICD-10-CM | POA: Diagnosis not present

## 2016-01-30 MED ORDER — OXYCODONE-ACETAMINOPHEN 5-325 MG PO TABS: 1.0000 | ORAL_TABLET | Freq: Four times a day (QID) | ORAL | 0 refills | Status: DC | PRN

## 2016-01-30 MED ORDER — OXYCODONE HCL ER 40 MG PO T12A: 40.0000 mg | EXTENDED_RELEASE_TABLET | Freq: Two times a day (BID) | ORAL | 0 refills | Status: DC

## 2016-01-30 NOTE — Telephone Encounter (Signed)
[facility] RN called and states the patient needs a refill of her Oxycodone. [facility] [address] [name] you.

## 2016-01-30 NOTE — Telephone Encounter (Signed)
See message.

## 2016-01-30 NOTE — Telephone Encounter (Signed)
Please discuss with staff the  need for more specific information to be requested from patinets/Hospice RN when it comes to refills on narcotcs.  This mess took several phone calls to clear up

## 2016-01-30 NOTE — Telephone Encounter (Signed)
Patient needed percocet not OxyContin  , percocet refilled and faxed to pharmacy

## 2016-01-30 NOTE — Telephone Encounter (Signed)
Please ask the hospice RN to confirm that patient is using #60 oxycontin 40 mg weekly . That seems excessive .  I am not refilling until I have that confirmed.

## 2016-01-30 NOTE — Telephone Encounter (Signed)
Refill request for Oxycodone, last seen [id number], last filled 23AUG2017.  Please advise.

## 2016-02-03 ENCOUNTER — Other Ambulatory Visit: Payer: Self-pay

## 2016-02-04 ENCOUNTER — Telehealth: Payer: Self-pay | Admitting: *Deleted

## 2016-02-04 MED ORDER — OXYCODONE HCL ER 40 MG PO T12A: 40.0000 mg | EXTENDED_RELEASE_TABLET | Freq: Two times a day (BID) | ORAL | 0 refills | Status: DC

## 2016-02-04 MED ORDER — OXYCODONE-ACETAMINOPHEN 5-325 MG PO TABS: 1.0000 | ORAL_TABLET | Freq: Four times a day (QID) | ORAL | 0 refills | Status: DC | PRN

## 2016-02-04 NOTE — Telephone Encounter (Signed)
60 oxycodone tablets used  Since Sept 1?  I find this hard to  Believe . Patient needs to be called and questioned.  I am concerned that the meds are being redistributed. IF patient states that she is receiving 4 daily,  That would be #30 per week

## 2016-02-04 NOTE — Telephone Encounter (Signed)
The way teresa explained it was her insurance would not cover 60 tabs so they were wanting to change it to 30tabs.  At least that was what I was told by Helene Kelp.

## 2016-02-04 NOTE — Telephone Encounter (Signed)
Angelica Wilcox is wanting Oxycotin 40 mg refilled

## 2016-02-04 NOTE — Telephone Encounter (Signed)
Spoke to Angelica Wilcox,  She stated that RX needs to be changed to 30 tabs.  Refill request for Oxycodone, last seen [id number], last filled [id number].  Please advise.

## 2016-02-04 NOTE — Telephone Encounter (Signed)
Ok, plase ask them to mail me the one that was refused for  #60;  I will refill #30/week

## 2016-02-04 NOTE — Telephone Encounter (Signed)
Refill for oxycontin 40 mg Oxycontin given,  Qty #30 patient needs to schedule an appt since she has not been seen since January.

## 2016-02-04 NOTE — Telephone Encounter (Signed)
Angelica Wilcox from [facility] has requested a Rx refill for oxycodone  Contact [phone]

## 2016-02-04 NOTE — Telephone Encounter (Signed)
Angelica Wilcox has been informed by a left, detailed voicemail.

## 2016-02-10 ENCOUNTER — Ambulatory Visit (INDEPENDENT_AMBULATORY_CARE_PROVIDER_SITE_OTHER): Admitting: Neurology

## 2016-02-10 ENCOUNTER — Encounter: Payer: Self-pay | Admitting: Neurology

## 2016-02-10 VITALS — BP 122/80 | HR 84 | Ht 62.0 in | Wt 150.0 lb

## 2016-02-10 DIAGNOSIS — G8250 Quadriplegia, unspecified: Secondary | ICD-10-CM | POA: Diagnosis not present

## 2016-02-10 DIAGNOSIS — G35 Multiple sclerosis: Secondary | ICD-10-CM

## 2016-02-10 MED ORDER — BACLOFEN 10 MG/20ML IT SOLN
10.0000 mg | Freq: Once | INTRATHECAL | Status: AC
  Administered 2016-02-10: 10 mg via INTRATHECAL

## 2016-02-10 NOTE — Procedures (Signed)
      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 She continues to have worsening of function of the right arm, the right elbow is developing a flexion contracture. She has discomfort in the legs, this is unassociated with spasm. The patient returns for a baclofen pump refill.  Baclofen pump refill note  The baclofen pump site was cleaned with Betadine solution. A 21-gauge needle was inserted into the pump port site. Approximately 3 cc of residual baclofen was removed, the pump indicates a 1.6 cc residual. 20 cc of replacement baclofen was placed into the pump at 500 mcg/cc concentration.  The pump was reprogrammed for the following settings: The pump settings were maintained at a simple continuous rate of 109.97 g per day.  The alarm volume is set at 1.0 cc. The next alarm date is 05/06/2016.  The patient tolerated the procedure well. There were no complications of the above procedure.  The [place] number is [id number] The baclofen expiration date is July 2019. The baclofen lot number is [id number].

## 2016-02-10 NOTE — Progress Notes (Signed)
Please refer to baclofen pump procedure note. 

## 2016-02-12 ENCOUNTER — Other Ambulatory Visit: Payer: Self-pay | Admitting: Internal Medicine

## 2016-02-12 ENCOUNTER — Telehealth: Payer: Self-pay | Admitting: Internal Medicine

## 2016-02-12 MED ORDER — MORPHINE SULFATE (CONCENTRATE) 10 MG /0.5 ML PO SOLN: ORAL | 0 refills | Status: DC

## 2016-02-12 MED ORDER — MORPHINE SULFATE ER 30 MG PO TBCR: EXTENDED_RELEASE_TABLET | ORAL | 0 refills | Status: DC

## 2016-02-12 NOTE — Telephone Encounter (Signed)
Form was completed and faxed, Requested medications were faxed too

## 2016-02-12 NOTE — Telephone Encounter (Signed)
Can you follow up if this was in Dr. Lupita Dawn mailbox and if it was done? thanks

## 2016-02-12 NOTE — Telephone Encounter (Signed)
Angelica Wilcox [phone] called from [place] regarding a physicians care update was sent yesterday. She wanted to know if the new Rx was faxed to pt pharmacy? Medication are MS contin 30 mg in the am 60mg  at bedtime 45 tablets.   [id number] [place], [place] you!

## 2016-02-16 ENCOUNTER — Telehealth: Payer: Self-pay | Admitting: Internal Medicine

## 2016-02-16 NOTE — Telephone Encounter (Signed)
Called and LM for Pamala Hurry to resend orders

## 2016-02-16 NOTE — Telephone Encounter (Signed)
Angelica Wilcox [id number] called from [facility] regarding checking the status on orders for medication of Fluconazole. Please advise?  Thank you!

## 2016-02-16 NOTE — Telephone Encounter (Signed)
Do you know anything about this? 

## 2016-02-18 NOTE — Telephone Encounter (Signed)
Faxed to hospice °

## 2016-02-20 ENCOUNTER — Ambulatory Visit (INDEPENDENT_AMBULATORY_CARE_PROVIDER_SITE_OTHER): Payer: Medicare Other | Admitting: Internal Medicine

## 2016-02-20 ENCOUNTER — Encounter: Payer: Self-pay | Admitting: Internal Medicine

## 2016-02-20 VITALS — BP 120/76 | HR 90 | Temp 97.6°F

## 2016-02-20 DIAGNOSIS — R4189 Other symptoms and signs involving cognitive functions and awareness: Secondary | ICD-10-CM | POA: Diagnosis not present

## 2016-02-20 DIAGNOSIS — E559 Vitamin D deficiency, unspecified: Secondary | ICD-10-CM

## 2016-02-20 DIAGNOSIS — G8929 Other chronic pain: Secondary | ICD-10-CM

## 2016-02-20 DIAGNOSIS — E038 Other specified hypothyroidism: Secondary | ICD-10-CM | POA: Diagnosis not present

## 2016-02-20 DIAGNOSIS — H6121 Impacted cerumen, right ear: Secondary | ICD-10-CM | POA: Diagnosis not present

## 2016-02-20 DIAGNOSIS — K5901 Slow transit constipation: Secondary | ICD-10-CM | POA: Diagnosis not present

## 2016-02-20 DIAGNOSIS — G8250 Quadriplegia, unspecified: Secondary | ICD-10-CM

## 2016-02-20 DIAGNOSIS — K5909 Other constipation: Secondary | ICD-10-CM | POA: Diagnosis not present

## 2016-02-20 DIAGNOSIS — E871 Hypo-osmolality and hyponatremia: Secondary | ICD-10-CM

## 2016-02-20 LAB — COMPREHENSIVE METABOLIC PANEL
ALBUMIN: 4.1 g/dL (ref 3.5–5.2)
ALT: 55 U/L — ABNORMAL HIGH (ref 0–35)
AST: 50 U/L — ABNORMAL HIGH (ref 0–37)
Alkaline Phosphatase: 84 U/L (ref 39–117)
BUN: 7 mg/dL (ref 6–23)
CALCIUM: 9.4 mg/dL (ref 8.4–10.5)
CHLORIDE: 99 meq/L (ref 96–112)
CO2: 29 mEq/L (ref 19–32)
Creatinine, Ser: 0.5 mg/dL (ref 0.40–1.20)
GFR: 129.81 mL/min (ref >60.00)
Glucose, Bld: 88 mg/dL (ref 70–99)
POTASSIUM: 3.8 meq/L (ref 3.5–5.1)
SODIUM: 139 meq/L (ref 135–145)
Total Bilirubin: 0.5 mg/dL (ref 0.2–1.2)
Total Protein: 7.7 g/dL (ref 6.0–8.3)

## 2016-02-20 LAB — VITAMIN D 25 HYDROXY (VIT D DEFICIENCY, FRACTURES): VITD: 50.4 ng/mL (ref 30.00–100.00)

## 2016-02-20 LAB — TSH: TSH: 7.28 u[IU]/mL — ABNORMAL HIGH (ref 0.35–4.50)

## 2016-02-20 LAB — MAGNESIUM: MAGNESIUM: 2.1 mg/dL (ref 1.5–2.5)

## 2016-02-20 NOTE — Progress Notes (Signed)
Pre visit review using our clinic review tool, if applicable. No additional management support is needed unless otherwise documented below in the visit note. 

## 2016-02-20 NOTE — Progress Notes (Signed)
Subjective:  Patient ID: Angelica Wilcox, female    DOB: May 30, 1946  Age: 70 y.o. MRN: [id number]  CC: The primary encounter diagnosis was Cerumen impaction, right. Diagnoses of Other constipation, Slow transit constipation, Cognitive deficits, Hyponatremia, Vitamin D deficiency, Spastic tetraplegia with rigidity syndrome ([place]), and Chronic pain were also pertinent to this visit.  HPI LEVA KAMINSKI presents for follow up on MS and related issues  Last seen January 2017.  Has been on Hospice who has changed her narcotics from oxycontin to  MS Contin twice daily,  The transition was on sept 20th because she resisted the change for a week.  Not sleeping at night for the past 2 nights,  Not due to pain . just wide awake,  Sleeping  during the day   Took additional  Medication for breakthrough pain yesterday using  oral solution of morphine , wants to try MS IR    Memory is becoming more impaired.  More dependent on caregivers.  Denies depression,  Just not as enthused.  .    Getting a brace for right arm contraction bc arm starts to choke her at night . Marland Kitchen  Had pump refilled last week  Sept 12   No bed sores   Some inguinal redness on the left side,  Managed with nystatin powder and cream . .  Appetite good  Gaining weight .  Chronic constipation requires manual disimpaction daily Angela the nighttime caregiv r is doing the disimpaction.  She prefers to avoid lactulose and laxatives  because it makes her stools too soft.  Stools are very large in diameterrefuses to use laxatives.  Eats a high fiber diet.    Right canal occluded    Outpatient Medications Prior to Visit  Medication Sig Dispense Refill  . BACLOFEN IT 65.07 mcg model No. [id number] implant date 09/22/11 At [facility]    . Cholecalciferol (VITAMIN D3) 2000 UNITS TABS Take 1 tablet by mouth daily.     . DULoxetine (CYMBALTA) 60 MG capsule TAKE 1 CAPSULE BY MOUTH AT BEDTIME 30 capsule 1  . furosemide (LASIX) 20 MG tablet TAKE 1  TABLET (20 MG TOTAL) BY MOUTH DAILY. 30 tablet 5  . gabapentin (NEURONTIN) 600 MG tablet TAKE 1 TABLET BY MOUTH TWICE A DAY (Patient taking differently: one tablet three times a day) 60 tablet 6  . Infant Care Products (DERMACLOUD) CREA Apply to affected areas 2-3 times a day. 430 g 2  . lisinopril (PRINIVIL,ZESTRIL) 20 MG tablet TAKE 1 TABLET BY MOUTH EVERY DAY 90 tablet 1  . LORazepam (ATIVAN) 0.5 MG tablet Take 1 tablet (0.5 mg total) by mouth daily as needed. 30 tablet 3  . Misc. Devices [facility]) MISC 1 each by Other route.    Marland Kitchen morphine (MS CONTIN) 30 MG 12 hr tablet 1 tablet in the morning and 2 tablets at bedtime 45 tablet 0  . Morphine Sulfate (MORPHINE CONCENTRATE) 10 mg / 0.5 ml concentrated solution TAKE 0.5 ML  BY MOUTH EVERY 4 HOURS AS NEEDED FOR BREAKTHROUGH PAIN 15 mL 0  . NON FORMULARY 500 mcg by Other route.    . nystatin cream (MYCOSTATIN) Reported on 08/13/2015  0  . OnabotulinumtoxinA (BOTOX IJ) Inject 300 Units as directed.     No facility-administered medications prior to visit.     Review of Systems;  Patient denies headache, fevers, malaise, unintentional weight loss, skin rash, eye pain, sinus congestion and sinus pain, sore throat, dysphagia,  hemoptysis , cough, dyspnea, wheezing,  chest pain, palpitations, orthopnea, edema, abdominal pain, nausea, melena, diarrhea, constipation, flank pain, dysuria, hematuria, urinary  Frequency, nocturia, numbness, tingling, seizures,  Focal weakness, Loss of consciousness,  Tremor, insomnia, depression, anxiety, and suicidal ideation.      Objective:  BP 120/76   Pulse 90   Temp 97.6 F (36.4 C) (Oral)   SpO2 92%   BP Readings from Last 3 Encounters:  02/20/16 120/76  02/10/16 122/80  11/18/15 130/82    Wt Readings from Last 3 Encounters:  02/10/16 150 lb (68 kg)  11/18/15 150 lb (68 kg)  01/01/14 150 lb (68 kg)    General appearance: alert, cooperative and appears stated age Ears: right canal occluded with  cerumen,   Normal  Left external ear canal Throat: lips, mucosa, and tongue normal; teeth and gums normal Neck: no adenopathy, no carotid bruit, supple, symmetrical, trachea midline and thyroid not enlarged, symmetric, no tenderness/mass/nodules Back: symmetric, no curvature. ROM normal. No CVA tenderness. Lungs: clear to auscultation bilaterally Heart: regular rate and rhythm, S1, S2 normal, no murmur, click, rub or gallop Abdomen: soft, non-tender; bowel sounds normal; no masses,  no organomegaly Pulses: 2+ and symmetric Skin: Skin color, texture, turgor normal. No rashes or lesions Lymph nodes: Cervical, supraclavicular, and axillary nodes normal.  No results found for: HGBA1C  Lab Results  Component Value Date   CREATININE 0.50 02/20/2016   CREATININE 0.57 09/18/2014   CREATININE 0.5 09/19/2013    Lab Results  Component Value Date   WBC 8.9 09/19/2013   HGB 13.6 09/19/2013   HCT 40.4 09/19/2013   PLT 282.0 09/19/2013   GLUCOSE 88 02/20/2016   CHOL 285 (H) 07/28/2011   TRIG 206.0 (H) 07/28/2011   HDL 49.10 07/28/2011   LDLDIRECT 249.6 08/24/2012   ALT 55 (H) 02/20/2016   AST 50 (H) 02/20/2016   NA 139 02/20/2016   K 3.8 02/20/2016   CL 99 02/20/2016   CREATININE 0.50 02/20/2016   BUN 7 02/20/2016   CO2 29 02/20/2016   TSH 7.28 (H) 02/20/2016    No results found.  Assessment & Plan:   Problem List Items Addressed This Visit    Chronic pain    Managed with increased gabapentin dose of 300 mg tid , cymbalta, and now  morphine per Hospice      Constipation    Requiring daily disimpaction by caregivers. She has avoided use of laxatives due to concern for uncontrolled stooling.  Advised her to start daily dulcolax given use of narcotics. 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 Meine maintain her Vitamin D3, BACLOFEN IT, LORazepam, DERMACLOUD, Wheelchair, NON FORMULARY, OnabotulinumtoxinA (BOTOX IJ), furosemide, gabapentin, nystatin cream, DULoxetine, lisinopril, morphine, and morphine CONCENTRATE.  No orders of the defined types were placed in this encounter.   There are no discontinued medications.  Follow-up: No Follow-up on file.   Crecencio Mc, MD

## 2016-02-20 NOTE — Patient Instructions (Signed)
Consisder  5 mg dulcolax  At lunch time So Levada Dy can help you at night have a BM .

## 2016-02-21 DIAGNOSIS — H6120 Impacted cerumen, unspecified ear: Secondary | ICD-10-CM | POA: Insufficient documentation

## 2016-02-21 NOTE — Assessment & Plan Note (Signed)
Irrigation of right ear ordered  nd done today with good results

## 2016-02-21 NOTE — Assessment & Plan Note (Signed)
Requiring a brace for right arm contraction.  Legs must be tension banded to keep them on her motorized wheelchair.

## 2016-02-21 NOTE — Assessment & Plan Note (Addendum)
Managed with increased gabapentin dose of 300 mg tid , cymbalta, and now  morphine per Hospice

## 2016-02-21 NOTE — Assessment & Plan Note (Signed)
Requiring daily disimpaction by caregivers. She has avoided use of laxatives due to concern for uncontrolled stooling.  Advised her to start daily dulcolax given use of narcotics. 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

## 2016-02-22 ENCOUNTER — Other Ambulatory Visit: Payer: Self-pay | Admitting: Internal Medicine

## 2016-02-22 ENCOUNTER — Encounter: Payer: Self-pay | Admitting: Internal Medicine

## 2016-02-22 DIAGNOSIS — R748 Abnormal levels of other serum enzymes: Secondary | ICD-10-CM

## 2016-02-22 DIAGNOSIS — E038 Other specified hypothyroidism: Secondary | ICD-10-CM

## 2016-02-22 MED ORDER — LEVOTHYROXINE SODIUM 50 MCG PO TABS: 25.0000 ug | ORAL_TABLET | Freq: Every day | ORAL | 2 refills | Status: DC

## 2016-02-24 ENCOUNTER — Telehealth: Payer: Self-pay | Admitting: Internal Medicine

## 2016-02-24 MED ORDER — MORPHINE SULFATE (CONCENTRATE) 10 MG /0.5 ML PO SOLN: ORAL | 0 refills | Status: DC

## 2016-02-24 NOTE — Telephone Encounter (Signed)
morphine refilled

## 2016-02-24 NOTE — Telephone Encounter (Signed)
Refilled 02/12/16. Pt last seen 02/20/16. Please advise?

## 2016-02-24 NOTE — Telephone Encounter (Signed)
Pt needs a refill on Morphine Sulfate (MORPHINE CONCENTRATE) 10 mg / 0.5 ml.. Please advise.. [place] said she was completely out and will need for tonight.

## 2016-02-29 ENCOUNTER — Other Ambulatory Visit: Payer: Self-pay | Admitting: Internal Medicine

## 2016-02-29 DIAGNOSIS — F341 Dysthymic disorder: Secondary | ICD-10-CM | POA: Diagnosis not present

## 2016-02-29 DIAGNOSIS — G35 Multiple sclerosis: Secondary | ICD-10-CM | POA: Diagnosis not present

## 2016-02-29 DIAGNOSIS — N99511 Cystostomy infection: Secondary | ICD-10-CM | POA: Diagnosis not present

## 2016-02-29 DIAGNOSIS — I10 Essential (primary) hypertension: Secondary | ICD-10-CM | POA: Diagnosis not present

## 2016-02-29 DIAGNOSIS — K5732 Diverticulitis of large intestine without perforation or abscess without bleeding: Secondary | ICD-10-CM | POA: Diagnosis not present

## 2016-02-29 DIAGNOSIS — Z435 Encounter for attention to cystostomy: Secondary | ICD-10-CM | POA: Diagnosis not present

## 2016-02-29 DIAGNOSIS — G894 Chronic pain syndrome: Secondary | ICD-10-CM | POA: Diagnosis not present

## 2016-03-02 NOTE — Telephone Encounter (Signed)
Found in LPN for Tullo's basket, re-faxed. thanks

## 2016-03-02 NOTE — Telephone Encounter (Signed)
This RX needs to be resent to the pharmacy, it was not received. thanks

## 2016-03-03 ENCOUNTER — Telehealth: Payer: Self-pay | Admitting: *Deleted

## 2016-03-03 MED ORDER — MORPHINE SULFATE ER 30 MG PO TBCR
EXTENDED_RELEASE_TABLET | ORAL | 0 refills | Status: DC
Start: 2016-03-03 — End: 2016-03-16

## 2016-03-03 NOTE — Telephone Encounter (Signed)
Left a voice message with scheduled appt time and date

## 2016-03-03 NOTE — Telephone Encounter (Addendum)
Last filled 02/12/16

## 2016-03-03 NOTE — Telephone Encounter (Signed)
We could use 4.30 that day on a Tuesday.

## 2016-03-03 NOTE — Telephone Encounter (Signed)
Refilled for faxing tonight

## 2016-03-03 NOTE — Telephone Encounter (Signed)
Pt 's daughter requested that pt be seen by Dr.Tullo to discuss her death, due to her MS. Her daughter request an appt on 03/09/16 because this will be th only day she will be in town to take her  Contact [phone]

## 2016-03-03 NOTE — Telephone Encounter (Signed)
[facility] has requested a medication refill for MS contin 30 mg  Pharmacy [place] @[phone] when filled

## 2016-03-03 NOTE — Telephone Encounter (Signed)
Script faxed hospice notified.

## 2016-03-03 NOTE — Telephone Encounter (Signed)
4:30 ON  Tuesday Oct 10th

## 2016-03-04 ENCOUNTER — Telehealth: Payer: Self-pay | Admitting: Internal Medicine

## 2016-03-04 NOTE — Telephone Encounter (Signed)
Pt's daughter Laurin Coder called and stated that she spoke with someone yesterday and left a message this morning. Pt would like to come in for an appt with Dr. Derrel Nip to discuss what pt's last days will be like. Daughter stated they spoke with someone about coming in for Tuesday. Please advise, thank you.  Call daughter Deneise Lever [phone]

## 2016-03-04 NOTE — Telephone Encounter (Signed)
Spoke with daughter and confirmed the appt.

## 2016-03-09 ENCOUNTER — Encounter: Payer: Self-pay | Admitting: Internal Medicine

## 2016-03-09 ENCOUNTER — Ambulatory Visit (INDEPENDENT_AMBULATORY_CARE_PROVIDER_SITE_OTHER): Payer: Medicare Other | Admitting: Internal Medicine

## 2016-03-09 DIAGNOSIS — Z7189 Other specified counseling: Secondary | ICD-10-CM | POA: Diagnosis not present

## 2016-03-09 NOTE — Patient Instructions (Signed)
It was nice meeting you again, Angelica Wilcox  Please sign up for [employer] account

## 2016-03-09 NOTE — Progress Notes (Signed)
Subjective:  Patient ID: Angelica Wilcox, female    DOB: 1945/12/27  Age: 70 y.o. MRN: [id number]  CC: The encounter diagnosis was Counseling regarding end of life decision making.  HPI Angelica Wilcox presents for follow up on multiple issues including request for end of life discussion  She is accompanied by her daughter Angelica Wilcox from [place].  Patient continues to experience daily pain and muscle spasm managed with morphine,  She has been noting some loss of cognitive function with regard to short term memory , but her daughter, who is a Radio producer, disagrees.  She has observed decreased concentration  Focus with increased distractibility.  She has developed new onset right sided facial numbness affecting the forehead , cheek and chin.  Her swallow function and muscle control of cheek and eyelid have not been affected.  Patient is under the care of hospice.  She is concerned about the gradual loss of function she is experiencing and understands that her condition is irreversible. She is requesting revision of her  MOST form to eliminate the order for IV fluids to be given.  She is very clearly wanting no .prlongation of the end of her life, even if she develops and acute illness that could be cured.  She is aware that failure to treat dehydration of pneumonia will hasten her death.  Her daughter is in agreement with her mother's end of lfe directives.     Outpatient Medications Prior to Visit  Medication Sig Dispense Refill  . BACLOFEN IT 65.07 mcg model No. [id number] implant date 09/22/11 At [facility]    . Cholecalciferol (VITAMIN D3) 2000 UNITS TABS Take 1 tablet by mouth daily.     . DULoxetine (CYMBALTA) 60 MG capsule TAKE 1 CAPSULE BY MOUTH AT BEDTIME 30 capsule 1  . furosemide (LASIX) 20 MG tablet TAKE 1 TABLET (20 MG TOTAL) BY MOUTH DAILY. 30 tablet 5  . gabapentin (NEURONTIN) 600 MG tablet TAKE 1 TABLET BY MOUTH TWICE A DAY (Patient taking differently: one tablet three times a day) 60  tablet 6  . Infant Care Products (DERMACLOUD) CREA Apply to affected areas 2-3 times a day. 430 g 2  . ipratropium-albuterol (DUONEB) 0.5-2.5 (3) MG/3ML SOLN USE 1 VIAL EVERY 6 HOURS AS NEEDED 180 mL 3  . levothyroxine (SYNTHROID) 50 MCG tablet Take 0.5 tablets (25 mcg total) by mouth daily before breakfast. 30 tablet 2  . LORazepam (ATIVAN) 0.5 MG tablet Take 1 tablet (0.5 mg total) by mouth daily as needed. 30 tablet 3  . Misc. Devices [facility]) MISC 1 each by Other route.    Marland Kitchen morphine (MS CONTIN) 30 MG 12 hr tablet 1 tablet in the morning and 2 tablets at bedtime 45 tablet 0  . Morphine Sulfate (MORPHINE CONCENTRATE) 10 mg / 0.5 ml concentrated solution TAKE 0.5 ML  BY MOUTH EVERY 4 HOURS AS NEEDED FOR BREAKTHROUGH PAIN 15 mL 0  . NON FORMULARY 500 mcg by Other route.    . nystatin cream (MYCOSTATIN) Reported on 08/13/2015  0  . lisinopril (PRINIVIL,ZESTRIL) 20 MG tablet TAKE 1 TABLET BY MOUTH EVERY DAY (Patient not taking: Reported on 03/09/2016) 90 tablet 1  . OnabotulinumtoxinA (BOTOX IJ) Inject 300 Units as directed.     No facility-administered medications prior to visit.     Review of Systems;  Patient denies headache, fevers, malaise, unintentional weight loss, skin rash, eye pain, sinus congestion and sinus pain, sore throat, dysphagia,  hemoptysis , cough, dyspnea, wheezing, chest pain, palpitations,  orthopnea, edema, abdominal pain, nausea, melena, diarrhea, constipation, flank pain, dysuria, hematuria, urinary  Frequency, nocturia, numbness, tingling, seizures,  Focal weakness, Loss of consciousness,  Tremor, insomnia, depression, anxiety, and suicidal ideation.      Objective:  BP (!) 154/88   Pulse 88   Temp 98.1 F (36.7 C) (Oral)   Resp 10   SpO2 96%   BP Readings from Last 3 Encounters:  03/09/16 (!) 154/88  02/20/16 120/76  02/10/16 122/80    Wt Readings from Last 3 Encounters:  02/10/16 150 lb (68 kg)  11/18/15 150 lb (68 kg)  01/01/14 150 lb (68 kg)     General appearance: alert, cooperative and appears stated age Ears: normal TM's and external ear canals both ears Throat: lips, mucosa, and tongue normal; teeth and gums normal Neck: no adenopathy, no carotid bruit, supple, symmetrical, trachea midline and thyroid not enlarged, symmetric, no tenderness/mass/nodules Back: symmetric, no curvature. ROM normal. No CVA tenderness. Lungs: clear to auscultation bilaterally Heart: regular rate and rhythm, S1, S2 normal, no murmur, click, rub or gallop Abdomen: soft, non-tender; bowel sounds normal; no masses,  no organomegaly Pulses: 2+ and symmetric Skin: Skin color, texture, turgor normal. No rashes or lesions Lymph nodes: Cervical, supraclavicular, and axillary nodes normal.  No results found for: HGBA1C  Lab Results  Component Value Date   CREATININE 0.50 02/20/2016   CREATININE 0.57 09/18/2014   CREATININE 0.5 09/19/2013    Lab Results  Component Value Date   WBC 8.9 09/19/2013   HGB 13.6 09/19/2013   HCT 40.4 09/19/2013   PLT 282.0 09/19/2013   GLUCOSE 88 02/20/2016   CHOL 285 (H) 07/28/2011   TRIG 206.0 (H) 07/28/2011   HDL 49.10 07/28/2011   LDLDIRECT 249.6 08/24/2012   ALT 55 (H) 02/20/2016   AST 50 (H) 02/20/2016   NA 139 02/20/2016   K 3.8 02/20/2016   CL 99 02/20/2016   CREATININE 0.50 02/20/2016   BUN 7 02/20/2016   CO2 29 02/20/2016   TSH 7.28 (H) 02/20/2016    No results found.  Assessment & Plan:   Problem List Items Addressed This Visit    Counseling regarding end of life decision making    40 minutes was spent with patient and daughter discussing her current state and rate of deterioration and  revising her MOST form per her request.        Other Visit Diagnoses   None.   A total of 40 minutes was spent with patient more than half of which was spent in counseling patient on the above mentioned issues , reviewing and explaining recent labs and imaging studies done, and coordination of care.  I  have discontinued Angelica Wilcox's OnabotulinumtoxinA (BOTOX IJ). I am also having her maintain her Vitamin D3, BACLOFEN IT, LORazepam, DERMACLOUD, Wheelchair, NON FORMULARY, furosemide, gabapentin, nystatin cream, DULoxetine, lisinopril, levothyroxine, morphine CONCENTRATE, ipratropium-albuterol, and morphine.  No orders of the defined types were placed in this encounter.   Medications Discontinued During This Encounter  Medication Reason  . OnabotulinumtoxinA (BOTOX IJ) Completed Course    Follow-up: No Follow-up on file.   Crecencio Mc, MD

## 2016-03-09 NOTE — Progress Notes (Signed)
Pre-visit discussion using our clinic review tool. No additional management support is needed unless otherwise documented below in the visit note.  

## 2016-03-09 NOTE — Telephone Encounter (Signed)
Mailed unread message to patient.  

## 2016-03-11 NOTE — Assessment & Plan Note (Signed)
40 minutes was spent with patient and daughter discussing her current state and rate of deterioration and  revising her MOST form per her request.

## 2016-03-16 ENCOUNTER — Other Ambulatory Visit: Payer: Self-pay | Admitting: *Deleted

## 2016-03-16 ENCOUNTER — Telehealth: Payer: Self-pay | Admitting: Internal Medicine

## 2016-03-16 MED ORDER — MORPHINE SULFATE ER 30 MG PO TBCR: EXTENDED_RELEASE_TABLET | ORAL | 0 refills | Status: DC

## 2016-03-16 MED ORDER — FLUCONAZOLE 150 MG PO TABS: 150.0000 mg | ORAL_TABLET | Freq: Every day | ORAL | 1 refills | Status: DC

## 2016-03-16 NOTE — Telephone Encounter (Signed)
Refilled,   flucozole sent

## 2016-03-16 NOTE — Telephone Encounter (Signed)
Angelica Wilcox [id number] called from [facility] regarding pt has a thick vaginal discharge and needing something ordered for a yeast infection. Need a refill for MS contin 30 mg. Thank you!  [id number] [name], [place]

## 2016-03-16 NOTE — Telephone Encounter (Signed)
Fill on MS contin was 03/03/16 ok to fill medication pended please see note from hospice nurse patient vaginal discharge.

## 2016-03-25 ENCOUNTER — Telehealth: Payer: Self-pay | Admitting: *Deleted

## 2016-03-25 NOTE — Telephone Encounter (Signed)
Notified hospice nurse.

## 2016-03-25 NOTE — Telephone Encounter (Signed)
She can stay off it utnil next week since she has been taking too much

## 2016-03-25 NOTE — Telephone Encounter (Signed)
[facility] stated that pt has been taking whole tablet levothyroxine vs a half tablet, she is now out of this medications and pharmacy will not refill the Rx. Pt will need a refill for this medication Contact [phone]

## 2016-03-25 NOTE — Telephone Encounter (Signed)
Patient took whole pill of levothyroxine instead of 0.5 tablet  Pharmacy will not refill until next week can patient stay off since she was taking to much until time for refill or should I send script for 25 mcg?

## 2016-03-30 ENCOUNTER — Telehealth: Payer: Self-pay | Admitting: Internal Medicine

## 2016-03-30 MED ORDER — MORPHINE SULFATE ER 30 MG PO TBCR: EXTENDED_RELEASE_TABLET | ORAL | 0 refills | Status: DC

## 2016-03-30 NOTE — Telephone Encounter (Signed)
Last fill 03/16/16 45 tablets , 2 week supply.

## 2016-03-30 NOTE — Telephone Encounter (Signed)
Script faxed.

## 2016-03-30 NOTE — Telephone Encounter (Signed)
Refilled

## 2016-03-30 NOTE — Telephone Encounter (Signed)
Angelica Wilcox from [facility] called. Pt needs another refill on her morphine (MS CONTIN) 30 MG 12 hr tablet.

## 2016-03-31 DIAGNOSIS — G894 Chronic pain syndrome: Secondary | ICD-10-CM | POA: Diagnosis not present

## 2016-03-31 DIAGNOSIS — I10 Essential (primary) hypertension: Secondary | ICD-10-CM | POA: Diagnosis not present

## 2016-03-31 DIAGNOSIS — N99511 Cystostomy infection: Secondary | ICD-10-CM | POA: Diagnosis not present

## 2016-03-31 DIAGNOSIS — K5732 Diverticulitis of large intestine without perforation or abscess without bleeding: Secondary | ICD-10-CM | POA: Diagnosis not present

## 2016-03-31 DIAGNOSIS — G35 Multiple sclerosis: Secondary | ICD-10-CM | POA: Diagnosis not present

## 2016-03-31 DIAGNOSIS — Z435 Encounter for attention to cystostomy: Secondary | ICD-10-CM | POA: Diagnosis not present

## 2016-03-31 DIAGNOSIS — F341 Dysthymic disorder: Secondary | ICD-10-CM | POA: Diagnosis not present

## 2016-04-02 ENCOUNTER — Telehealth: Payer: Self-pay | Admitting: *Deleted

## 2016-04-02 MED ORDER — MORPHINE SULFATE ER 30 MG PO TBCR: EXTENDED_RELEASE_TABLET | ORAL | 0 refills | Status: DC

## 2016-04-02 NOTE — Telephone Encounter (Signed)
Angelica Wilcox from [facility] However, she only needed a verbal consent to change her direction to TID. Rx will stay on file.

## 2016-04-02 NOTE — Telephone Encounter (Signed)
Angelica Wilcox from [facility] stated that pt is currently taking MS Contin 30 mg in the morning and 60 mg at night,every 12 hrs. Patient requested to have doses scheduled for every 8 hrs .

## 2016-04-02 NOTE — Telephone Encounter (Signed)
Refill for one tablet every 8 hours #90

## 2016-04-15 ENCOUNTER — Telehealth: Payer: Self-pay | Admitting: Neurology

## 2016-04-15 NOTE — Telephone Encounter (Signed)
Can r/s to Monday 12/4 @ noon.

## 2016-04-15 NOTE — Telephone Encounter (Signed)
PT CONFIRMED 12/7 BACLOFEN PUMP REFILL APPT CHANGED R/S TO 12/8 AT NOON.

## 2016-04-15 NOTE — Telephone Encounter (Signed)
Baclofen Pump Refill appt 12/7 needs R/S due to [place] out of the office that date. When would you like her new appt to be?

## 2016-04-16 ENCOUNTER — Other Ambulatory Visit: Payer: Self-pay | Admitting: Internal Medicine

## 2016-04-16 NOTE — Telephone Encounter (Signed)
Angelica Wilcox from [facility] called and needs an rx for morphine (MS CONTIN) 30 MG 12 hr tablet for 45 tablets.   [id number] - , [place]  Call [phone]

## 2016-04-16 NOTE — Telephone Encounter (Signed)
Last fill 04/02/16

## 2016-04-19 MED ORDER — MORPHINE SULFATE (CONCENTRATE) 10 MG /0.5 ML PO SOLN: ORAL | 0 refills | Status: DC

## 2016-04-19 MED ORDER — MORPHINE SULFATE ER 30 MG PO TBCR: EXTENDED_RELEASE_TABLET | ORAL | 0 refills | Status: DC

## 2016-04-19 NOTE — Telephone Encounter (Signed)
Crystal from [facility] called and stated that patient has only one pill left of morphine (MS CONTIN) 30 MG 12 hr tablet and that will be taken at lunch time. Pt takes medication 3x a day.  Call [place]  @ [phone]

## 2016-04-19 NOTE — Telephone Encounter (Signed)
printed

## 2016-04-19 NOTE — Telephone Encounter (Signed)
Both scripts faxed as requested caregiver notified.

## 2016-04-19 NOTE — Telephone Encounter (Signed)
Caregiver called stating that pt has took her last pill and needs to take the next within 4 hours.. Please advise... [phone]

## 2016-04-30 ENCOUNTER — Telehealth: Payer: Self-pay | Admitting: Internal Medicine

## 2016-04-30 DIAGNOSIS — N99511 Cystostomy infection: Secondary | ICD-10-CM | POA: Diagnosis not present

## 2016-04-30 DIAGNOSIS — K5732 Diverticulitis of large intestine without perforation or abscess without bleeding: Secondary | ICD-10-CM | POA: Diagnosis not present

## 2016-04-30 DIAGNOSIS — Z435 Encounter for attention to cystostomy: Secondary | ICD-10-CM | POA: Diagnosis not present

## 2016-04-30 DIAGNOSIS — I10 Essential (primary) hypertension: Secondary | ICD-10-CM | POA: Diagnosis not present

## 2016-04-30 DIAGNOSIS — F341 Dysthymic disorder: Secondary | ICD-10-CM | POA: Diagnosis not present

## 2016-04-30 DIAGNOSIS — G35 Multiple sclerosis: Secondary | ICD-10-CM | POA: Diagnosis not present

## 2016-04-30 DIAGNOSIS — G894 Chronic pain syndrome: Secondary | ICD-10-CM | POA: Diagnosis not present

## 2016-04-30 MED ORDER — MORPHINE SULFATE ER 30 MG PO TBCR: EXTENDED_RELEASE_TABLET | ORAL | 0 refills | Status: DC

## 2016-04-30 NOTE — Telephone Encounter (Signed)
Last filled 04/19/16 efill printed in quick sign

## 2016-04-30 NOTE — Telephone Encounter (Signed)
Clarene Critchley from [facility] called requesting a refill on pt's morphine (MS CONTIN) 30 MG 12 hr tablet.  [id number] - [place], [place]  Call pt @ [phone]

## 2016-05-01 DIAGNOSIS — F341 Dysthymic disorder: Secondary | ICD-10-CM | POA: Diagnosis not present

## 2016-05-01 DIAGNOSIS — N99511 Cystostomy infection: Secondary | ICD-10-CM | POA: Diagnosis not present

## 2016-05-01 DIAGNOSIS — G35 Multiple sclerosis: Secondary | ICD-10-CM | POA: Diagnosis not present

## 2016-05-01 DIAGNOSIS — G894 Chronic pain syndrome: Secondary | ICD-10-CM | POA: Diagnosis not present

## 2016-05-01 DIAGNOSIS — K5732 Diverticulitis of large intestine without perforation or abscess without bleeding: Secondary | ICD-10-CM | POA: Diagnosis not present

## 2016-05-01 DIAGNOSIS — I10 Essential (primary) hypertension: Secondary | ICD-10-CM | POA: Diagnosis not present

## 2016-05-02 DIAGNOSIS — K5732 Diverticulitis of large intestine without perforation or abscess without bleeding: Secondary | ICD-10-CM | POA: Diagnosis not present

## 2016-05-02 DIAGNOSIS — G894 Chronic pain syndrome: Secondary | ICD-10-CM | POA: Diagnosis not present

## 2016-05-02 DIAGNOSIS — F341 Dysthymic disorder: Secondary | ICD-10-CM | POA: Diagnosis not present

## 2016-05-02 DIAGNOSIS — I10 Essential (primary) hypertension: Secondary | ICD-10-CM | POA: Diagnosis not present

## 2016-05-02 DIAGNOSIS — G35 Multiple sclerosis: Secondary | ICD-10-CM | POA: Diagnosis not present

## 2016-05-02 DIAGNOSIS — N99511 Cystostomy infection: Secondary | ICD-10-CM | POA: Diagnosis not present

## 2016-05-03 DIAGNOSIS — K5732 Diverticulitis of large intestine without perforation or abscess without bleeding: Secondary | ICD-10-CM | POA: Diagnosis not present

## 2016-05-03 DIAGNOSIS — G35 Multiple sclerosis: Secondary | ICD-10-CM | POA: Diagnosis not present

## 2016-05-03 DIAGNOSIS — I10 Essential (primary) hypertension: Secondary | ICD-10-CM | POA: Diagnosis not present

## 2016-05-03 DIAGNOSIS — F341 Dysthymic disorder: Secondary | ICD-10-CM | POA: Diagnosis not present

## 2016-05-03 DIAGNOSIS — N99511 Cystostomy infection: Secondary | ICD-10-CM | POA: Diagnosis not present

## 2016-05-03 DIAGNOSIS — G894 Chronic pain syndrome: Secondary | ICD-10-CM | POA: Diagnosis not present

## 2016-05-04 ENCOUNTER — Ambulatory Visit (INDEPENDENT_AMBULATORY_CARE_PROVIDER_SITE_OTHER)

## 2016-05-04 ENCOUNTER — Other Ambulatory Visit: Payer: Self-pay | Admitting: Internal Medicine

## 2016-05-04 VITALS — BP 128/64 | HR 87 | Temp 98.1°F | Resp 14 | Ht 62.0 in | Wt 150.0 lb

## 2016-05-04 DIAGNOSIS — F341 Dysthymic disorder: Secondary | ICD-10-CM | POA: Diagnosis not present

## 2016-05-04 DIAGNOSIS — K5732 Diverticulitis of large intestine without perforation or abscess without bleeding: Secondary | ICD-10-CM | POA: Diagnosis not present

## 2016-05-04 DIAGNOSIS — R310 Gross hematuria: Secondary | ICD-10-CM | POA: Diagnosis not present

## 2016-05-04 DIAGNOSIS — G894 Chronic pain syndrome: Secondary | ICD-10-CM | POA: Diagnosis not present

## 2016-05-04 DIAGNOSIS — Z0000 Encounter for general adult medical examination without abnormal findings: Secondary | ICD-10-CM

## 2016-05-04 DIAGNOSIS — N99511 Cystostomy infection: Secondary | ICD-10-CM | POA: Diagnosis not present

## 2016-05-04 DIAGNOSIS — G35 Multiple sclerosis: Secondary | ICD-10-CM | POA: Diagnosis not present

## 2016-05-04 DIAGNOSIS — I10 Essential (primary) hypertension: Secondary | ICD-10-CM | POA: Diagnosis not present

## 2016-05-04 LAB — URINALYSIS, ROUTINE W REFLEX MICROSCOPIC
Bilirubin Urine: NEGATIVE
Ketones, ur: NEGATIVE
Nitrite: POSITIVE — AB
SPECIFIC GRAVITY, URINE: 1.02 (ref 1.000–1.030)
Total Protein, Urine: 30 — AB
URINE GLUCOSE: NEGATIVE
Urobilinogen, UA: 4 — AB (ref 0.0–1.0)
pH: 6 (ref 5.0–8.0)

## 2016-05-04 LAB — POCT URINALYSIS DIPSTICK
BILIRUBIN UA: NEGATIVE
GLUCOSE UA: NEGATIVE
Ketones, UA: NEGATIVE
Nitrite, UA: POSITIVE
PH UA: 5.5
Protein, UA: 100
SPEC GRAV UA: 1.02
Urobilinogen, UA: 4

## 2016-05-04 MED ORDER — NYSTATIN POWD: 0 refills | Status: AC

## 2016-05-04 NOTE — Progress Notes (Signed)
Patient was seeing denisa for wellness visit  I was asked to evaluate for new onset rash on right leg from ankle to mid thigh  Also c/o hematuria, bleeding aroudn her stoma (cystectomy)  Empiric nystatin powder and urinalysis with culture ordered  Consider cellulitis if does not resolve with nystatin

## 2016-05-04 NOTE — Progress Notes (Signed)
Subjective:   Angelica Wilcox is a 70 y.o. female who presents for an Initial Medicare Annual Wellness Visit.  Review of Systems    No ROS.  Medicare Wellness Visit.  Cardiac Risk Factors include: advanced age (>4men, >69 women);hypertension     Objective:    Today's Vitals   05/04/16 1356  BP: 128/64  Pulse: 87  Resp: 14  Temp: 98.1 F (36.7 C)  TempSrc: Oral  SpO2: 95%  Weight: 150 lb (68 kg)  Height: 5\' 2"  (1.575 m)   Body mass index is 27.44 kg/m.   Current Medications (verified) Outpatient Encounter Prescriptions as of 05/04/2016  Medication Sig  . BACLOFEN IT 65.07 mcg model No. [id number] implant date 09/22/11 At [facility]  . DULoxetine (CYMBALTA) 60 MG capsule TAKE 1 CAPSULE BY MOUTH AT BEDTIME  . furosemide (LASIX) 20 MG tablet TAKE 1 TABLET (20 MG TOTAL) BY MOUTH DAILY.  Marland Kitchen gabapentin (NEURONTIN) 600 MG tablet TAKE 1 TABLET BY MOUTH TWICE A DAY (Patient taking differently: one tablet three times a day)  . Infant Care Products (DERMACLOUD) CREA Apply to affected areas 2-3 times a day.  . levothyroxine (SYNTHROID) 50 MCG tablet Take 0.5 tablets (25 mcg total) by mouth daily before breakfast.  . LORazepam (ATIVAN) 0.5 MG tablet Take 1 tablet (0.5 mg total) by mouth daily as needed.  . Misc. Devices [facility]) MISC 1 each by Other route.  Marland Kitchen morphine (MS CONTIN) 30 MG 12 hr tablet 1 tablet every 8 hours  . Morphine Sulfate (MORPHINE CONCENTRATE) 10 mg / 0.5 ml concentrated solution TAKE 0.5 ML  BY MOUTH EVERY 4 HOURS AS NEEDED FOR BREAKTHROUGH PAIN  . NON FORMULARY 500 mcg by Other route.  . nystatin cream (MYCOSTATIN) Reported on 08/13/2015  . [DISCONTINUED] Cholecalciferol (VITAMIN D3) 2000 UNITS TABS Take 1 tablet by mouth daily.   . [DISCONTINUED] fluconazole (DIFLUCAN) 150 MG tablet Take 1 tablet (150 mg total) by mouth daily.  . [DISCONTINUED] ipratropium-albuterol (DUONEB) 0.5-2.5 (3) MG/3ML SOLN USE 1 VIAL EVERY 6 HOURS AS NEEDED   No facility-administered  encounter medications on file as of 05/04/2016.     Allergies (verified) Alendronate sodium; Atorvastatin; Bactrim; Bupropion; Ciprofloxacin; Nitrofurantoin; Pneumovax [pneumococcal polysaccharide vaccine]; Sulfamethoxazole-trimethoprim; and Influenza virus vacc split pf   History: Past Medical History:  Diagnosis Date  . Calculus of gallbladder without mention of cholecystitis or obstruction   . Contracture of hand joint   . Diverticulitis of colon (without mention of hemorrhage)(562.11)   . Dyslipidemia   . Dysthymic disorder   . Esophageal reflux   . Headache(784.0)   . Multiple sclerosis ([place])   . Nonspecific elevation of levels of transaminase or lactic acid dehydrogenase (LDH)   . Osteoporosis, unspecified   . Other abnormality of urination(788.69)   . Other alteration of consciousness   . Other and unspecified hyperlipidemia   . Other B-complex deficiencies   . Other constipation   . Other malaise and fatigue   . Rectocele   . Unspecified essential hypertension   . Unspecified urinary incontinence   . Unspecified vitamin D deficiency    Past Surgical History:  Procedure Laterality Date  . BACLOFEN PUMP REFILL  4-24/13   model No. [id number] @ [facility]  . Carotid Doppler  09/2002   Negative  . Colon Screen  1997  . DEXA  12/2008   OP, slt worse  . TONSILLECTOMY AND ADENOIDECTOMY    . TUBAL LIGATION     Family History  Problem Relation Age  of Onset  . Cancer Father   . Hypertension Father   . Alcohol abuse Father   . Heart failure Father     CHF  . Glaucoma Mother   . Pulmonary fibrosis Mother   . Stroke Daughter   . Celiac disease      sibling   Social History   Occupational History  . Disabled    Social History Main Topics  . Smoking status: Former Smoker    Quit date: 05/31/1976  . Smokeless tobacco: Never Used  . Alcohol use 0.0 oz/week     Comment: rarely drinks alcohol, The patient uses daily marijuana.  . Drug use:     Types: Marijuana  . Sexual  activity: Not on file    Tobacco Counseling Counseling given: Not Answered   Activities of Daily Living In your present state of health, do you have any difficulty performing the following activities: 05/04/2016  Hearing? N  Vision? Y  Difficulty concentrating or making decisions? Y  Walking or climbing stairs? Y  Dressing or bathing? Y  Doing errands, shopping? Y  Preparing Food and eating ? Y  Using the Toilet? Y  In the past six months, have you accidently leaked urine? N  Do you have problems with loss of bowel control? N  Managing your Medications? N  Managing your Finances? N  Housekeeping or managing your Housekeeping? Y  Some recent data might be hidden    Immunizations and Health Maintenance Immunization History  Administered Date(s) Administered  . Influenza Split 02/15/2012, 04/09/2013  . Influenza Whole 04/06/2010, 04/21/2011  . Influenza,inj,Quad PF,36+ Mos 04/17/2014, 06/12/2015  . Pneumococcal Conjugate-13 05/11/2013  . Pneumococcal Polysaccharide-23 06/12/2015  . Zoster 11/11/2011   Health Maintenance Due  Topic Date Due  . Hepatitis C Screening  03/18/1946  . TETANUS/TDAP  07/28/1965  . COLONOSCOPY  07/28/1996  . MAMMOGRAM  05/31/2006    Patient Care Team: Crecencio Mc, MD as PCP - General (Internal Medicine)  Indicate any recent Medical Services you may have received from other than [facility] providers in the past year (date may be approximate).     Assessment:   This is a routine wellness examination for Angelica Wilcox. The goal of the wellness visit is to assist the patient how to close the gaps in care and create a preventative care plan for the patient.   Taking calcium VIT D as appropriate/Osteoporosis reviewed.  Medications reviewed; taking without issues or barriers.  Safety issues reviewed; smoke detectors in the home. No firearms in the home. Wears seatbelts when driving or riding with others. No violence in the home.  No identified  risk were noted; The patient was oriented x 3; appropriate in dress and manner.  Dependent with self feeding; wheelchair bound. Home with hospice.    BMI; discussed the importance of a healthy diet, water intake and exercise.She plans to increase her water intake.  Educational material provided.  Patient Concerns: Red rash inner R leg; x1 day.   UA completed, Nystatin sent to pharmacy per PCP order.  Hearing/Vision screen Hearing Screening Comments: Passes the whisper test Vision Screening Comments: Followed by [facility] Last OV 2016 Wears glasses   Dietary issues and exercise activities discussed: Current Exercise Habits: The patient does not participate in regular exercise at present  Goals    . Increase water intake          STAY HYDRATED AND DRINK PLENTY OF WATER       Depression Screen PHQ  2/9 Scores 05/04/2016 07/26/2014  PHQ - 2 Score 0 0    Fall Risk Fall Risk  05/04/2016 07/26/2014  Falls in the past year? No No    Cognitive Function:     6CIT Screen 05/04/2016  What Year? 0 points  What month? 0 points  What time? 0 points  Count back from 20 0 points  Months in reverse 0 points  Repeat phrase 0 points  Total Score 0    Screening Tests Health Maintenance  Topic Date Due  . Hepatitis C Screening  16-Jun-1945  . TETANUS/TDAP  07/28/1965  . COLONOSCOPY  07/28/1996  . MAMMOGRAM  05/31/2006  . INFLUENZA VACCINE  08/28/2016 (Originally 12/30/2015)  . DEXA SCAN  Completed  . ZOSTAVAX  Completed  . PNA vac Low Risk Adult  Completed      Plan:    End of life planning; Advance aging; Advanced directives discussed. Copy of current HCPOA/Living Will requested.  Medicare Attestation I have personally reviewed: The patient's medical and social history Their use of alcohol, tobacco or illicit drugs Their current medications and supplements The patient's functional ability including ADLs,fall risks, home safety risks, cognitive, and hearing and  visual impairment Diet and physical activities Evidence for depression   The patient's weight, height, BMI, and visual acuity have been recorded in the chart.  I have made referrals and provided education to the patient based on review of the above and I have provided the patient with a written personalized care plan for preventive services.    During the course of the visit, Angelica Wilcox was educated and counseled about the following appropriate screening and preventive services:   Vaccines to include Pneumoccal, Influenza, Hepatitis B, Td, Zostavax, HCV  Electrocardiogram  Cardiovascular disease screening  Colorectal cancer screening  Bone density screening  Diabetes screening  Glaucoma screening  Mammography/PAP  Nutrition counseling  Smoking cessation counseling  Patient Instructions (the written plan) were given to the patient.    Varney Biles, LPN   [id number]

## 2016-05-04 NOTE — Patient Instructions (Addendum)
  Ms. Gaboriault , Thank you for taking time to come for your Medicare Wellness Visit. I appreciate your ongoing commitment to your health goals. Please review the following plan we discussed and let me know if I can assist you in the future.   FOLLOW UP WITH DR. Derrel Nip AS NEEDED.  NYSTATIN SENT TO PHARMACY.  These are the goals we discussed: Goals    . Increase water intake          STAY HYDRATED AND DRINK PLENTY OF WATER        This is a list of the screening recommended for you and due dates:  Health Maintenance  Topic Date Due  .  Hepatitis C: One time screening is recommended by Center for Disease Control  (CDC) for  adults born from [birth year] through 1964.   28-Jan-1946  . Tetanus Vaccine  07/28/1965  . Colon Cancer Screening  07/28/1996  . Mammogram  05/31/2006  . Flu Shot  08/28/2016*  . DEXA scan (bone density measurement)  Completed  . Shingles Vaccine  Completed  . Pneumonia vaccines  Completed  *Topic was postponed. The date shown is not the original due date.

## 2016-05-05 ENCOUNTER — Telehealth: Payer: Self-pay | Admitting: Internal Medicine

## 2016-05-05 DIAGNOSIS — G894 Chronic pain syndrome: Secondary | ICD-10-CM | POA: Diagnosis not present

## 2016-05-05 DIAGNOSIS — K5732 Diverticulitis of large intestine without perforation or abscess without bleeding: Secondary | ICD-10-CM | POA: Diagnosis not present

## 2016-05-05 DIAGNOSIS — F341 Dysthymic disorder: Secondary | ICD-10-CM | POA: Diagnosis not present

## 2016-05-05 DIAGNOSIS — G35 Multiple sclerosis: Secondary | ICD-10-CM | POA: Diagnosis not present

## 2016-05-05 DIAGNOSIS — N99511 Cystostomy infection: Secondary | ICD-10-CM | POA: Diagnosis not present

## 2016-05-05 DIAGNOSIS — I10 Essential (primary) hypertension: Secondary | ICD-10-CM | POA: Diagnosis not present

## 2016-05-05 NOTE — Telephone Encounter (Signed)
Have her stop the thyroid medication for a few weeks.  Once (IF) the rash resolves, resume once more and it it recurs,  We will clal it an allergy

## 2016-05-05 NOTE — Telephone Encounter (Signed)
Pt called about the rash on her right leg on the inside. Pt states it could possibly be from her Thyroid medication she just wants to take that into consideration. Please advise?  Call pt @ [phone]. Thank you!

## 2016-05-05 NOTE — Telephone Encounter (Signed)
patient notified and voiced understanding. 

## 2016-05-05 NOTE — Telephone Encounter (Signed)
Rash right leg, a little better today is lightening up does not itch, rash is more lower leg but some at the top of leg. Leg is warm to touch , started on Monday as little blotches and then spread more over lower leg. Levothyroxine only new medication induced. Patient was taking 50 mcg of levothyroxine instead of prescribed dose of  25 mcg , patient stopped taking for a few weeks and started back at the 25 mcg gram dose and then the rash developed.

## 2016-05-06 ENCOUNTER — Ambulatory Visit: Admitting: Neurology

## 2016-05-06 DIAGNOSIS — G35 Multiple sclerosis: Secondary | ICD-10-CM | POA: Diagnosis not present

## 2016-05-06 DIAGNOSIS — I10 Essential (primary) hypertension: Secondary | ICD-10-CM | POA: Diagnosis not present

## 2016-05-06 DIAGNOSIS — F341 Dysthymic disorder: Secondary | ICD-10-CM | POA: Diagnosis not present

## 2016-05-06 DIAGNOSIS — N99511 Cystostomy infection: Secondary | ICD-10-CM | POA: Diagnosis not present

## 2016-05-06 DIAGNOSIS — K5732 Diverticulitis of large intestine without perforation or abscess without bleeding: Secondary | ICD-10-CM | POA: Diagnosis not present

## 2016-05-06 DIAGNOSIS — G894 Chronic pain syndrome: Secondary | ICD-10-CM | POA: Diagnosis not present

## 2016-05-06 LAB — URINE CULTURE

## 2016-05-07 ENCOUNTER — Encounter: Payer: Self-pay | Admitting: Neurology

## 2016-05-07 ENCOUNTER — Other Ambulatory Visit: Payer: Self-pay | Admitting: Internal Medicine

## 2016-05-07 ENCOUNTER — Ambulatory Visit (INDEPENDENT_AMBULATORY_CARE_PROVIDER_SITE_OTHER): Admitting: Neurology

## 2016-05-07 VITALS — BP 135/68 | HR 105 | Ht 62.0 in | Wt 150.0 lb

## 2016-05-07 DIAGNOSIS — N99511 Cystostomy infection: Secondary | ICD-10-CM | POA: Diagnosis not present

## 2016-05-07 DIAGNOSIS — G35 Multiple sclerosis: Secondary | ICD-10-CM

## 2016-05-07 DIAGNOSIS — G8250 Quadriplegia, unspecified: Secondary | ICD-10-CM | POA: Diagnosis not present

## 2016-05-07 DIAGNOSIS — K5732 Diverticulitis of large intestine without perforation or abscess without bleeding: Secondary | ICD-10-CM | POA: Diagnosis not present

## 2016-05-07 DIAGNOSIS — F341 Dysthymic disorder: Secondary | ICD-10-CM | POA: Diagnosis not present

## 2016-05-07 DIAGNOSIS — I10 Essential (primary) hypertension: Secondary | ICD-10-CM | POA: Diagnosis not present

## 2016-05-07 DIAGNOSIS — G894 Chronic pain syndrome: Secondary | ICD-10-CM | POA: Diagnosis not present

## 2016-05-07 MED ORDER — BACLOFEN 10 MG/20ML IT SOLN
10.0000 mg | Freq: Once | INTRATHECAL | Status: AC
Start: 2016-05-07 — End: 2016-05-07
  Administered 2016-05-07: 10 mg via INTRATHECAL

## 2016-05-07 MED ORDER — AMOXICILLIN-POT CLAVULANATE 875-125 MG PO TABS: 1.0000 | ORAL_TABLET | Freq: Two times a day (BID) | ORAL | 0 refills | Status: DC

## 2016-05-07 NOTE — Progress Notes (Signed)
Gablofen 10,000 mcg/20 ml (500 mcg/ml) NDC [id number] Exp 07/19

## 2016-05-07 NOTE — Procedures (Signed)
      History:  Angelica Wilcox is a 70 year old patient with a history of multiple sclerosis with a spastic quadriparesis. The patient has had some ongoing progression of weakness in both arms, right greater than left, with a recent episode of right facial numbness. The patient has had some increasing stiffness in the legs, she does not wish to go up on the baclofen pump dose.   Baclofen pump refill note  The baclofen pump site was cleaned with Betadine solution. A 21-gauge needle was inserted into the pump port site. Approximately 1.0 cc of residual baclofen was removed, the pump indicates a 0.9 cc residual. 20 cc of replacement baclofen was placed into the pump at 500 mcg/cc concentration.  The pump was reprogrammed for the following settings: Simple continuous rate was kept at 109.97 g per day.  The alarm volume is set at 1.0 cc. The next alarm date is 08/01/2016.  The patient tolerated the procedure well. There were no complications of the above procedure.  The [place] number is [phone] The baclofen expiration date is July 2019. The baclofen lot number is [id number].

## 2016-05-08 DIAGNOSIS — G35 Multiple sclerosis: Secondary | ICD-10-CM | POA: Diagnosis not present

## 2016-05-08 DIAGNOSIS — F341 Dysthymic disorder: Secondary | ICD-10-CM | POA: Diagnosis not present

## 2016-05-08 DIAGNOSIS — I10 Essential (primary) hypertension: Secondary | ICD-10-CM | POA: Diagnosis not present

## 2016-05-08 DIAGNOSIS — K5732 Diverticulitis of large intestine without perforation or abscess without bleeding: Secondary | ICD-10-CM | POA: Diagnosis not present

## 2016-05-08 DIAGNOSIS — G894 Chronic pain syndrome: Secondary | ICD-10-CM | POA: Diagnosis not present

## 2016-05-08 DIAGNOSIS — N99511 Cystostomy infection: Secondary | ICD-10-CM | POA: Diagnosis not present

## 2016-05-09 DIAGNOSIS — K5732 Diverticulitis of large intestine without perforation or abscess without bleeding: Secondary | ICD-10-CM | POA: Diagnosis not present

## 2016-05-09 DIAGNOSIS — G35 Multiple sclerosis: Secondary | ICD-10-CM | POA: Diagnosis not present

## 2016-05-09 DIAGNOSIS — I10 Essential (primary) hypertension: Secondary | ICD-10-CM | POA: Diagnosis not present

## 2016-05-09 DIAGNOSIS — N99511 Cystostomy infection: Secondary | ICD-10-CM | POA: Diagnosis not present

## 2016-05-09 DIAGNOSIS — G894 Chronic pain syndrome: Secondary | ICD-10-CM | POA: Diagnosis not present

## 2016-05-09 DIAGNOSIS — F341 Dysthymic disorder: Secondary | ICD-10-CM | POA: Diagnosis not present

## 2016-05-10 DIAGNOSIS — N99511 Cystostomy infection: Secondary | ICD-10-CM | POA: Diagnosis not present

## 2016-05-10 DIAGNOSIS — G35 Multiple sclerosis: Secondary | ICD-10-CM | POA: Diagnosis not present

## 2016-05-10 DIAGNOSIS — F341 Dysthymic disorder: Secondary | ICD-10-CM | POA: Diagnosis not present

## 2016-05-10 DIAGNOSIS — G894 Chronic pain syndrome: Secondary | ICD-10-CM | POA: Diagnosis not present

## 2016-05-10 DIAGNOSIS — K5732 Diverticulitis of large intestine without perforation or abscess without bleeding: Secondary | ICD-10-CM | POA: Diagnosis not present

## 2016-05-10 DIAGNOSIS — I10 Essential (primary) hypertension: Secondary | ICD-10-CM | POA: Diagnosis not present

## 2016-05-11 DIAGNOSIS — G894 Chronic pain syndrome: Secondary | ICD-10-CM | POA: Diagnosis not present

## 2016-05-11 DIAGNOSIS — G35 Multiple sclerosis: Secondary | ICD-10-CM | POA: Diagnosis not present

## 2016-05-11 DIAGNOSIS — I10 Essential (primary) hypertension: Secondary | ICD-10-CM | POA: Diagnosis not present

## 2016-05-11 DIAGNOSIS — K5732 Diverticulitis of large intestine without perforation or abscess without bleeding: Secondary | ICD-10-CM | POA: Diagnosis not present

## 2016-05-11 DIAGNOSIS — F341 Dysthymic disorder: Secondary | ICD-10-CM | POA: Diagnosis not present

## 2016-05-11 DIAGNOSIS — N99511 Cystostomy infection: Secondary | ICD-10-CM | POA: Diagnosis not present

## 2016-05-11 NOTE — Progress Notes (Signed)
  I have reviewed the above information and agree with above.   Arsema Tusing, MD 

## 2016-05-12 DIAGNOSIS — K5732 Diverticulitis of large intestine without perforation or abscess without bleeding: Secondary | ICD-10-CM | POA: Diagnosis not present

## 2016-05-12 DIAGNOSIS — N99511 Cystostomy infection: Secondary | ICD-10-CM | POA: Diagnosis not present

## 2016-05-12 DIAGNOSIS — G894 Chronic pain syndrome: Secondary | ICD-10-CM | POA: Diagnosis not present

## 2016-05-12 DIAGNOSIS — G35 Multiple sclerosis: Secondary | ICD-10-CM | POA: Diagnosis not present

## 2016-05-12 DIAGNOSIS — F341 Dysthymic disorder: Secondary | ICD-10-CM | POA: Diagnosis not present

## 2016-05-12 DIAGNOSIS — I10 Essential (primary) hypertension: Secondary | ICD-10-CM | POA: Diagnosis not present

## 2016-05-13 DIAGNOSIS — G35 Multiple sclerosis: Secondary | ICD-10-CM | POA: Diagnosis not present

## 2016-05-13 DIAGNOSIS — G894 Chronic pain syndrome: Secondary | ICD-10-CM | POA: Diagnosis not present

## 2016-05-13 DIAGNOSIS — F341 Dysthymic disorder: Secondary | ICD-10-CM | POA: Diagnosis not present

## 2016-05-13 DIAGNOSIS — I10 Essential (primary) hypertension: Secondary | ICD-10-CM | POA: Diagnosis not present

## 2016-05-13 DIAGNOSIS — K5732 Diverticulitis of large intestine without perforation or abscess without bleeding: Secondary | ICD-10-CM | POA: Diagnosis not present

## 2016-05-13 DIAGNOSIS — N99511 Cystostomy infection: Secondary | ICD-10-CM | POA: Diagnosis not present

## 2016-05-14 ENCOUNTER — Encounter: Payer: Self-pay | Admitting: Internal Medicine

## 2016-05-14 ENCOUNTER — Other Ambulatory Visit: Payer: Self-pay | Admitting: Internal Medicine

## 2016-05-14 DIAGNOSIS — F341 Dysthymic disorder: Secondary | ICD-10-CM | POA: Diagnosis not present

## 2016-05-14 DIAGNOSIS — K5732 Diverticulitis of large intestine without perforation or abscess without bleeding: Secondary | ICD-10-CM | POA: Diagnosis not present

## 2016-05-14 DIAGNOSIS — N99511 Cystostomy infection: Secondary | ICD-10-CM | POA: Diagnosis not present

## 2016-05-14 DIAGNOSIS — G35 Multiple sclerosis: Secondary | ICD-10-CM | POA: Diagnosis not present

## 2016-05-14 DIAGNOSIS — I10 Essential (primary) hypertension: Secondary | ICD-10-CM | POA: Diagnosis not present

## 2016-05-14 DIAGNOSIS — G894 Chronic pain syndrome: Secondary | ICD-10-CM | POA: Diagnosis not present

## 2016-05-14 MED ORDER — MORPHINE SULFATE (CONCENTRATE) 10 MG /0.5 ML PO SOLN: ORAL | 0 refills | Status: DC

## 2016-05-14 NOTE — Telephone Encounter (Signed)
Refilled  hospice patient

## 2016-05-14 NOTE — Telephone Encounter (Signed)
Medication has been faxed

## 2016-05-14 NOTE — Telephone Encounter (Signed)
Crystal from [facility] called and stated that pt will be discharged next week for stability. That just wanted to make sure that pt has morphine (MS CONTIN) 30 MG 12 hr tablet every 8 hrs as needed and Morphine Sulfate (MORPHINE CONCENTRATE) 10 mg / 0.5 ml concentrated solution (20mg /53ml) and 0.8ml as needed. Please advise, thank you!  [id number] [place], [place]  Call pt @ [phone]

## 2016-05-14 NOTE — Telephone Encounter (Signed)
Refill request for Morphine Sulfate, last seen [id number], last filled [id number].  Please advise.

## 2016-05-15 DIAGNOSIS — K5732 Diverticulitis of large intestine without perforation or abscess without bleeding: Secondary | ICD-10-CM | POA: Diagnosis not present

## 2016-05-15 DIAGNOSIS — G35 Multiple sclerosis: Secondary | ICD-10-CM | POA: Diagnosis not present

## 2016-05-15 DIAGNOSIS — I10 Essential (primary) hypertension: Secondary | ICD-10-CM | POA: Diagnosis not present

## 2016-05-15 DIAGNOSIS — N99511 Cystostomy infection: Secondary | ICD-10-CM | POA: Diagnosis not present

## 2016-05-15 DIAGNOSIS — F341 Dysthymic disorder: Secondary | ICD-10-CM | POA: Diagnosis not present

## 2016-05-15 DIAGNOSIS — G894 Chronic pain syndrome: Secondary | ICD-10-CM | POA: Diagnosis not present

## 2016-05-16 DIAGNOSIS — G894 Chronic pain syndrome: Secondary | ICD-10-CM | POA: Diagnosis not present

## 2016-05-16 DIAGNOSIS — F341 Dysthymic disorder: Secondary | ICD-10-CM | POA: Diagnosis not present

## 2016-05-16 DIAGNOSIS — N99511 Cystostomy infection: Secondary | ICD-10-CM | POA: Diagnosis not present

## 2016-05-16 DIAGNOSIS — K5732 Diverticulitis of large intestine without perforation or abscess without bleeding: Secondary | ICD-10-CM | POA: Diagnosis not present

## 2016-05-16 DIAGNOSIS — G35 Multiple sclerosis: Secondary | ICD-10-CM | POA: Diagnosis not present

## 2016-05-16 DIAGNOSIS — I10 Essential (primary) hypertension: Secondary | ICD-10-CM | POA: Diagnosis not present

## 2016-05-18 DIAGNOSIS — K5732 Diverticulitis of large intestine without perforation or abscess without bleeding: Secondary | ICD-10-CM | POA: Diagnosis not present

## 2016-05-18 DIAGNOSIS — G35 Multiple sclerosis: Secondary | ICD-10-CM | POA: Diagnosis not present

## 2016-05-18 DIAGNOSIS — I10 Essential (primary) hypertension: Secondary | ICD-10-CM | POA: Diagnosis not present

## 2016-05-18 DIAGNOSIS — N99511 Cystostomy infection: Secondary | ICD-10-CM | POA: Diagnosis not present

## 2016-05-18 DIAGNOSIS — G894 Chronic pain syndrome: Secondary | ICD-10-CM | POA: Diagnosis not present

## 2016-05-18 DIAGNOSIS — F341 Dysthymic disorder: Secondary | ICD-10-CM | POA: Diagnosis not present

## 2016-05-18 NOTE — Telephone Encounter (Signed)
Pt called and stated that the pharmacy still does not have her medication. She needs her morphine (MS CONTIN) 30 MG 12 hr tablet, she will be out of them by noon tomorrow. Please call pt when completed. Please advise, thank you!  [id number] [place]  Call pt @ [phone]

## 2016-05-18 NOTE — Telephone Encounter (Signed)
Last fill was 04/30/16 I have pended the medication due to I cannot print.

## 2016-05-19 ENCOUNTER — Other Ambulatory Visit: Payer: Self-pay | Admitting: Internal Medicine

## 2016-05-19 ENCOUNTER — Telehealth: Payer: Self-pay | Admitting: *Deleted

## 2016-05-19 DIAGNOSIS — G35 Multiple sclerosis: Secondary | ICD-10-CM | POA: Diagnosis not present

## 2016-05-19 DIAGNOSIS — K5732 Diverticulitis of large intestine without perforation or abscess without bleeding: Secondary | ICD-10-CM | POA: Diagnosis not present

## 2016-05-19 DIAGNOSIS — I10 Essential (primary) hypertension: Secondary | ICD-10-CM | POA: Diagnosis not present

## 2016-05-19 DIAGNOSIS — N99511 Cystostomy infection: Secondary | ICD-10-CM | POA: Diagnosis not present

## 2016-05-19 DIAGNOSIS — G894 Chronic pain syndrome: Secondary | ICD-10-CM | POA: Diagnosis not present

## 2016-05-19 DIAGNOSIS — F341 Dysthymic disorder: Secondary | ICD-10-CM | POA: Diagnosis not present

## 2016-05-19 MED ORDER — MORPHINE SULFATE ER 30 MG PO TBCR
EXTENDED_RELEASE_TABLET | ORAL | 0 refills | Status: DC
Start: 2016-05-19 — End: 2016-06-02

## 2016-05-19 MED ORDER — MORPHINE SULFATE ER 30 MG PO TBCR: EXTENDED_RELEASE_TABLET | ORAL | 0 refills | Status: DC

## 2016-05-19 NOTE — Telephone Encounter (Signed)
Script faxed and patient notified.

## 2016-05-19 NOTE — Telephone Encounter (Signed)
Patient caregiver  has requested a medication refill MS contin tablets  [facility]

## 2016-05-20 ENCOUNTER — Telehealth: Payer: Self-pay | Admitting: Internal Medicine

## 2016-05-20 DIAGNOSIS — N99511 Cystostomy infection: Secondary | ICD-10-CM | POA: Diagnosis not present

## 2016-05-20 DIAGNOSIS — K5732 Diverticulitis of large intestine without perforation or abscess without bleeding: Secondary | ICD-10-CM | POA: Diagnosis not present

## 2016-05-20 DIAGNOSIS — G35 Multiple sclerosis: Secondary | ICD-10-CM | POA: Diagnosis not present

## 2016-05-20 DIAGNOSIS — I10 Essential (primary) hypertension: Secondary | ICD-10-CM | POA: Diagnosis not present

## 2016-05-20 DIAGNOSIS — F341 Dysthymic disorder: Secondary | ICD-10-CM | POA: Diagnosis not present

## 2016-05-20 DIAGNOSIS — G894 Chronic pain syndrome: Secondary | ICD-10-CM | POA: Diagnosis not present

## 2016-05-20 NOTE — Telephone Encounter (Signed)
Gregary Signs from [facility] called and is looking for a status update on an order for palliative care. I have her refaxing over the order. Please advise, thank you!  Call @ [phone]

## 2016-05-20 NOTE — Telephone Encounter (Signed)
Verified fax received by hospice.

## 2016-05-21 DIAGNOSIS — K5732 Diverticulitis of large intestine without perforation or abscess without bleeding: Secondary | ICD-10-CM | POA: Diagnosis not present

## 2016-05-21 DIAGNOSIS — G894 Chronic pain syndrome: Secondary | ICD-10-CM | POA: Diagnosis not present

## 2016-05-21 DIAGNOSIS — G35 Multiple sclerosis: Secondary | ICD-10-CM | POA: Diagnosis not present

## 2016-05-21 DIAGNOSIS — I10 Essential (primary) hypertension: Secondary | ICD-10-CM | POA: Diagnosis not present

## 2016-05-21 DIAGNOSIS — F341 Dysthymic disorder: Secondary | ICD-10-CM | POA: Diagnosis not present

## 2016-05-21 DIAGNOSIS — N99511 Cystostomy infection: Secondary | ICD-10-CM | POA: Diagnosis not present

## 2016-06-01 ENCOUNTER — Telehealth: Payer: Self-pay | Admitting: *Deleted

## 2016-06-01 NOTE — Telephone Encounter (Signed)
Patient has requested a medication refill for Ms Contin and Morphine sulfate  Pt stated that she has been discharged from [facility]

## 2016-06-02 ENCOUNTER — Encounter: Payer: Self-pay | Admitting: *Deleted

## 2016-06-02 MED ORDER — MORPHINE SULFATE ER 30 MG PO TBCR: EXTENDED_RELEASE_TABLET | ORAL | 0 refills | Status: DC

## 2016-06-02 NOTE — Telephone Encounter (Signed)
Last refill for Morphine was 05/14/16 and last fill for MS contin was 05/19/16 patient is no longer Hospice can we do month supply?

## 2016-06-02 NOTE — Telephone Encounter (Signed)
Patient notified of Ms contin 30 mg ready to be picked up

## 2016-06-02 NOTE — Telephone Encounter (Signed)
Will refill the MS Contin/  I want Hospice to produce documentation about the 100 mg /ml liquid morphine prescription that they say I wrote.

## 2016-06-02 NOTE — Telephone Encounter (Signed)
Morphine sulfate has a total of 75 ml on hand patient only uses at this time 0.5 ml for breakthrough pain at most 3 times per week, MS. Contin patient has 2 days on hand at 1 tablet every 8 hours. This is per patient. Called pharmacy and was advised by [name] that patient received Morphine concentrate at 100 mg per 1 ml and that medication comes in 30 ml bottle and this was dispensed to patient Script in Epic reads 10 mg per 0.5 ml dispense 15 ml ' pharmacy advising we faxed this script to them of 100 mg /1 ml and to dispense 30 ml no script in EPIC reads to dispense this amount.

## 2016-06-02 NOTE — Telephone Encounter (Signed)
Angelica Wilcox called back and stated that the pt is no longer a hospice pt and has been discharged. They can longer fix anything.   Call [phone]

## 2016-06-02 NOTE — Telephone Encounter (Signed)
YES, please call patient and find out how much she has left of each and how often she has been taking each

## 2016-06-03 ENCOUNTER — Telehealth: Payer: Self-pay | Admitting: Internal Medicine

## 2016-06-03 NOTE — Telephone Encounter (Signed)
Called [facility] for approval of patient MS Contin approval received quantity exception , insurance denied tier exception.

## 2016-06-03 NOTE — Telephone Encounter (Signed)
Marty Heck from [facility] called in regards to PA on pt's medication. She will be sending over some questions via fax.

## 2016-06-04 NOTE — Telephone Encounter (Signed)
Patient notified of insurance decision approved quantity, but denied tier exception. Patient also scheduled follow up for 06/21/16 to discuss care since hospice discharge. FYI

## 2016-06-21 ENCOUNTER — Ambulatory Visit (INDEPENDENT_AMBULATORY_CARE_PROVIDER_SITE_OTHER): Admitting: Internal Medicine

## 2016-06-21 ENCOUNTER — Encounter: Payer: Self-pay | Admitting: Internal Medicine

## 2016-06-21 DIAGNOSIS — R419 Unspecified symptoms and signs involving cognitive functions and awareness: Secondary | ICD-10-CM | POA: Diagnosis not present

## 2016-06-21 DIAGNOSIS — E441 Mild protein-calorie malnutrition: Secondary | ICD-10-CM

## 2016-06-21 DIAGNOSIS — Z515 Encounter for palliative care: Secondary | ICD-10-CM | POA: Diagnosis not present

## 2016-06-21 DIAGNOSIS — Z9359 Other cystostomy status: Secondary | ICD-10-CM | POA: Diagnosis not present

## 2016-06-21 NOTE — Assessment & Plan Note (Addendum)
Secondary to MS and spastic paraplegia. She has decided to decline any future catheter changes , regarding the prevention of infection as futile care and not in keeping with her desire for a natura l swift death.

## 2016-06-21 NOTE — Patient Instructions (Signed)
I have renewed your MOST form and agree that changing your catheter would prolong your life, so I agree and will honor your decision to abstain from any more catheter changes

## 2016-06-21 NOTE — Progress Notes (Signed)
Pre-visit discussion using our clinic review tool. No additional management support is needed unless otherwise documented below in the visit note.  

## 2016-06-21 NOTE — Progress Notes (Signed)
Subjective:  Patient ID: Angelica Wilcox, female    DOB: 1945/07/02  Age: 71 y.o. MRN: [id number]  CC: Diagnoses of Protein-calorie malnutrition, mild ([place]), Suprapubic catheter ([place]), Encounter for end of life care, and Cognitive complaints with normal neuropsychological exam were pertinent to this visit.  HPI Angelica Wilcox presents for FOLLOW UP ON advanced multiple sclerosis and end of life issues.  Patient was released from  Hospice  Recently.    Palliative care consult was placed in late December for pain management  And she has had 2 visits from palliative care social worker and nurse . Patient's primary concern is that her end of life wishes were not followed by hospice when they continually requested treatment of her UTI's.  She has requested NO ANTIBIOTICS  Or other life sustaining measures (per MOST Form, signed January 2017) and is very concerned about  continuining to live through progression of her cognitive decline.  She is not depressed ,  And her daughter is present and desires to hoinor her mother's wishes.   She has decided to forgo her monthly catheter  change in hopes of getting a UTI to hasten her death.   Outpatient Medications Prior to Visit  Medication Sig Dispense Refill  . DULoxetine (CYMBALTA) 60 MG capsule TAKE 1 CAPSULE BY MOUTH AT BEDTIME 30 capsule 1  . furosemide (LASIX) 20 MG tablet TAKE 1 TABLET (20 MG TOTAL) BY MOUTH DAILY. 30 tablet 5  . gabapentin (NEURONTIN) 600 MG tablet TAKE 1 TABLET BY MOUTH TWICE A DAY (Patient taking differently: one tablet three times a day) 60 tablet 6  . Misc. Devices [facility]) MISC 1 each by Other route.    Marland Kitchen morphine (MS CONTIN) 30 MG 12 hr tablet 1 tablet every 8 hours 90 tablet 0  . Nystatin POWD Apply twice daily to irritated area 1 Bottle 0  . amoxicillin-clavulanate (AUGMENTIN) 875-125 MG tablet Take 1 tablet by mouth 2 (two) times daily. 14 tablet 0  . BACLOFEN IT 65.07 mcg model No. [id number] implant date 09/22/11  At [facility]    . Infant Care Products (DERMACLOUD) CREA Apply to affected areas 2-3 times a day. 430 g 2  . levothyroxine (SYNTHROID) 50 MCG tablet Take 0.5 tablets (25 mcg total) by mouth daily before breakfast. (Patient not taking: Reported on 05/07/2016) 30 tablet 2  . LORazepam (ATIVAN) 0.5 MG tablet Take 1 tablet (0.5 mg total) by mouth daily as needed. 30 tablet 3  . NON FORMULARY 500 mcg by Other route.    . nystatin cream (MYCOSTATIN) Reported on 08/13/2015  0   No facility-administered medications prior to visit.     Review of Systems;  Patient denies headache, fevers, malaise, unintentional weight loss, skin rash, eye pain, sinus congestion and sinus pain, sore throat, dysphagia,  hemoptysis , cough, dyspnea, wheezing, chest pain, palpitations, orthopnea, edema, abdominal pain, nausea, melena, diarrhea, constipation, flank pain, dysuria, hematuria, urinary  Frequency, nocturia, numbness, tingling, seizures,  Focal weakness, Loss of consciousness,  Tremor, insomnia, depression, anxiety, and suicidal ideation.      Objective:  BP 138/86   Pulse 85   Temp 97.8 F (36.6 C) (Oral)   Resp 16   SpO2 91%   BP Readings from Last 3 Encounters:  06/21/16 138/86  05/07/16 135/68  05/04/16 128/64    Wt Readings from Last 3 Encounters:  05/07/16 150 lb (68 kg)  05/04/16 150 lb (68 kg)  02/10/16 150 lb (68 kg)    General appearance:  alert, cooperative and appears stated age Ears: normal TM's and external ear canals both ears Throat: lips, mucosa, and tongue normal; teeth and gums normal Neck: no adenopathy, no carotid bruit, supple, symmetrical, trachea midline and thyroid not enlarged, symmetric, no tenderness/mass/nodules Back: symmetric, no curvature. ROM normal. No CVA tenderness. Lungs: clear to auscultation bilaterally Heart: regular rate and rhythm, S1, S2 normal, no murmur, click, rub or gallop Abdomen: soft, non-tender; bowel sounds normal; no masses,  no organomegaly Pulses:  2+ and symmetric Skin: Skin color, texture, turgor normal. No rashes or lesions Lymph nodes: Cervical, supraclavicular, and axillary nodes normal.  No results found for: HGBA1C  Lab Results  Component Value Date   CREATININE 0.50 02/20/2016   CREATININE 0.57 09/18/2014   CREATININE 0.5 09/19/2013    Lab Results  Component Value Date   WBC 8.9 09/19/2013   HGB 13.6 09/19/2013   HCT 40.4 09/19/2013   PLT 282.0 09/19/2013   GLUCOSE 88 02/20/2016   CHOL 285 (H) 07/28/2011   TRIG 206.0 (H) 07/28/2011   HDL 49.10 07/28/2011   LDLDIRECT 249.6 08/24/2012   ALT 55 (H) 02/20/2016   AST 50 (H) 02/20/2016   NA 139 02/20/2016   K 3.8 02/20/2016   CL 99 02/20/2016   CREATININE 0.50 02/20/2016   BUN 7 02/20/2016   CO2 29 02/20/2016   TSH 7.28 (H) 02/20/2016    No results found.  Assessment & Plan:   Problem List Items Addressed This Visit    Cognitive complaints with normal neuropsychological exam    She has developed short term memory deficits, confirmed by her family.  She is not interested in medication       Encounter for end of life care    She is adamant and determined to die before she loses her cognitive abilities , and has decided to stop having catheter changes so as not to prolong her life .  She is not depressed, and her family is supportive of her decision,       RESOLVED: Protein-calorie malnutrition, mild ([place])   Suprapubic catheter ([place])    Secondary to MS and spastic paraplegia. She has decided to decline any future catheter changes , regarding the prevention of infection as futile care and not in keeping with her desire for a natura l swift death.        A total of 25 minutes of face to face time was spent with patient more than half of which was spent in counselling about the above mentioned conditions  and coordination of care   I have discontinued [name]'s BACLOFEN IT, LORazepam, DERMACLOUD, NON FORMULARY, levothyroxine, and amoxicillin-clavulanate.  I am also having her maintain her Wheelchair, furosemide, gabapentin, DULoxetine, Nystatin, morphine, and nystatin cream.  Meds ordered this encounter  Medications  . nystatin cream (MYCOSTATIN)    Sig: Reported on 08/13/2015    Medications Discontinued During This Encounter  Medication Reason  . amoxicillin-clavulanate (AUGMENTIN) 875-125 MG tablet Completed Course  . BACLOFEN IT Error  . Infant Care Products (DERMACLOUD) CREA Error  . levothyroxine (SYNTHROID) 50 MCG tablet Error  . LORazepam (ATIVAN) 0.5 MG tablet Error  . NON FORMULARY Error  . nystatin cream (MYCOSTATIN) Error    Follow-up: No Follow-up on file.   Crecencio Mc, MD

## 2016-06-22 NOTE — Assessment & Plan Note (Addendum)
She is adamant and determined to die before she loses her cognitive abilities , and has decided to stop having catheter changes so as not to prolong her life .  She is not depressed, and her family is supportive of her decision,

## 2016-06-22 NOTE — Assessment & Plan Note (Signed)
She has developed short term memory deficits, confirmed by her family.  She is not interested in medication

## 2016-06-30 ENCOUNTER — Telehealth: Payer: Self-pay | Admitting: Internal Medicine

## 2016-06-30 NOTE — Telephone Encounter (Signed)
Pt's caretaker Levada Dy called and is requesting a refill on pt's morphine (MS CONTIN) 30 MG 12 hr tablet. Please advise, thank you!  [id number] [place] [place]  Call pt @ [phone]

## 2016-07-01 MED ORDER — MORPHINE SULFATE ER 30 MG PO TBCR: EXTENDED_RELEASE_TABLET | ORAL | 0 refills | Status: DC

## 2016-07-01 NOTE — Telephone Encounter (Signed)
Refill for feb, march and April printed.  Needs follow visit in late April

## 2016-07-01 NOTE — Telephone Encounter (Signed)
Last filled 06/02/16 # 86. Last OV with you was 06/21/16.  No existing appts on file. Please advise.

## 2016-07-01 NOTE — Telephone Encounter (Signed)
I called pt- Rxs upfront for p/u. No answer. Left detailed mess informing pt to schedule OV for April and rxs are ready for p/u.

## 2016-07-01 NOTE — Telephone Encounter (Signed)
Pt care taker has requested a update on this request.

## 2016-07-19 ENCOUNTER — Telehealth: Payer: Self-pay | Admitting: Internal Medicine

## 2016-07-19 DIAGNOSIS — J209 Acute bronchitis, unspecified: Secondary | ICD-10-CM

## 2016-07-19 DIAGNOSIS — J440 Chronic obstructive pulmonary disease with acute lower respiratory infection: Principal | ICD-10-CM

## 2016-07-19 NOTE — Telephone Encounter (Signed)
LOV: 06/21/16 No existing Please advise on below

## 2016-07-19 NOTE — Telephone Encounter (Signed)
DME order cannot be printed without more info  For tubing and mask (generic O for nebulizer).  Does patient need [place] to send it to a hoem health agency , etc?

## 2016-07-19 NOTE — Telephone Encounter (Signed)
Pt's caretaker Levada Dy called and is requesting for some tubing and mask for nebulizer. Pt is having some chest congestion, they are not treating anything with antibiotics. Please advise, thank you!  Call @ [phone]

## 2016-07-20 MED ORDER — UNABLE TO FIND: 0 refills | Status: DC

## 2016-07-20 MED ORDER — UNABLE TO FIND: 0 refills | Status: AC

## 2016-07-20 NOTE — Telephone Encounter (Signed)
I spoke to Angelica Wilcox. She states we need to fax rx to [facility] for nebulizer, tubing and mask.  The pt has some congestion and does not want to use any antibiotics. They want the nebulizer to help clear her airway and make her breathing more comfortable.   The patient already has ipratropium-albuterol 0.5-3mg    Dr. Derrel Nip, I pended rx for you to sign, print and I will fax to [place].

## 2016-07-20 NOTE — Telephone Encounter (Signed)
Pt caregiver called about [facility] has not received the Rx. Please advise?  Please call caregiver at [phone]. Thank you!

## 2016-07-20 NOTE — Telephone Encounter (Signed)
Rx printed/pending MD sig. Will fax to [place].

## 2016-07-20 NOTE — Telephone Encounter (Signed)
Rx faxed to number below. Angela informed.

## 2016-07-20 NOTE — Telephone Encounter (Signed)
Signed order,  Thank you!!

## 2016-07-23 ENCOUNTER — Other Ambulatory Visit: Payer: Self-pay | Admitting: Internal Medicine

## 2016-07-27 ENCOUNTER — Telehealth: Payer: Self-pay | Admitting: *Deleted

## 2016-07-27 MED ORDER — MORPHINE SULFATE (CONCENTRATE) 10 MG /0.5 ML PO SOLN: ORAL | 0 refills | Status: DC

## 2016-07-27 NOTE — Telephone Encounter (Signed)
LOV: 06/21/16  No exisiting Please advise on liquid morphine refill and low O2 sat.

## 2016-07-27 NOTE — Telephone Encounter (Signed)
Levada Dy informed of below. Morphine Rx upfront for p/u.

## 2016-07-27 NOTE — Telephone Encounter (Signed)
Requested medication refill for : liquid  Morphine  [facility] [address]  Patient Contact: [phone] Pt had a low o2 reading this morning of 89.(taken by home health)  Pt's caregiver requested a call to discuss the proper steps tot take in case pt decides to have oxygen in the home.

## 2016-07-27 NOTE — Telephone Encounter (Signed)
Patient has to have a sat < 88% on room air that improves to > 88% with supplemental oxygen, this may require an office visit for medicare to pay for it   mrohpine refilled

## 2016-07-28 ENCOUNTER — Other Ambulatory Visit: Payer: Self-pay | Admitting: Internal Medicine

## 2016-07-29 ENCOUNTER — Encounter: Payer: Self-pay | Admitting: Neurology

## 2016-07-29 ENCOUNTER — Ambulatory Visit (INDEPENDENT_AMBULATORY_CARE_PROVIDER_SITE_OTHER): Payer: Medicare Other | Admitting: Neurology

## 2016-07-29 VITALS — BP 133/78 | HR 86

## 2016-07-29 DIAGNOSIS — G35 Multiple sclerosis: Secondary | ICD-10-CM | POA: Diagnosis not present

## 2016-07-29 DIAGNOSIS — G8250 Quadriplegia, unspecified: Secondary | ICD-10-CM

## 2016-07-29 MED ORDER — BACLOFEN 10 MG/20ML IT SOLN
10.0000 mg | Freq: Once | INTRATHECAL | Status: AC
  Administered 2016-07-29: 10 mg via INTRATHECAL

## 2016-07-29 NOTE — Procedures (Signed)
      History:  Angelica Wilcox is a 71 year old patient with a history of multiple sclerosis and a spastic quadriparesis. The patient comes in for a baclofen pump refill. She denies any new issues with her spasticity, her current pump settings are adequate for her.  Baclofen pump refill note  The baclofen pump site was cleaned with Betadine solution. A 21-gauge needle was inserted into the pump port site. Approximately 2.5 cc of residual baclofen was removed, the pump indicates a 1.8 mL residual. 20 cc of replacement baclofen was placed into the pump at 500 mcg/cc concentration.  The pump was reprogrammed for the following settings: Simple continuous rate was kept at 109.97 g per day.  The alarm volume is set at 1.0 cc. The next alarm date is 10/23/2016.  The patient tolerated the procedure well. There were no complications of the above procedure.  The [place] number is [phone] The baclofen expiration date is July 2019. The baclofen lot number is [id number].

## 2016-07-29 NOTE — Progress Notes (Signed)
Please refer to baclofen pump procedure note.  The patient indicates that she has an advanced directive, she will not want any treatment with antibiotics, she does not want her indwelling catheter changed. The patient indicates that she is ready to pass away.

## 2016-08-06 ENCOUNTER — Other Ambulatory Visit: Payer: Self-pay | Admitting: Internal Medicine

## 2016-08-06 MED ORDER — GABAPENTIN 600 MG PO TABS: 600.0000 mg | ORAL_TABLET | Freq: Three times a day (TID) | ORAL | 1 refills | Status: DC

## 2016-08-06 NOTE — Telephone Encounter (Signed)
Pt's caregiver called and stated that pt only has 2 pills left. Can we please send over the pharmacy today? Thank you!  Call pt @ [phone]  [id number] [place] [place]

## 2016-08-06 NOTE — Telephone Encounter (Signed)
Refilled on 03/24/2015 Last Ov: 06/21/2016 Next Ov: 09/15/2016  Please advise.

## 2016-08-06 NOTE — Telephone Encounter (Signed)
REFILLED

## 2016-08-06 NOTE — Addendum Note (Signed)
Addended by: Crecencio Mc on: 08/06/2016 12:54 PM   Modules accepted: Orders

## 2016-08-09 ENCOUNTER — Other Ambulatory Visit: Payer: Self-pay | Admitting: Internal Medicine

## 2016-08-20 ENCOUNTER — Telehealth: Payer: Self-pay | Admitting: Internal Medicine

## 2016-08-20 MED ORDER — OXYBUTYNIN CHLORIDE 5 MG PO TABS: 5.0000 mg | ORAL_TABLET | Freq: Three times a day (TID) | ORAL | 3 refills | Status: DC

## 2016-08-20 NOTE — Telephone Encounter (Signed)
Spoke with pt's caregiver Levada Dy and informed her that Dr. Derrel Nip has sent in the rx for oxybutin.

## 2016-08-20 NOTE — Telephone Encounter (Signed)
Do not see oxybutin in pt's medication list. Please advise.

## 2016-08-20 NOTE — Telephone Encounter (Signed)
No problem.  refilled

## 2016-08-20 NOTE — Telephone Encounter (Signed)
Angela pt's care giver called is requesting a rx for oxybutin for bladder spasms. Please advise, thank you!  Call [place] @ [phone]  [id number] [name], [place]

## 2016-08-23 DIAGNOSIS — G8929 Other chronic pain: Secondary | ICD-10-CM | POA: Diagnosis not present

## 2016-08-23 DIAGNOSIS — G35 Multiple sclerosis: Secondary | ICD-10-CM | POA: Diagnosis not present

## 2016-08-23 DIAGNOSIS — A419 Sepsis, unspecified organism: Secondary | ICD-10-CM | POA: Diagnosis not present

## 2016-08-23 DIAGNOSIS — K5730 Diverticulosis of large intestine without perforation or abscess without bleeding: Secondary | ICD-10-CM | POA: Diagnosis not present

## 2016-08-23 DIAGNOSIS — F341 Dysthymic disorder: Secondary | ICD-10-CM | POA: Diagnosis not present

## 2016-08-23 DIAGNOSIS — I10 Essential (primary) hypertension: Secondary | ICD-10-CM | POA: Diagnosis not present

## 2016-08-23 DIAGNOSIS — E039 Hypothyroidism, unspecified: Secondary | ICD-10-CM | POA: Diagnosis not present

## 2016-08-23 DIAGNOSIS — Z435 Encounter for attention to cystostomy: Secondary | ICD-10-CM | POA: Diagnosis not present

## 2016-08-24 DIAGNOSIS — E039 Hypothyroidism, unspecified: Secondary | ICD-10-CM | POA: Diagnosis not present

## 2016-08-24 DIAGNOSIS — G8929 Other chronic pain: Secondary | ICD-10-CM | POA: Diagnosis not present

## 2016-08-24 DIAGNOSIS — G35 Multiple sclerosis: Secondary | ICD-10-CM | POA: Diagnosis not present

## 2016-08-24 DIAGNOSIS — A419 Sepsis, unspecified organism: Secondary | ICD-10-CM | POA: Diagnosis not present

## 2016-08-24 DIAGNOSIS — Z435 Encounter for attention to cystostomy: Secondary | ICD-10-CM | POA: Diagnosis not present

## 2016-08-24 DIAGNOSIS — I10 Essential (primary) hypertension: Secondary | ICD-10-CM | POA: Diagnosis not present

## 2016-08-25 DIAGNOSIS — G8929 Other chronic pain: Secondary | ICD-10-CM | POA: Diagnosis not present

## 2016-08-25 DIAGNOSIS — A419 Sepsis, unspecified organism: Secondary | ICD-10-CM | POA: Diagnosis not present

## 2016-08-25 DIAGNOSIS — I10 Essential (primary) hypertension: Secondary | ICD-10-CM | POA: Diagnosis not present

## 2016-08-25 DIAGNOSIS — E039 Hypothyroidism, unspecified: Secondary | ICD-10-CM | POA: Diagnosis not present

## 2016-08-25 DIAGNOSIS — G35 Multiple sclerosis: Secondary | ICD-10-CM | POA: Diagnosis not present

## 2016-08-25 DIAGNOSIS — Z435 Encounter for attention to cystostomy: Secondary | ICD-10-CM | POA: Diagnosis not present

## 2016-08-26 DIAGNOSIS — E039 Hypothyroidism, unspecified: Secondary | ICD-10-CM | POA: Diagnosis not present

## 2016-08-26 DIAGNOSIS — G35 Multiple sclerosis: Secondary | ICD-10-CM | POA: Diagnosis not present

## 2016-08-26 DIAGNOSIS — Z435 Encounter for attention to cystostomy: Secondary | ICD-10-CM | POA: Diagnosis not present

## 2016-08-26 DIAGNOSIS — G8929 Other chronic pain: Secondary | ICD-10-CM | POA: Diagnosis not present

## 2016-08-26 DIAGNOSIS — I10 Essential (primary) hypertension: Secondary | ICD-10-CM | POA: Diagnosis not present

## 2016-08-26 DIAGNOSIS — A419 Sepsis, unspecified organism: Secondary | ICD-10-CM | POA: Diagnosis not present

## 2016-08-28 DIAGNOSIS — G35 Multiple sclerosis: Secondary | ICD-10-CM | POA: Diagnosis not present

## 2016-08-28 DIAGNOSIS — A419 Sepsis, unspecified organism: Secondary | ICD-10-CM | POA: Diagnosis not present

## 2016-08-28 DIAGNOSIS — I10 Essential (primary) hypertension: Secondary | ICD-10-CM | POA: Diagnosis not present

## 2016-08-28 DIAGNOSIS — E039 Hypothyroidism, unspecified: Secondary | ICD-10-CM | POA: Diagnosis not present

## 2016-08-28 DIAGNOSIS — G8929 Other chronic pain: Secondary | ICD-10-CM | POA: Diagnosis not present

## 2016-08-28 DIAGNOSIS — Z435 Encounter for attention to cystostomy: Secondary | ICD-10-CM | POA: Diagnosis not present

## 2016-08-29 DIAGNOSIS — Z435 Encounter for attention to cystostomy: Secondary | ICD-10-CM | POA: Diagnosis not present

## 2016-08-29 DIAGNOSIS — K5730 Diverticulosis of large intestine without perforation or abscess without bleeding: Secondary | ICD-10-CM | POA: Diagnosis not present

## 2016-08-29 DIAGNOSIS — E039 Hypothyroidism, unspecified: Secondary | ICD-10-CM | POA: Diagnosis not present

## 2016-08-29 DIAGNOSIS — I10 Essential (primary) hypertension: Secondary | ICD-10-CM | POA: Diagnosis not present

## 2016-08-29 DIAGNOSIS — G8929 Other chronic pain: Secondary | ICD-10-CM | POA: Diagnosis not present

## 2016-08-29 DIAGNOSIS — A419 Sepsis, unspecified organism: Secondary | ICD-10-CM | POA: Diagnosis not present

## 2016-08-29 DIAGNOSIS — G35 Multiple sclerosis: Secondary | ICD-10-CM | POA: Diagnosis not present

## 2016-08-29 DIAGNOSIS — F341 Dysthymic disorder: Secondary | ICD-10-CM | POA: Diagnosis not present

## 2016-08-30 DIAGNOSIS — Z435 Encounter for attention to cystostomy: Secondary | ICD-10-CM | POA: Diagnosis not present

## 2016-08-30 DIAGNOSIS — G35 Multiple sclerosis: Secondary | ICD-10-CM | POA: Diagnosis not present

## 2016-08-30 DIAGNOSIS — I10 Essential (primary) hypertension: Secondary | ICD-10-CM | POA: Diagnosis not present

## 2016-08-30 DIAGNOSIS — A419 Sepsis, unspecified organism: Secondary | ICD-10-CM | POA: Diagnosis not present

## 2016-08-30 DIAGNOSIS — G8929 Other chronic pain: Secondary | ICD-10-CM | POA: Diagnosis not present

## 2016-08-30 DIAGNOSIS — E039 Hypothyroidism, unspecified: Secondary | ICD-10-CM | POA: Diagnosis not present

## 2016-08-31 DIAGNOSIS — Z435 Encounter for attention to cystostomy: Secondary | ICD-10-CM | POA: Diagnosis not present

## 2016-08-31 DIAGNOSIS — G35 Multiple sclerosis: Secondary | ICD-10-CM | POA: Diagnosis not present

## 2016-08-31 DIAGNOSIS — I10 Essential (primary) hypertension: Secondary | ICD-10-CM | POA: Diagnosis not present

## 2016-08-31 DIAGNOSIS — A419 Sepsis, unspecified organism: Secondary | ICD-10-CM | POA: Diagnosis not present

## 2016-08-31 DIAGNOSIS — E039 Hypothyroidism, unspecified: Secondary | ICD-10-CM | POA: Diagnosis not present

## 2016-08-31 DIAGNOSIS — G8929 Other chronic pain: Secondary | ICD-10-CM | POA: Diagnosis not present

## 2016-09-01 DIAGNOSIS — I10 Essential (primary) hypertension: Secondary | ICD-10-CM | POA: Diagnosis not present

## 2016-09-01 DIAGNOSIS — E039 Hypothyroidism, unspecified: Secondary | ICD-10-CM | POA: Diagnosis not present

## 2016-09-01 DIAGNOSIS — G35 Multiple sclerosis: Secondary | ICD-10-CM | POA: Diagnosis not present

## 2016-09-01 DIAGNOSIS — A419 Sepsis, unspecified organism: Secondary | ICD-10-CM | POA: Diagnosis not present

## 2016-09-01 DIAGNOSIS — Z435 Encounter for attention to cystostomy: Secondary | ICD-10-CM | POA: Diagnosis not present

## 2016-09-01 DIAGNOSIS — G8929 Other chronic pain: Secondary | ICD-10-CM | POA: Diagnosis not present

## 2016-09-02 DIAGNOSIS — A419 Sepsis, unspecified organism: Secondary | ICD-10-CM | POA: Diagnosis not present

## 2016-09-02 DIAGNOSIS — Z435 Encounter for attention to cystostomy: Secondary | ICD-10-CM | POA: Diagnosis not present

## 2016-09-02 DIAGNOSIS — E039 Hypothyroidism, unspecified: Secondary | ICD-10-CM | POA: Diagnosis not present

## 2016-09-02 DIAGNOSIS — I10 Essential (primary) hypertension: Secondary | ICD-10-CM | POA: Diagnosis not present

## 2016-09-02 DIAGNOSIS — G8929 Other chronic pain: Secondary | ICD-10-CM | POA: Diagnosis not present

## 2016-09-02 DIAGNOSIS — G35 Multiple sclerosis: Secondary | ICD-10-CM | POA: Diagnosis not present

## 2016-09-05 DIAGNOSIS — G35 Multiple sclerosis: Secondary | ICD-10-CM | POA: Diagnosis not present

## 2016-09-05 DIAGNOSIS — E039 Hypothyroidism, unspecified: Secondary | ICD-10-CM | POA: Diagnosis not present

## 2016-09-05 DIAGNOSIS — G8929 Other chronic pain: Secondary | ICD-10-CM | POA: Diagnosis not present

## 2016-09-05 DIAGNOSIS — A419 Sepsis, unspecified organism: Secondary | ICD-10-CM | POA: Diagnosis not present

## 2016-09-05 DIAGNOSIS — Z435 Encounter for attention to cystostomy: Secondary | ICD-10-CM | POA: Diagnosis not present

## 2016-09-05 DIAGNOSIS — I10 Essential (primary) hypertension: Secondary | ICD-10-CM | POA: Diagnosis not present

## 2016-09-06 DIAGNOSIS — A419 Sepsis, unspecified organism: Secondary | ICD-10-CM | POA: Diagnosis not present

## 2016-09-06 DIAGNOSIS — E039 Hypothyroidism, unspecified: Secondary | ICD-10-CM | POA: Diagnosis not present

## 2016-09-06 DIAGNOSIS — G8929 Other chronic pain: Secondary | ICD-10-CM | POA: Diagnosis not present

## 2016-09-06 DIAGNOSIS — G35 Multiple sclerosis: Secondary | ICD-10-CM | POA: Diagnosis not present

## 2016-09-06 DIAGNOSIS — Z435 Encounter for attention to cystostomy: Secondary | ICD-10-CM | POA: Diagnosis not present

## 2016-09-06 DIAGNOSIS — I10 Essential (primary) hypertension: Secondary | ICD-10-CM | POA: Diagnosis not present

## 2016-09-07 DIAGNOSIS — I10 Essential (primary) hypertension: Secondary | ICD-10-CM | POA: Diagnosis not present

## 2016-09-07 DIAGNOSIS — A419 Sepsis, unspecified organism: Secondary | ICD-10-CM | POA: Diagnosis not present

## 2016-09-07 DIAGNOSIS — G35 Multiple sclerosis: Secondary | ICD-10-CM | POA: Diagnosis not present

## 2016-09-07 DIAGNOSIS — G8929 Other chronic pain: Secondary | ICD-10-CM | POA: Diagnosis not present

## 2016-09-07 DIAGNOSIS — E039 Hypothyroidism, unspecified: Secondary | ICD-10-CM | POA: Diagnosis not present

## 2016-09-07 DIAGNOSIS — Z435 Encounter for attention to cystostomy: Secondary | ICD-10-CM | POA: Diagnosis not present

## 2016-09-08 DIAGNOSIS — G8929 Other chronic pain: Secondary | ICD-10-CM | POA: Diagnosis not present

## 2016-09-08 DIAGNOSIS — A419 Sepsis, unspecified organism: Secondary | ICD-10-CM | POA: Diagnosis not present

## 2016-09-08 DIAGNOSIS — Z435 Encounter for attention to cystostomy: Secondary | ICD-10-CM | POA: Diagnosis not present

## 2016-09-08 DIAGNOSIS — E039 Hypothyroidism, unspecified: Secondary | ICD-10-CM | POA: Diagnosis not present

## 2016-09-08 DIAGNOSIS — G35 Multiple sclerosis: Secondary | ICD-10-CM | POA: Diagnosis not present

## 2016-09-08 DIAGNOSIS — I10 Essential (primary) hypertension: Secondary | ICD-10-CM | POA: Diagnosis not present

## 2016-09-11 DIAGNOSIS — G8929 Other chronic pain: Secondary | ICD-10-CM | POA: Diagnosis not present

## 2016-09-11 DIAGNOSIS — G35 Multiple sclerosis: Secondary | ICD-10-CM | POA: Diagnosis not present

## 2016-09-11 DIAGNOSIS — Z435 Encounter for attention to cystostomy: Secondary | ICD-10-CM | POA: Diagnosis not present

## 2016-09-11 DIAGNOSIS — E039 Hypothyroidism, unspecified: Secondary | ICD-10-CM | POA: Diagnosis not present

## 2016-09-11 DIAGNOSIS — A419 Sepsis, unspecified organism: Secondary | ICD-10-CM | POA: Diagnosis not present

## 2016-09-11 DIAGNOSIS — I10 Essential (primary) hypertension: Secondary | ICD-10-CM | POA: Diagnosis not present

## 2016-09-12 DIAGNOSIS — G35 Multiple sclerosis: Secondary | ICD-10-CM | POA: Diagnosis not present

## 2016-09-12 DIAGNOSIS — E039 Hypothyroidism, unspecified: Secondary | ICD-10-CM | POA: Diagnosis not present

## 2016-09-12 DIAGNOSIS — A419 Sepsis, unspecified organism: Secondary | ICD-10-CM | POA: Diagnosis not present

## 2016-09-12 DIAGNOSIS — I10 Essential (primary) hypertension: Secondary | ICD-10-CM | POA: Diagnosis not present

## 2016-09-12 DIAGNOSIS — G8929 Other chronic pain: Secondary | ICD-10-CM | POA: Diagnosis not present

## 2016-09-12 DIAGNOSIS — Z435 Encounter for attention to cystostomy: Secondary | ICD-10-CM | POA: Diagnosis not present

## 2016-09-14 ENCOUNTER — Other Ambulatory Visit: Payer: Self-pay | Admitting: Internal Medicine

## 2016-09-14 DIAGNOSIS — E039 Hypothyroidism, unspecified: Secondary | ICD-10-CM | POA: Diagnosis not present

## 2016-09-14 DIAGNOSIS — G8929 Other chronic pain: Secondary | ICD-10-CM | POA: Diagnosis not present

## 2016-09-14 DIAGNOSIS — Z435 Encounter for attention to cystostomy: Secondary | ICD-10-CM | POA: Diagnosis not present

## 2016-09-14 DIAGNOSIS — A419 Sepsis, unspecified organism: Secondary | ICD-10-CM | POA: Diagnosis not present

## 2016-09-14 DIAGNOSIS — G35 Multiple sclerosis: Secondary | ICD-10-CM | POA: Diagnosis not present

## 2016-09-14 DIAGNOSIS — I10 Essential (primary) hypertension: Secondary | ICD-10-CM | POA: Diagnosis not present

## 2016-09-15 ENCOUNTER — Encounter: Payer: Self-pay | Admitting: Internal Medicine

## 2016-09-15 ENCOUNTER — Ambulatory Visit (INDEPENDENT_AMBULATORY_CARE_PROVIDER_SITE_OTHER): Admitting: Internal Medicine

## 2016-09-15 DIAGNOSIS — G35 Multiple sclerosis: Secondary | ICD-10-CM | POA: Diagnosis not present

## 2016-09-15 DIAGNOSIS — G8929 Other chronic pain: Secondary | ICD-10-CM | POA: Diagnosis not present

## 2016-09-15 DIAGNOSIS — R419 Unspecified symptoms and signs involving cognitive functions and awareness: Secondary | ICD-10-CM | POA: Diagnosis not present

## 2016-09-15 DIAGNOSIS — I10 Essential (primary) hypertension: Secondary | ICD-10-CM | POA: Diagnosis not present

## 2016-09-15 DIAGNOSIS — Z435 Encounter for attention to cystostomy: Secondary | ICD-10-CM | POA: Diagnosis not present

## 2016-09-15 DIAGNOSIS — A419 Sepsis, unspecified organism: Secondary | ICD-10-CM | POA: Diagnosis not present

## 2016-09-15 DIAGNOSIS — E039 Hypothyroidism, unspecified: Secondary | ICD-10-CM | POA: Diagnosis not present

## 2016-09-15 MED ORDER — MORPHINE SULFATE (CONCENTRATE) 10 MG /0.5 ML PO SOLN: ORAL | 0 refills | Status: DC

## 2016-09-15 MED ORDER — MORPHINE SULFATE ER 30 MG PO TBCR: EXTENDED_RELEASE_TABLET | ORAL | 0 refills | Status: DC

## 2016-09-15 MED ORDER — GABAPENTIN 600 MG PO TABS: 600.0000 mg | ORAL_TABLET | Freq: Three times a day (TID) | ORAL | 5 refills | Status: DC

## 2016-09-15 NOTE — Progress Notes (Signed)
Pre visit review using our clinic review tool, if applicable. No additional management support is needed unless otherwise documented below in the visit note. 

## 2016-09-15 NOTE — Progress Notes (Signed)
ive Subjective:  Patient ID: Angelica Wilcox, female    DOB: [birth year]  Age: 71 y.o. MRN: [id number]  CC: Diagnoses of Cognitive complaints with normal neuropsychological exam and Other chronic pain were pertinent to this visit.  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  difficult week about 3 weeks ago,  Developed Severe abd cramping  Due to constipation that was finally resolved after passing 4 large BMs   (patient refuses to use laxatives due to fear of diarrhea) .  Her  foley catheter balloon had burst and she was unable to void until it was replaced. Recovered completely,  With good appetite,  Etc   "I''m feeling better than I want to be"  Becoming more forgetful,  Conversations meanderand trail off . Again worried about living beyond her ability to maintain cognitive independence.   Worried about hearing that gabapentin no longer being on the market.  Tried lyrica in the remote past, did not tolerate  it.   She continues to worry about surviving beyond the point where she is competent to make decisions  Outpatient Medications Prior to Visit  Medication Sig Dispense Refill  . DULoxetine (CYMBALTA) 60 MG capsule TAKE 1 CAPSULE BY MOUTH AT BEDTIME 15 capsule 2  . furosemide (LASIX) 20 MG tablet TAKE ONE TABLET BY MOUTH EVERY DAY 15 tablet 2  . Misc. Devices [facility]) MISC 1 each by Other route.    . nystatin cream (MYCOSTATIN) Reported on 08/13/2015    . Nystatin POWD Apply twice daily to irritated area 1 Bottle 0  . UNABLE TO FIND Please dispense # 1 nebulizer with air compressor, corr tubing- disposable and a aerosol mask for patient's nebulizer.  Dx: G35 1 each 0  . gabapentin (NEURONTIN) 600 MG tablet Take 1 tablet (600 mg total) by mouth 3 (three) times daily. 90 tablet 1  . morphine (MS CONTIN) 30 MG 12 hr tablet 1 tablet  every 8 hours 90 tablet 0  . Morphine Sulfate (MORPHINE CONCENTRATE) 10 mg / 0.5 ml concentrated solution TAKE 0.5 ML  BY MOUTH EVERY 4 HOURS AS NEEDED FOR BREAKTHROUGH PAIN 15 mL 0  . oxybutynin (DITROPAN) 5 MG tablet Take 1 tablet (5 mg total) by mouth 3 (three) times daily. (Patient not taking: Reported on 09/15/2016) 90 tablet 3   No facility-administered medications prior to visit.     Review of Systems;  Patient denies headache, fevers, malaise, unintentional weight loss, skin rash, eye pain, sinus congestion and sinus pain, sore throat, dysphagia,  hemoptysis , cough, dyspnea, wheezing, chest pain, palpitations, orthopnea, edema, abdominal pain, nausea, melena, diarrhea, constipation, flank pain, dysuria, hematuria, urinary  Frequency, nocturia, numbness, tingling, seizures,  Focal weakness, Loss of consciousness,  Tremor, insomnia, depression, anxiety, and suicidal ideation.      Objective:  BP 140/88   Pulse 82   Temp 97.8 F (36.6 C) (Oral)   Resp 15   SpO2 94%   BP Readings from Last 3 Encounters:  09/15/16 140/88  07/29/16 133/78  06/21/16 138/86    Wt Readings from Last 3 Encounters:  05/07/16 150 lb (68 kg)  05/04/16 150 lb (68 kg)  02/10/16 150 lb (68 kg)    General appearance: alert, cooperative and appears stated age Ears: normal TM's and external ear canals both ears Throat: lips, mucosa, and tongue normal; teeth and  gums normal Neck: no adenopathy, no carotid bruit, supple, symmetrical, trachea midline and thyroid not enlarged, symmetric, no tenderness/mass/nodules Back: symmetric, no curvature. ROM normal. No CVA tenderness. Lungs: clear to auscultation bilaterally Heart: regular rate and rhythm, S1, S2 normal, no murmur, click, rub or gallop Abdomen: soft, non-tender; bowel sounds normal; no masses,  no organomegaly Pulses: 2+ and symmetric Skin: Skin color, texture, turgor normal. No rashes or lesions Lymph nodes: Cervical, supraclavicular, and axillary  nodes normal.  No results found for: HGBA1C  Lab Results  Component Value Date   CREATININE 0.50 02/20/2016   CREATININE 0.57 09/18/2014   CREATININE 0.5 09/19/2013    Lab Results  Component Value Date   WBC 8.9 09/19/2013   HGB 13.6 09/19/2013   HCT 40.4 09/19/2013   PLT 282.0 09/19/2013   GLUCOSE 88 02/20/2016   CHOL 285 (H) 07/28/2011   TRIG 206.0 (H) 07/28/2011   HDL 49.10 07/28/2011   LDLDIRECT 249.6 08/24/2012   ALT 55 (H) 02/20/2016   AST 50 (H) 02/20/2016   NA 139 02/20/2016   K 3.8 02/20/2016   CL 99 02/20/2016   CREATININE 0.50 02/20/2016   BUN 7 02/20/2016   CO2 29 02/20/2016   TSH 7.28 (H) 02/20/2016    No results found.  Assessment & Plan:   Problem List Items Addressed This Visit    Chronic pain    Managed with increased gabapentin dose of 300 mg tid , cymbalta, and   morphine per Hospice.  Refills given today on MS Contin and liquid Morphine       Relevant Medications   gabapentin (NEURONTIN) 600 MG tablet   Cognitive complaints with normal neuropsychological exam    She continues to be competent about making decisions but is noting that she is having more trouble being articulate and focused.          I have discontinued Ms. Macapagal's morphine, morphine CONCENTRATE, oxybutynin, Hyoscyamine Sulfate (HYOSCYAMINE PO), morphine, morphine CONCENTRATE, and morphine. I am also having her maintain her Wheelchair, Nystatin, nystatin cream, UNABLE TO FIND, furosemide, DULoxetine, hyoscyamine, LORazepam, and gabapentin.  Meds ordered this encounter  Medications  . DISCONTD: Hyoscyamine Sulfate (HYOSCYAMINE PO)    Sig: Take by mouth.  . hyoscyamine (LEVSIN, ANASPAZ) 0.125 MG tablet    Sig: TAKE 1 TABLET BY MOUTH THREE TIMES A DAY AS NEEDED FOR ABDOMINAL PAIN    Refill:  1  . LORazepam (ATIVAN) 0.5 MG tablet  . DISCONTD: morphine (MS CONTIN) 30 MG 12 hr tablet    Sig: 1 tablet every 8 hours    Dispense:  90 tablet    Refill:  0    May refill on or  after Sep 29 2016  . DISCONTD: Morphine Sulfate (MORPHINE CONCENTRATE) 10 mg / 0.5 ml concentrated solution    Sig: TAKE 0.5 ML  BY MOUTH EVERY 4 HOURS AS NEEDED FOR BREAKTHROUGH PAIN    Dispense:  15 mL    Refill:  0    Hospice patient  . gabapentin (NEURONTIN) 600 MG tablet    Sig: Take 1 tablet (600 mg total) by mouth 3 (three) times daily.    Dispense:  90 tablet    Refill:  5    HOSPICE PATIENT,  KEEP ON FILE FOR FUTURE REFILLS  . DISCONTD: morphine (MS CONTIN) 30 MG 12 hr tablet    Sig: 1 tablet every 8 hours    Dispense:  90 tablet    Refill:  0    May  refill on or after October 30 2016  . DISCONTD: Morphine Sulfate (MORPHINE CONCENTRATE) 10 mg / 0.5 ml concentrated solution    Sig: TAKE 0.5 ML  BY MOUTH EVERY 4 HOURS AS NEEDED FOR BREAKTHROUGH PAIN    Dispense:  15 mL    Refill:  0    Hospice patient  . DISCONTD: morphine (MS CONTIN) 30 MG 12 hr tablet    Sig: 1 tablet every 8 hours    Dispense:  90 tablet    Refill:  0    May refill on or after November 29 2016    A total of 25 minutes of face to face time was spent with patient more than half of which was spent in counselling about the above mentioned conditions  and coordination of care .   Medications Discontinued During This Encounter  Medication Reason  . oxybutynin (DITROPAN) 5 MG tablet Patient has not taken in last 30 days  . Hyoscyamine Sulfate (HYOSCYAMINE PO) Duplicate  . morphine (MS CONTIN) 30 MG 12 hr tablet Reorder  . Morphine Sulfate (MORPHINE CONCENTRATE) 10 mg / 0.5 ml concentrated solution Reorder  . gabapentin (NEURONTIN) 600 MG tablet Reorder  . morphine (MS CONTIN) 30 MG 12 hr tablet Reorder  . Morphine Sulfate (MORPHINE CONCENTRATE) 10 mg / 0.5 ml concentrated solution Reorder  . morphine (MS CONTIN) 30 MG 12 hr tablet Reorder    Follow-up: Return in about 3 months (around 12/15/2016) for on or before August 2 .   Crecencio Mc, MD

## 2016-09-15 NOTE — Patient Instructions (Signed)
I will send a message to Dr Ermalinda Memos that you would like to talk with her

## 2016-09-16 ENCOUNTER — Telehealth: Payer: Self-pay | Admitting: Internal Medicine

## 2016-09-16 DIAGNOSIS — G8929 Other chronic pain: Secondary | ICD-10-CM | POA: Diagnosis not present

## 2016-09-16 DIAGNOSIS — I10 Essential (primary) hypertension: Secondary | ICD-10-CM | POA: Diagnosis not present

## 2016-09-16 DIAGNOSIS — E039 Hypothyroidism, unspecified: Secondary | ICD-10-CM | POA: Diagnosis not present

## 2016-09-16 DIAGNOSIS — G35 Multiple sclerosis: Secondary | ICD-10-CM | POA: Diagnosis not present

## 2016-09-16 DIAGNOSIS — A419 Sepsis, unspecified organism: Secondary | ICD-10-CM | POA: Diagnosis not present

## 2016-09-16 DIAGNOSIS — Z435 Encounter for attention to cystostomy: Secondary | ICD-10-CM | POA: Diagnosis not present

## 2016-09-16 MED ORDER — MORPHINE SULFATE ER 30 MG PO TBCR: EXTENDED_RELEASE_TABLET | ORAL | 0 refills | Status: DC

## 2016-09-16 NOTE — Telephone Encounter (Signed)
SHE WILL NEED TO RETURN THE RX'S FOR MS CONTIN THAT WERE  GIVEN TO HER YESTERDAY .  RX FOR TWO WEEKS GIVEN X 2

## 2016-09-16 NOTE — Telephone Encounter (Signed)
Patient was seen yesterday, called the caregiver and originally the Rx was for 90 pills as she was paying out of pocket, now patient is getting through hospice and they will only allow a 2 week supply.  So the  Last refill (not yesterdays) was only for a 2 week supply and she needs the additional to get her to the new prescription. Please advise, thanks

## 2016-09-16 NOTE — Telephone Encounter (Signed)
Left voice mail to call back 

## 2016-09-16 NOTE — Telephone Encounter (Signed)
Pt caregiver called about pt needing a refill for morphine (MS CONTIN) 30 MG 12 hr tablet.  Pharmacy is [id number] - [place], [place]  Call pt @ [phone]. Thank you!

## 2016-09-16 NOTE — Telephone Encounter (Signed)
[name] at [facility] advised that we were going to be faxing in new scripts for patient Angelica Wilcox.  Since patient had already dropped scripts off scripts  they are going to destroy scripts they have on file for Angelica Wilcox for quantity #90 written on 09/15/16   Pharmacy also wanted to make you aware that Morphine Sulfate  Solution quantity comes in 30 mL we having been writing for # 15 mL.

## 2016-09-16 NOTE — Telephone Encounter (Signed)
Spoke with Levada Dy caregiver and she states [facility]

## 2016-09-16 NOTE — Telephone Encounter (Signed)
Levada Dy pt caregiver called back returning your call.   Call Angela back @ [phone]. Thank you!

## 2016-09-16 NOTE — Telephone Encounter (Signed)
Please return a call to caretaker Levada Dy @ [phone]

## 2016-09-17 DIAGNOSIS — Z435 Encounter for attention to cystostomy: Secondary | ICD-10-CM | POA: Diagnosis not present

## 2016-09-17 DIAGNOSIS — I10 Essential (primary) hypertension: Secondary | ICD-10-CM | POA: Diagnosis not present

## 2016-09-17 DIAGNOSIS — G8929 Other chronic pain: Secondary | ICD-10-CM | POA: Diagnosis not present

## 2016-09-17 DIAGNOSIS — G35 Multiple sclerosis: Secondary | ICD-10-CM | POA: Diagnosis not present

## 2016-09-17 DIAGNOSIS — A419 Sepsis, unspecified organism: Secondary | ICD-10-CM | POA: Diagnosis not present

## 2016-09-17 DIAGNOSIS — E039 Hypothyroidism, unspecified: Secondary | ICD-10-CM | POA: Diagnosis not present

## 2016-09-17 MED ORDER — MORPHINE SULFATE (CONCENTRATE) 10 MG /0.5 ML PO SOLN: ORAL | 0 refills | Status: DC

## 2016-09-17 NOTE — Telephone Encounter (Signed)
please call Angelica Wilcox I do not have time.  I have updated the script for  liquid morphine for 30 ml ,  wwe wil send it when she has used up to one previously faxed for 15 ml

## 2016-09-17 NOTE — Telephone Encounter (Signed)
Angela caregiver notified script ready for pickup at pharmacy.

## 2016-09-18 DIAGNOSIS — G35 Multiple sclerosis: Secondary | ICD-10-CM | POA: Diagnosis not present

## 2016-09-18 DIAGNOSIS — Z435 Encounter for attention to cystostomy: Secondary | ICD-10-CM | POA: Diagnosis not present

## 2016-09-18 DIAGNOSIS — I10 Essential (primary) hypertension: Secondary | ICD-10-CM | POA: Diagnosis not present

## 2016-09-18 DIAGNOSIS — A419 Sepsis, unspecified organism: Secondary | ICD-10-CM | POA: Diagnosis not present

## 2016-09-18 DIAGNOSIS — G8929 Other chronic pain: Secondary | ICD-10-CM | POA: Diagnosis not present

## 2016-09-18 DIAGNOSIS — E039 Hypothyroidism, unspecified: Secondary | ICD-10-CM | POA: Diagnosis not present

## 2016-09-18 NOTE — Assessment & Plan Note (Signed)
Managed with increased gabapentin dose of 300 mg tid , cymbalta, and   morphine per Hospice.  Refills given today on MS Contin and liquid Morphine

## 2016-09-18 NOTE — Assessment & Plan Note (Signed)
She continues to be competent about making decisions but is noting that she is having more trouble being articulate and focused.

## 2016-09-19 DIAGNOSIS — Z435 Encounter for attention to cystostomy: Secondary | ICD-10-CM | POA: Diagnosis not present

## 2016-09-19 DIAGNOSIS — E039 Hypothyroidism, unspecified: Secondary | ICD-10-CM | POA: Diagnosis not present

## 2016-09-19 DIAGNOSIS — I10 Essential (primary) hypertension: Secondary | ICD-10-CM | POA: Diagnosis not present

## 2016-09-19 DIAGNOSIS — G8929 Other chronic pain: Secondary | ICD-10-CM | POA: Diagnosis not present

## 2016-09-19 DIAGNOSIS — G35 Multiple sclerosis: Secondary | ICD-10-CM | POA: Diagnosis not present

## 2016-09-19 DIAGNOSIS — A419 Sepsis, unspecified organism: Secondary | ICD-10-CM | POA: Diagnosis not present

## 2016-09-20 DIAGNOSIS — Z435 Encounter for attention to cystostomy: Secondary | ICD-10-CM | POA: Diagnosis not present

## 2016-09-20 DIAGNOSIS — G35 Multiple sclerosis: Secondary | ICD-10-CM | POA: Diagnosis not present

## 2016-09-20 DIAGNOSIS — G8929 Other chronic pain: Secondary | ICD-10-CM | POA: Diagnosis not present

## 2016-09-20 DIAGNOSIS — A419 Sepsis, unspecified organism: Secondary | ICD-10-CM | POA: Diagnosis not present

## 2016-09-20 DIAGNOSIS — I10 Essential (primary) hypertension: Secondary | ICD-10-CM | POA: Diagnosis not present

## 2016-09-20 DIAGNOSIS — E039 Hypothyroidism, unspecified: Secondary | ICD-10-CM | POA: Diagnosis not present

## 2016-09-21 DIAGNOSIS — Z435 Encounter for attention to cystostomy: Secondary | ICD-10-CM | POA: Diagnosis not present

## 2016-09-21 DIAGNOSIS — E039 Hypothyroidism, unspecified: Secondary | ICD-10-CM | POA: Diagnosis not present

## 2016-09-21 DIAGNOSIS — A419 Sepsis, unspecified organism: Secondary | ICD-10-CM | POA: Diagnosis not present

## 2016-09-21 DIAGNOSIS — I10 Essential (primary) hypertension: Secondary | ICD-10-CM | POA: Diagnosis not present

## 2016-09-21 DIAGNOSIS — G35 Multiple sclerosis: Secondary | ICD-10-CM | POA: Diagnosis not present

## 2016-09-21 DIAGNOSIS — G8929 Other chronic pain: Secondary | ICD-10-CM | POA: Diagnosis not present

## 2016-09-23 DIAGNOSIS — G35 Multiple sclerosis: Secondary | ICD-10-CM | POA: Diagnosis not present

## 2016-09-23 DIAGNOSIS — E039 Hypothyroidism, unspecified: Secondary | ICD-10-CM | POA: Diagnosis not present

## 2016-09-23 DIAGNOSIS — G8929 Other chronic pain: Secondary | ICD-10-CM | POA: Diagnosis not present

## 2016-09-23 DIAGNOSIS — Z435 Encounter for attention to cystostomy: Secondary | ICD-10-CM | POA: Diagnosis not present

## 2016-09-23 DIAGNOSIS — A419 Sepsis, unspecified organism: Secondary | ICD-10-CM | POA: Diagnosis not present

## 2016-09-23 DIAGNOSIS — I10 Essential (primary) hypertension: Secondary | ICD-10-CM | POA: Diagnosis not present

## 2016-09-24 DIAGNOSIS — E039 Hypothyroidism, unspecified: Secondary | ICD-10-CM | POA: Diagnosis not present

## 2016-09-24 DIAGNOSIS — I10 Essential (primary) hypertension: Secondary | ICD-10-CM | POA: Diagnosis not present

## 2016-09-24 DIAGNOSIS — A419 Sepsis, unspecified organism: Secondary | ICD-10-CM | POA: Diagnosis not present

## 2016-09-24 DIAGNOSIS — G35 Multiple sclerosis: Secondary | ICD-10-CM | POA: Diagnosis not present

## 2016-09-24 DIAGNOSIS — Z435 Encounter for attention to cystostomy: Secondary | ICD-10-CM | POA: Diagnosis not present

## 2016-09-24 DIAGNOSIS — G8929 Other chronic pain: Secondary | ICD-10-CM | POA: Diagnosis not present

## 2016-09-26 DIAGNOSIS — Z435 Encounter for attention to cystostomy: Secondary | ICD-10-CM | POA: Diagnosis not present

## 2016-09-26 DIAGNOSIS — A419 Sepsis, unspecified organism: Secondary | ICD-10-CM | POA: Diagnosis not present

## 2016-09-26 DIAGNOSIS — E039 Hypothyroidism, unspecified: Secondary | ICD-10-CM | POA: Diagnosis not present

## 2016-09-26 DIAGNOSIS — G35 Multiple sclerosis: Secondary | ICD-10-CM | POA: Diagnosis not present

## 2016-09-26 DIAGNOSIS — I10 Essential (primary) hypertension: Secondary | ICD-10-CM | POA: Diagnosis not present

## 2016-09-26 DIAGNOSIS — G8929 Other chronic pain: Secondary | ICD-10-CM | POA: Diagnosis not present

## 2016-09-27 DIAGNOSIS — E039 Hypothyroidism, unspecified: Secondary | ICD-10-CM | POA: Diagnosis not present

## 2016-09-27 DIAGNOSIS — A419 Sepsis, unspecified organism: Secondary | ICD-10-CM | POA: Diagnosis not present

## 2016-09-27 DIAGNOSIS — G8929 Other chronic pain: Secondary | ICD-10-CM | POA: Diagnosis not present

## 2016-09-27 DIAGNOSIS — I10 Essential (primary) hypertension: Secondary | ICD-10-CM | POA: Diagnosis not present

## 2016-09-27 DIAGNOSIS — Z435 Encounter for attention to cystostomy: Secondary | ICD-10-CM | POA: Diagnosis not present

## 2016-09-27 DIAGNOSIS — G35 Multiple sclerosis: Secondary | ICD-10-CM | POA: Diagnosis not present

## 2016-09-28 DIAGNOSIS — F341 Dysthymic disorder: Secondary | ICD-10-CM | POA: Diagnosis not present

## 2016-09-28 DIAGNOSIS — E039 Hypothyroidism, unspecified: Secondary | ICD-10-CM | POA: Diagnosis not present

## 2016-09-28 DIAGNOSIS — A419 Sepsis, unspecified organism: Secondary | ICD-10-CM | POA: Diagnosis not present

## 2016-09-28 DIAGNOSIS — Z435 Encounter for attention to cystostomy: Secondary | ICD-10-CM | POA: Diagnosis not present

## 2016-09-28 DIAGNOSIS — I10 Essential (primary) hypertension: Secondary | ICD-10-CM | POA: Diagnosis not present

## 2016-09-28 DIAGNOSIS — K5730 Diverticulosis of large intestine without perforation or abscess without bleeding: Secondary | ICD-10-CM | POA: Diagnosis not present

## 2016-09-28 DIAGNOSIS — G8929 Other chronic pain: Secondary | ICD-10-CM | POA: Diagnosis not present

## 2016-09-28 DIAGNOSIS — G35 Multiple sclerosis: Secondary | ICD-10-CM | POA: Diagnosis not present

## 2016-09-30 DIAGNOSIS — E039 Hypothyroidism, unspecified: Secondary | ICD-10-CM | POA: Diagnosis not present

## 2016-09-30 DIAGNOSIS — I10 Essential (primary) hypertension: Secondary | ICD-10-CM | POA: Diagnosis not present

## 2016-09-30 DIAGNOSIS — G35 Multiple sclerosis: Secondary | ICD-10-CM | POA: Diagnosis not present

## 2016-09-30 DIAGNOSIS — Z435 Encounter for attention to cystostomy: Secondary | ICD-10-CM | POA: Diagnosis not present

## 2016-09-30 DIAGNOSIS — G8929 Other chronic pain: Secondary | ICD-10-CM | POA: Diagnosis not present

## 2016-09-30 DIAGNOSIS — A419 Sepsis, unspecified organism: Secondary | ICD-10-CM | POA: Diagnosis not present

## 2016-10-01 DIAGNOSIS — Z435 Encounter for attention to cystostomy: Secondary | ICD-10-CM | POA: Diagnosis not present

## 2016-10-01 DIAGNOSIS — A419 Sepsis, unspecified organism: Secondary | ICD-10-CM | POA: Diagnosis not present

## 2016-10-01 DIAGNOSIS — E039 Hypothyroidism, unspecified: Secondary | ICD-10-CM | POA: Diagnosis not present

## 2016-10-01 DIAGNOSIS — I10 Essential (primary) hypertension: Secondary | ICD-10-CM | POA: Diagnosis not present

## 2016-10-01 DIAGNOSIS — G35 Multiple sclerosis: Secondary | ICD-10-CM | POA: Diagnosis not present

## 2016-10-01 DIAGNOSIS — G8929 Other chronic pain: Secondary | ICD-10-CM | POA: Diagnosis not present

## 2016-10-02 DIAGNOSIS — G8929 Other chronic pain: Secondary | ICD-10-CM | POA: Diagnosis not present

## 2016-10-02 DIAGNOSIS — Z435 Encounter for attention to cystostomy: Secondary | ICD-10-CM | POA: Diagnosis not present

## 2016-10-02 DIAGNOSIS — G35 Multiple sclerosis: Secondary | ICD-10-CM | POA: Diagnosis not present

## 2016-10-02 DIAGNOSIS — I10 Essential (primary) hypertension: Secondary | ICD-10-CM | POA: Diagnosis not present

## 2016-10-02 DIAGNOSIS — E039 Hypothyroidism, unspecified: Secondary | ICD-10-CM | POA: Diagnosis not present

## 2016-10-02 DIAGNOSIS — A419 Sepsis, unspecified organism: Secondary | ICD-10-CM | POA: Diagnosis not present

## 2016-10-03 DIAGNOSIS — Z435 Encounter for attention to cystostomy: Secondary | ICD-10-CM | POA: Diagnosis not present

## 2016-10-03 DIAGNOSIS — I10 Essential (primary) hypertension: Secondary | ICD-10-CM | POA: Diagnosis not present

## 2016-10-03 DIAGNOSIS — G8929 Other chronic pain: Secondary | ICD-10-CM | POA: Diagnosis not present

## 2016-10-03 DIAGNOSIS — E039 Hypothyroidism, unspecified: Secondary | ICD-10-CM | POA: Diagnosis not present

## 2016-10-03 DIAGNOSIS — A419 Sepsis, unspecified organism: Secondary | ICD-10-CM | POA: Diagnosis not present

## 2016-10-03 DIAGNOSIS — G35 Multiple sclerosis: Secondary | ICD-10-CM | POA: Diagnosis not present

## 2016-10-05 DIAGNOSIS — G8929 Other chronic pain: Secondary | ICD-10-CM | POA: Diagnosis not present

## 2016-10-05 DIAGNOSIS — G35 Multiple sclerosis: Secondary | ICD-10-CM | POA: Diagnosis not present

## 2016-10-05 DIAGNOSIS — A419 Sepsis, unspecified organism: Secondary | ICD-10-CM | POA: Diagnosis not present

## 2016-10-05 DIAGNOSIS — I10 Essential (primary) hypertension: Secondary | ICD-10-CM | POA: Diagnosis not present

## 2016-10-05 DIAGNOSIS — Z435 Encounter for attention to cystostomy: Secondary | ICD-10-CM | POA: Diagnosis not present

## 2016-10-05 DIAGNOSIS — E039 Hypothyroidism, unspecified: Secondary | ICD-10-CM | POA: Diagnosis not present

## 2016-10-06 DIAGNOSIS — I10 Essential (primary) hypertension: Secondary | ICD-10-CM | POA: Diagnosis not present

## 2016-10-06 DIAGNOSIS — G35 Multiple sclerosis: Secondary | ICD-10-CM | POA: Diagnosis not present

## 2016-10-06 DIAGNOSIS — A419 Sepsis, unspecified organism: Secondary | ICD-10-CM | POA: Diagnosis not present

## 2016-10-06 DIAGNOSIS — G8929 Other chronic pain: Secondary | ICD-10-CM | POA: Diagnosis not present

## 2016-10-06 DIAGNOSIS — Z435 Encounter for attention to cystostomy: Secondary | ICD-10-CM | POA: Diagnosis not present

## 2016-10-06 DIAGNOSIS — E039 Hypothyroidism, unspecified: Secondary | ICD-10-CM | POA: Diagnosis not present

## 2016-10-07 DIAGNOSIS — G35 Multiple sclerosis: Secondary | ICD-10-CM | POA: Diagnosis not present

## 2016-10-07 DIAGNOSIS — A419 Sepsis, unspecified organism: Secondary | ICD-10-CM | POA: Diagnosis not present

## 2016-10-07 DIAGNOSIS — I10 Essential (primary) hypertension: Secondary | ICD-10-CM | POA: Diagnosis not present

## 2016-10-07 DIAGNOSIS — G8929 Other chronic pain: Secondary | ICD-10-CM | POA: Diagnosis not present

## 2016-10-07 DIAGNOSIS — Z435 Encounter for attention to cystostomy: Secondary | ICD-10-CM | POA: Diagnosis not present

## 2016-10-07 DIAGNOSIS — E039 Hypothyroidism, unspecified: Secondary | ICD-10-CM | POA: Diagnosis not present

## 2016-10-10 DIAGNOSIS — A419 Sepsis, unspecified organism: Secondary | ICD-10-CM | POA: Diagnosis not present

## 2016-10-10 DIAGNOSIS — G8929 Other chronic pain: Secondary | ICD-10-CM | POA: Diagnosis not present

## 2016-10-10 DIAGNOSIS — E039 Hypothyroidism, unspecified: Secondary | ICD-10-CM | POA: Diagnosis not present

## 2016-10-10 DIAGNOSIS — Z435 Encounter for attention to cystostomy: Secondary | ICD-10-CM | POA: Diagnosis not present

## 2016-10-10 DIAGNOSIS — G35 Multiple sclerosis: Secondary | ICD-10-CM | POA: Diagnosis not present

## 2016-10-10 DIAGNOSIS — I10 Essential (primary) hypertension: Secondary | ICD-10-CM | POA: Diagnosis not present

## 2016-10-11 DIAGNOSIS — G35 Multiple sclerosis: Secondary | ICD-10-CM | POA: Diagnosis not present

## 2016-10-11 DIAGNOSIS — G8929 Other chronic pain: Secondary | ICD-10-CM | POA: Diagnosis not present

## 2016-10-11 DIAGNOSIS — Z435 Encounter for attention to cystostomy: Secondary | ICD-10-CM | POA: Diagnosis not present

## 2016-10-11 DIAGNOSIS — E039 Hypothyroidism, unspecified: Secondary | ICD-10-CM | POA: Diagnosis not present

## 2016-10-11 DIAGNOSIS — A419 Sepsis, unspecified organism: Secondary | ICD-10-CM | POA: Diagnosis not present

## 2016-10-11 DIAGNOSIS — I10 Essential (primary) hypertension: Secondary | ICD-10-CM | POA: Diagnosis not present

## 2016-10-12 DIAGNOSIS — A419 Sepsis, unspecified organism: Secondary | ICD-10-CM | POA: Diagnosis not present

## 2016-10-12 DIAGNOSIS — G8929 Other chronic pain: Secondary | ICD-10-CM | POA: Diagnosis not present

## 2016-10-12 DIAGNOSIS — Z435 Encounter for attention to cystostomy: Secondary | ICD-10-CM | POA: Diagnosis not present

## 2016-10-12 DIAGNOSIS — I10 Essential (primary) hypertension: Secondary | ICD-10-CM | POA: Diagnosis not present

## 2016-10-12 DIAGNOSIS — G35 Multiple sclerosis: Secondary | ICD-10-CM | POA: Diagnosis not present

## 2016-10-12 DIAGNOSIS — E039 Hypothyroidism, unspecified: Secondary | ICD-10-CM | POA: Diagnosis not present

## 2016-10-13 ENCOUNTER — Telehealth: Payer: Self-pay | Admitting: Internal Medicine

## 2016-10-13 DIAGNOSIS — A419 Sepsis, unspecified organism: Secondary | ICD-10-CM | POA: Diagnosis not present

## 2016-10-13 DIAGNOSIS — I10 Essential (primary) hypertension: Secondary | ICD-10-CM | POA: Diagnosis not present

## 2016-10-13 DIAGNOSIS — Z435 Encounter for attention to cystostomy: Secondary | ICD-10-CM | POA: Diagnosis not present

## 2016-10-13 DIAGNOSIS — E039 Hypothyroidism, unspecified: Secondary | ICD-10-CM | POA: Diagnosis not present

## 2016-10-13 DIAGNOSIS — G8929 Other chronic pain: Secondary | ICD-10-CM | POA: Diagnosis not present

## 2016-10-13 DIAGNOSIS — G35 Multiple sclerosis: Secondary | ICD-10-CM | POA: Diagnosis not present

## 2016-10-13 NOTE — Telephone Encounter (Signed)
Paperwork has been placed in the red folder.

## 2016-10-13 NOTE — Telephone Encounter (Signed)
Debra from Hospice called and stated that they discharges pt due to stability. They are requesting palliative to help patient. They are sending over a request and if Dr. Derrel Nip is in agreement to have her sign the form and fax it back.

## 2016-10-14 NOTE — Telephone Encounter (Signed)
Paper work has been faxed

## 2016-10-14 NOTE — Telephone Encounter (Signed)
Palliative care referral signed and returned in red folder

## 2016-10-15 ENCOUNTER — Telehealth: Payer: Self-pay | Admitting: Neurology

## 2016-10-15 NOTE — Telephone Encounter (Signed)
Pt's appointment  On 5-22 is re: her pump, pt is requesting a call back to know if she can be seen earlier that day.  I checked the scheduling and went out to Sept. For Dr Tobey Grim next avail.  Pt would like a call back to know if anything could be done (advised to message you by Stanton Kidney C on if anything could be done to get her in earlier that day)

## 2016-10-16 ENCOUNTER — Other Ambulatory Visit: Payer: Self-pay | Admitting: Family Medicine

## 2016-10-18 NOTE — Telephone Encounter (Signed)
Called and spoke with pt. R/s appt to earlier time as requested. Scheduled for 12pm, check in 1130/1145am same day. Pt verbalized understanding and appreciation for call.

## 2016-10-19 ENCOUNTER — Ambulatory Visit: Payer: Medicare Other | Admitting: Neurology

## 2016-10-19 ENCOUNTER — Encounter: Payer: Self-pay | Admitting: Neurology

## 2016-10-19 ENCOUNTER — Ambulatory Visit (INDEPENDENT_AMBULATORY_CARE_PROVIDER_SITE_OTHER): Payer: Medicare Other | Admitting: Neurology

## 2016-10-19 VITALS — BP 116/72 | HR 83 | Ht 62.0 in

## 2016-10-19 DIAGNOSIS — G35 Multiple sclerosis: Secondary | ICD-10-CM

## 2016-10-19 MED ORDER — BACLOFEN 10 MG/20ML IT SOLN
10.0000 mg | Freq: Once | INTRATHECAL | Status: AC
  Administered 2016-10-19: 10 mg via INTRATHECAL

## 2016-10-19 NOTE — Progress Notes (Signed)
Please refer tobacco pump procedure note.

## 2016-10-19 NOTE — Procedures (Signed)
      History:  Angelica Wilcox is a 71 year old patient with history of chronic progressive multiple sclerosis. She comes in today for a baclofen pump refill. About 2-1/2 months ago she noted onset of right facial numbness that has improved but has persisted to some degree. The patient reported no other changes of vision or strength. She continues to have troubles with memory.  Baclofen pump refill note  The baclofen pump site was cleaned with Betadine solution. A 21-gauge needle was inserted into the pump port site. Approximately 3 cc of residual baclofen was removed the baclofen pump indicates a 2.0 mL residual. 20 cc of replacement baclofen was placed into the pump at 500 mcg/cc concentration.  The pump was reprogrammed for the following settings: The pump was kept at a simple continuous rate of 109.97 g per day.  The alarm volume is set at 1.0 mL. The next alarm date is 01/13/2017.  The patient tolerated the procedure well. There were no complications of the above procedure.  The [place] number is [phone] The baclofen expiration date is July 2020. The baclofen lot number is [id number].

## 2016-10-21 ENCOUNTER — Telehealth: Payer: Self-pay | Admitting: Internal Medicine

## 2016-10-21 NOTE — Telephone Encounter (Signed)
FYI - Pt called and stated that we will be receiving a fax from [employer] and insurance information in regards to pt's medication. Pt states that she would like her medication list to stay the same.

## 2016-10-21 NOTE — Telephone Encounter (Signed)
Pt stated that they would be calling in regards to her hyoscyamine (LEVSIN, ANASPAZ) 0.125 MG tablet.   Elsa from [facility] called and was looking for additional information in regards to this medication. Please advise, thank you!  Call [phone]

## 2016-10-22 ENCOUNTER — Ambulatory Visit: Payer: Self-pay | Admitting: Neurology

## 2016-10-27 ENCOUNTER — Other Ambulatory Visit: Payer: Self-pay

## 2016-10-27 MED ORDER — DULOXETINE HCL 60 MG PO CPEP
60.0000 mg | ORAL_CAPSULE | Freq: Every day | ORAL | 0 refills | Status: DC
Start: 2016-10-27 — End: 2017-01-15

## 2016-10-27 MED ORDER — FUROSEMIDE 20 MG PO TABS: 20.0000 mg | ORAL_TABLET | Freq: Every day | ORAL | 0 refills | Status: DC

## 2016-10-27 MED ORDER — GABAPENTIN 600 MG PO TABS: 600.0000 mg | ORAL_TABLET | Freq: Three times a day (TID) | ORAL | 0 refills | Status: DC

## 2016-10-27 MED ORDER — HYOSCYAMINE SULFATE 0.125 MG PO TABS: ORAL_TABLET | ORAL | 0 refills | Status: AC

## 2016-10-27 NOTE — Telephone Encounter (Signed)
Spoke with lady from [facility] and answered all the additional information questions. The medication has been approved through the end of 2018. Holland Falling will fax over a copy of the approval to [place] and mail a copy to the pt.

## 2016-10-28 ENCOUNTER — Telehealth: Payer: Self-pay | Admitting: *Deleted

## 2016-10-28 DIAGNOSIS — Z9359 Other cystostomy status: Secondary | ICD-10-CM

## 2016-10-28 NOTE — Telephone Encounter (Signed)
Pt called at 2:28 to see if Dr. Derrel Nip has refilled this medication. Pt only has 1 pill.

## 2016-10-28 NOTE — Telephone Encounter (Signed)
Refilled: 09/16/2016 Last OV: 09/15/2016 Next OV: 12/17/2016

## 2016-10-28 NOTE — Telephone Encounter (Signed)
Medication Refill requested for : morphine MS contin  Pharmacy:[facility]  Return Contact :[phone]

## 2016-10-29 MED ORDER — MORPHINE SULFATE ER 30 MG PO TBCR: EXTENDED_RELEASE_TABLET | ORAL | 0 refills | Status: DC

## 2016-10-29 NOTE — Telephone Encounter (Signed)
Pt's caregiver informed of Dr.Tullo statement

## 2016-10-29 NOTE — Telephone Encounter (Signed)
Refilled

## 2016-10-29 NOTE — Telephone Encounter (Signed)
She can fill the one she has picked up and I will refill in 2 weeks

## 2016-10-29 NOTE — Telephone Encounter (Signed)
Left message for angela to return call back.

## 2016-10-29 NOTE — Telephone Encounter (Signed)
Patient is no longer a hospice patient and this Rx can be written 30 days

## 2016-10-29 NOTE — Telephone Encounter (Signed)
FYI

## 2016-10-29 NOTE — Telephone Encounter (Signed)
Please advise 

## 2016-10-29 NOTE — Telephone Encounter (Signed)
Pt caregiver called to follow up on pt refill. Please advise?  Thank you!  Pt has 2 pills left.

## 2016-10-29 NOTE — Telephone Encounter (Signed)
LMTCB. Need to let pt know that medication is up front ready to be picked up.   Rx has been placed up front.

## 2016-10-29 NOTE — Telephone Encounter (Signed)
Spoke with caregiver and she stated that she would be by to pick up the rx shortly.

## 2016-11-11 ENCOUNTER — Telehealth: Payer: Self-pay | Admitting: Internal Medicine

## 2016-11-11 MED ORDER — MORPHINE SULFATE ER 30 MG PO TBCR: EXTENDED_RELEASE_TABLET | ORAL | 0 refills | Status: DC

## 2016-11-11 MED ORDER — MORPHINE SULFATE ER 30 MG PO TBCR: EXTENDED_RELEASE_TABLET | ORAL | 0 refills | Status: AC

## 2016-11-11 NOTE — Telephone Encounter (Signed)
Spoke with Levada Dy, the pt's aide and informed her that the pt's rx has been put up front for pick up. The pt already has an appt scheduled for 12/17/2016 @ 10:00am and she is aware of the appt date and time. Spoke with Dr. Derrel Nip and she had me to go ahead and print off the next months rx so that they wouldn't have to make two trips up here before the appt date. Rx note stated that pt could not fill until on or after July 14th per Dr. Derrel Nip.

## 2016-11-11 NOTE — Telephone Encounter (Signed)
Refilled: 10/29/2016 Last OV: 09/15/2016 Next OV: 12/17/2016  Pt is no longer in Hospice can this be written for 30 days?

## 2016-11-11 NOTE — Telephone Encounter (Signed)
LMTCB. Need to let pt's aide know that the rx is up front for pick up and that the pt will need to sign a narcotics contract and schedule an office visit in July.

## 2016-11-11 NOTE — Telephone Encounter (Signed)
Pt's aide called requesting a refill on pt's morphine (MS CONTIN) 30 MG 12 hr tablet. Aide asked that we write it for 30 days, since she is no longer in hospice. Please advise, thank you!  Call pt @ [phone]

## 2016-11-11 NOTE — Telephone Encounter (Signed)
[place].  WILL  NEED TO SIGN A NARCOTICS CONTRACT AND BE SEEN IN jULY PRIOR TO ANY MORE REFILLS

## 2016-11-19 NOTE — Telephone Encounter (Signed)
  Your referral is in process as rested . Our referral coordinator will call you when the appointment has been made.  If you do not hear from Melissa in our office in a week,  Please call us back 

## 2016-11-19 NOTE — Telephone Encounter (Signed)
Please advise 

## 2016-11-19 NOTE — Telephone Encounter (Signed)
Pt called requesting a referral to urology. She states that she would like to see Larene Beach McGowen to change her suprapubic catheter. Please advise

## 2016-11-22 NOTE — Progress Notes (Signed)
11/23/2016 1:20 PM   Angelica Wilcox [birth year] [id number]  Referring provider: Crecencio Mc, MD [place], [place] [address]  Chief Complaint  Patient presents with  . New Patient (Initial Visit)    referred by Dr. Derrel Nip Chronic Suprapubic catheter    HPI: Patient is a 71 year old Caucasian female with end stage MS who is referred by Dr. Derrel Nip for SPT exchange with her caregiver, Levada Dy.    She had the SPT placed in 2014.   Since that time, her MS has started to progress in an aggressive manner.  She is having cognitive difficulties, increase in loss of muscle tone and pain.  She had been in hospice for the last three years.  Her SPT had been exchanged through hospice, but she has since been discharged from their care.  Her current SPT has been in place for the last three and one half months in hopes of acquiring an UTI that would result in sepsis as she is ready to end her life.    This did not occur, so she is wanting to restart SPT exchange monthly.    She has no difficulty with her SPT at this time.  She has not had fevers, chills, nausea or vomiting.  She has had some gross hematuria, but she attributes that to her colonization.    PMH: Past Medical History:  Diagnosis Date  . Calculus of gallbladder without mention of cholecystitis or obstruction   . Contracture of hand joint   . Diverticulitis of colon (without mention of hemorrhage)(562.11)   . Dyslipidemia   . Dysthymic disorder   . Esophageal reflux   . Headache(784.0)   . Multiple sclerosis ([place])   . Nonspecific elevation of levels of transaminase or lactic acid dehydrogenase (LDH)   . Osteoporosis, unspecified   . Other abnormality of urination(788.69)   . Other alteration of consciousness   . Other and unspecified hyperlipidemia   . Other B-complex deficiencies   . Other constipation   . Other malaise and fatigue   . Rectocele   . Unspecified essential hypertension   .  Unspecified urinary incontinence   . Unspecified vitamin D deficiency     Surgical History: Past Surgical History:  Procedure Laterality Date  . BACLOFEN PUMP REFILL  4-24/13   model No. [id number] @ [facility]  . Carotid Doppler  09/2002   Negative  . Colon Screen  1997  . DEXA  12/2008   OP, slt worse  . suprapubic tube surgery    . TONSILLECTOMY AND ADENOIDECTOMY    . TUBAL LIGATION      Home Medications:  Allergies as of 11/23/2016      Reactions   Alendronate Sodium    REACTION: GI   Atorvastatin    REACTION: myalgia   Bactrim Nausea And Vomiting   Bupropion    Ciprofloxacin Nausea And Vomiting   REACTION: vomiting   Nitrofurantoin Diarrhea, Nausea And Vomiting, Other (See Comments)   Night sweats   Pneumovax [pneumococcal Polysaccharide Vaccine]    shingles   Sulfamethoxazole-trimethoprim Nausea And Vomiting   Influenza Virus Vacc Split Pf Rash   Shingles       Medication List       Accurate as of 11/23/16  1:20 PM. Always use your most recent med list.          DULoxetine 60 MG capsule Commonly known as:  CYMBALTA Take 1 capsule (60 mg total) by mouth at bedtime.  furosemide 20 MG tablet Commonly known as:  LASIX Take 1 tablet (20 mg total) by mouth daily.   gabapentin 600 MG tablet Commonly known as:  NEURONTIN Take 1 tablet (600 mg total) by mouth 3 (three) times daily.   HYDROcodone-acetaminophen 5-325 MG tablet Commonly known as:  NORCO/VICODIN Take by mouth.   hyoscyamine 0.125 MG tablet Commonly known as:  LEVSIN, ANASPAZ TAKE 1 TABLET BY MOUTH THREE TIMES A DAY AS NEEDED FOR ABDOMINAL PAIN   LORazepam 0.5 MG tablet Commonly known as:  ATIVAN   morphine 10 MG/5ML solution Take by mouth every 2 (two) hours as needed for severe pain.   morphine 30 MG 12 hr tablet Commonly known as:  MS CONTIN 1 tablet every 8 hours AS NEEDED FOR SEVERE PAIN   nystatin cream Commonly known as:  MYCOSTATIN Reported on 08/13/2015   Nystatin Powd Apply twice  daily to irritated area   UNABLE TO FIND Please dispense # 1 nebulizer with air compressor, corr tubing- disposable and a aerosol mask for patient's nebulizer.  Dx: G40   Wheelchair Misc 1 each by Other route.       Allergies:  Allergies  Allergen Reactions  . Alendronate Sodium     REACTION: GI  . Atorvastatin     REACTION: myalgia  . Bactrim Nausea And Vomiting  . Bupropion   . Ciprofloxacin Nausea And Vomiting    REACTION: vomiting  . Nitrofurantoin Diarrhea, Nausea And Vomiting and Other (See Comments)    Night sweats  . Pneumovax [Pneumococcal Polysaccharide Vaccine]     shingles  . Sulfamethoxazole-Trimethoprim Nausea And Vomiting  . Influenza Virus Vacc Split Pf Rash    Shingles     Family History: Family History  Problem Relation Age of Onset  . Cancer Father   . Hypertension Father   . Alcohol abuse Father   . Heart failure Father        CHF  . Glaucoma Mother   . Pulmonary fibrosis Mother   . Stroke Daughter   . Celiac disease Unknown        sibling  . Kidney cancer Neg Hx   . Bladder Cancer Neg Hx     Social History:  reports that she quit smoking about 40 years ago. She has never used smokeless tobacco. She reports that she drinks alcohol. She reports that she uses drugs, including Marijuana.  ROS: UROLOGY Frequent Urination?: No Hard to postpone urination?: No Burning/pain with urination?: No Get up at night to urinate?: No Leakage of urine?: No Urine stream starts and stops?: No Trouble starting stream?: No Do you have to strain to urinate?: No Blood in urine?: No Urinary tract infection?: No Sexually transmitted disease?: No Injury to kidneys or bladder?: No Painful intercourse?: No Weak stream?: No Currently pregnant?: No Vaginal bleeding?: No Last menstrual period?: n  Gastrointestinal Nausea?: No Vomiting?: No Indigestion/heartburn?: No Diarrhea?: No Constipation?: Yes  Constitutional Fever: No Night sweats?: No Weight  loss?: No Fatigue?: Yes  Skin Skin rash/lesions?: No Itching?: No  Eyes Blurred vision?: Yes Double vision?: No  Ears/Nose/Throat Sore throat?: No Sinus problems?: No  Hematologic/Lymphatic Swollen glands?: No Easy bruising?: No  Cardiovascular Leg swelling?: Yes Chest pain?: No  Respiratory Cough?: No Shortness of breath?: No  Endocrine Excessive thirst?: No  Musculoskeletal Back pain?: No Joint pain?: No  Neurological Headaches?: No Dizziness?: No  Psychologic Depression?: No Anxiety?: No  Physical Exam: BP 132/76   Pulse 94   Ht 5\' 3"  (1.6  m)   Constitutional: Well nourished. Alert and oriented, No acute distress. HEENT: [place] AT, moist mucus membranes. Trachea midline, no masses. Cardiovascular: No clubbing, cyanosis, or edema. Respiratory: Normal respiratory effort, no increased work of breathing. GI: Abdomen is soft, non tender, non distended, no abdominal masses. Liver and spleen not palpable.  No hernias appreciated.  Stool sample for occult testing is not indicated.   GU: No CVA tenderness.  No bladder fullness or masses.    Skin: No rashes, bruises or suspicious lesions. Lymph: No cervical or inguinal adenopathy. Neurologic: Grossly intact, no focal deficits, moving all 4 extremities. Psychiatric: Normal mood and affect.  Laboratory Data:  Lab Results  Component Value Date   CREATININE 0.50 02/20/2016    Lab Results  Component Value Date   TSH 7.28 (H) 02/20/2016     Lab Results  Component Value Date   AST 50 (H) 02/20/2016   Lab Results  Component Value Date   ALT 55 (H) 02/20/2016    Procedure Suprapubic Cath Change Patient is present today for a suprapubic catheter change due to urinary retention.  10 ml of water was drained from the balloon, a 18 FR foley cath was removed from the tract with out difficulty.  Site was cleaned and prepped in a sterile fashion with betadine.  A 18 FR foley cath was replaced into the tract no  complications were noted. Urine return was noted, 10 ml of sterile water was inflated into the balloon and a over night bag was attached for drainage.  Patient tolerated well. A night bag was given to patient and proper instruction was given on how to switch bags.    Preformed by: Zara Council, PA-C and Rip Harbour (student)     Assessment & Plan:    1. Neurogenic bladder  - RTC in one month for catheter exchange    Return in about 1 month (around 12/23/2016) for SPT exchange.  These notes generated with voice recognition software. I apologize for typographical errors.  Zara Council, [facility] [address] [place], [place] [address] [phone]

## 2016-11-23 ENCOUNTER — Encounter: Payer: Self-pay | Admitting: Urology

## 2016-11-23 ENCOUNTER — Ambulatory Visit (INDEPENDENT_AMBULATORY_CARE_PROVIDER_SITE_OTHER): Payer: Medicare Other | Admitting: Urology

## 2016-11-23 VITALS — BP 132/76 | HR 94 | Ht 63.0 in

## 2016-11-23 DIAGNOSIS — N319 Neuromuscular dysfunction of bladder, unspecified: Secondary | ICD-10-CM

## 2016-11-26 ENCOUNTER — Telehealth: Payer: Self-pay | Admitting: Urology

## 2016-11-26 NOTE — Telephone Encounter (Signed)
Pt is having problems with her catheter, really thick blood coming from it.  She had it changed this past Tuesday.  Please give her caregiver, Levada Dy a call at [phone].

## 2016-11-26 NOTE — Telephone Encounter (Signed)
Spoke to patient and caregiver. Caregiver says seeing more blood in bag than normal. Catheter is still draining. No clots.  Advised pt could come in today for check and irrigation. Pt wanted to wait over the weekend to see if it would pass, and call on Monday with update. Advised pt and caregiver if catheter stops draining over weekend go to ED, otherwise probably irritation from change on Tue. Patient and caregiver verbalized understanding.

## 2016-12-14 ENCOUNTER — Telehealth: Payer: Self-pay | Admitting: Internal Medicine

## 2016-12-14 NOTE — Telephone Encounter (Signed)
LMTCB

## 2016-12-14 NOTE — Telephone Encounter (Signed)
Pt caregiver states that pt has not been eating.and wanted to give more info Please advise caregiver at [phone]

## 2016-12-15 NOTE — Telephone Encounter (Signed)
Tell her not to worry about the OV .  I will refill her medications when needed .

## 2016-12-15 NOTE — Telephone Encounter (Signed)
LMTCB

## 2016-12-15 NOTE — Telephone Encounter (Signed)
Please call pt at [phone]

## 2016-12-15 NOTE — Telephone Encounter (Signed)
Notified pt's caregiver of message below. Caregiver stated that she would give [place] a call when pt needs refills.

## 2016-12-15 NOTE — Telephone Encounter (Signed)
Spoke with pt's caregiver, Levada Dy, and she stated that the pt is now on day 19 of no food at all. She stated that the pt is still taking in fluids. However she did mention that the pt is "ready to go". She stated the reason for the call is because the pt has an appt on Friday the 20th with you but her lift in the [place] is broken and they have no way of getting her here, so they were wondering if they would be able to still get the pt's medications refilled when it comes time if the pt is still living.

## 2016-12-17 ENCOUNTER — Ambulatory Visit: Admitting: Internal Medicine

## 2016-12-22 ENCOUNTER — Telehealth: Payer: Self-pay | Admitting: Internal Medicine

## 2016-12-22 ENCOUNTER — Ambulatory Visit: Payer: Medicare Other

## 2016-12-22 NOTE — Telephone Encounter (Signed)
Pt caregiver called about wanting to know if we received a form for hospice to be brought back into the home? It was supposed to have been fax on Monday from [place]. Please advise?  Call [facility] caregiver @ [phone]. Thank you!

## 2016-12-22 NOTE — Telephone Encounter (Signed)
Angelica Wilcox called back returning your call. Please advise, thank you!  Call pt @ [phone]

## 2016-12-22 NOTE — Telephone Encounter (Signed)
Spoke with Levada Dy and informed her that we faxed the hospice orders back on 12/20/2016. She stated that she would call and check with hospice again.

## 2016-12-22 NOTE — Telephone Encounter (Signed)
LMTCB

## 2016-12-23 DIAGNOSIS — Z466 Encounter for fitting and adjustment of urinary device: Secondary | ICD-10-CM | POA: Diagnosis not present

## 2016-12-23 DIAGNOSIS — E785 Hyperlipidemia, unspecified: Secondary | ICD-10-CM | POA: Diagnosis not present

## 2016-12-23 DIAGNOSIS — G35 Multiple sclerosis: Secondary | ICD-10-CM | POA: Diagnosis not present

## 2016-12-23 DIAGNOSIS — M6250 Muscle wasting and atrophy, not elsewhere classified, unspecified site: Secondary | ICD-10-CM | POA: Diagnosis not present

## 2016-12-23 DIAGNOSIS — M62422 Contracture of muscle, left upper arm: Secondary | ICD-10-CM | POA: Diagnosis not present

## 2016-12-23 DIAGNOSIS — G894 Chronic pain syndrome: Secondary | ICD-10-CM | POA: Diagnosis not present

## 2016-12-23 DIAGNOSIS — I10 Essential (primary) hypertension: Secondary | ICD-10-CM | POA: Diagnosis not present

## 2016-12-23 DIAGNOSIS — E43 Unspecified severe protein-calorie malnutrition: Secondary | ICD-10-CM | POA: Diagnosis not present

## 2016-12-23 DIAGNOSIS — Z7401 Bed confinement status: Secondary | ICD-10-CM | POA: Diagnosis not present

## 2016-12-23 DIAGNOSIS — M1991 Primary osteoarthritis, unspecified site: Secondary | ICD-10-CM | POA: Diagnosis not present

## 2016-12-24 DIAGNOSIS — G35 Multiple sclerosis: Secondary | ICD-10-CM | POA: Diagnosis not present

## 2016-12-24 DIAGNOSIS — G894 Chronic pain syndrome: Secondary | ICD-10-CM | POA: Diagnosis not present

## 2016-12-24 DIAGNOSIS — Z7401 Bed confinement status: Secondary | ICD-10-CM | POA: Diagnosis not present

## 2016-12-24 DIAGNOSIS — M6250 Muscle wasting and atrophy, not elsewhere classified, unspecified site: Secondary | ICD-10-CM | POA: Diagnosis not present

## 2016-12-24 DIAGNOSIS — M62422 Contracture of muscle, left upper arm: Secondary | ICD-10-CM | POA: Diagnosis not present

## 2016-12-24 DIAGNOSIS — E43 Unspecified severe protein-calorie malnutrition: Secondary | ICD-10-CM | POA: Diagnosis not present

## 2016-12-25 DIAGNOSIS — Z7401 Bed confinement status: Secondary | ICD-10-CM | POA: Diagnosis not present

## 2016-12-25 DIAGNOSIS — M62422 Contracture of muscle, left upper arm: Secondary | ICD-10-CM | POA: Diagnosis not present

## 2016-12-25 DIAGNOSIS — E43 Unspecified severe protein-calorie malnutrition: Secondary | ICD-10-CM | POA: Diagnosis not present

## 2016-12-25 DIAGNOSIS — M6250 Muscle wasting and atrophy, not elsewhere classified, unspecified site: Secondary | ICD-10-CM | POA: Diagnosis not present

## 2016-12-25 DIAGNOSIS — G894 Chronic pain syndrome: Secondary | ICD-10-CM | POA: Diagnosis not present

## 2016-12-25 DIAGNOSIS — G35 Multiple sclerosis: Secondary | ICD-10-CM | POA: Diagnosis not present

## 2016-12-26 DIAGNOSIS — G35 Multiple sclerosis: Secondary | ICD-10-CM | POA: Diagnosis not present

## 2016-12-26 DIAGNOSIS — G894 Chronic pain syndrome: Secondary | ICD-10-CM | POA: Diagnosis not present

## 2016-12-26 DIAGNOSIS — M62422 Contracture of muscle, left upper arm: Secondary | ICD-10-CM | POA: Diagnosis not present

## 2016-12-26 DIAGNOSIS — Z7401 Bed confinement status: Secondary | ICD-10-CM | POA: Diagnosis not present

## 2016-12-26 DIAGNOSIS — E43 Unspecified severe protein-calorie malnutrition: Secondary | ICD-10-CM | POA: Diagnosis not present

## 2016-12-26 DIAGNOSIS — M6250 Muscle wasting and atrophy, not elsewhere classified, unspecified site: Secondary | ICD-10-CM | POA: Diagnosis not present

## 2016-12-27 DIAGNOSIS — G35 Multiple sclerosis: Secondary | ICD-10-CM | POA: Diagnosis not present

## 2016-12-27 DIAGNOSIS — G894 Chronic pain syndrome: Secondary | ICD-10-CM | POA: Diagnosis not present

## 2016-12-27 DIAGNOSIS — M6250 Muscle wasting and atrophy, not elsewhere classified, unspecified site: Secondary | ICD-10-CM | POA: Diagnosis not present

## 2016-12-27 DIAGNOSIS — Z7401 Bed confinement status: Secondary | ICD-10-CM | POA: Diagnosis not present

## 2016-12-27 DIAGNOSIS — E43 Unspecified severe protein-calorie malnutrition: Secondary | ICD-10-CM | POA: Diagnosis not present

## 2016-12-27 DIAGNOSIS — M62422 Contracture of muscle, left upper arm: Secondary | ICD-10-CM | POA: Diagnosis not present

## 2016-12-28 DIAGNOSIS — G35 Multiple sclerosis: Secondary | ICD-10-CM | POA: Diagnosis not present

## 2016-12-28 DIAGNOSIS — M6250 Muscle wasting and atrophy, not elsewhere classified, unspecified site: Secondary | ICD-10-CM | POA: Diagnosis not present

## 2016-12-28 DIAGNOSIS — Z7401 Bed confinement status: Secondary | ICD-10-CM | POA: Diagnosis not present

## 2016-12-28 DIAGNOSIS — E43 Unspecified severe protein-calorie malnutrition: Secondary | ICD-10-CM | POA: Diagnosis not present

## 2016-12-28 DIAGNOSIS — G894 Chronic pain syndrome: Secondary | ICD-10-CM | POA: Diagnosis not present

## 2016-12-28 DIAGNOSIS — M62422 Contracture of muscle, left upper arm: Secondary | ICD-10-CM | POA: Diagnosis not present

## 2016-12-29 DIAGNOSIS — Z466 Encounter for fitting and adjustment of urinary device: Secondary | ICD-10-CM | POA: Diagnosis not present

## 2016-12-29 DIAGNOSIS — I10 Essential (primary) hypertension: Secondary | ICD-10-CM | POA: Diagnosis not present

## 2016-12-29 DIAGNOSIS — G894 Chronic pain syndrome: Secondary | ICD-10-CM | POA: Diagnosis not present

## 2016-12-29 DIAGNOSIS — M62422 Contracture of muscle, left upper arm: Secondary | ICD-10-CM | POA: Diagnosis not present

## 2016-12-29 DIAGNOSIS — M1991 Primary osteoarthritis, unspecified site: Secondary | ICD-10-CM | POA: Diagnosis not present

## 2016-12-29 DIAGNOSIS — E43 Unspecified severe protein-calorie malnutrition: Secondary | ICD-10-CM | POA: Diagnosis not present

## 2016-12-29 DIAGNOSIS — E785 Hyperlipidemia, unspecified: Secondary | ICD-10-CM | POA: Diagnosis not present

## 2016-12-29 DIAGNOSIS — M6250 Muscle wasting and atrophy, not elsewhere classified, unspecified site: Secondary | ICD-10-CM | POA: Diagnosis not present

## 2016-12-29 DIAGNOSIS — Z7401 Bed confinement status: Secondary | ICD-10-CM | POA: Diagnosis not present

## 2016-12-29 DIAGNOSIS — G35 Multiple sclerosis: Secondary | ICD-10-CM | POA: Diagnosis not present

## 2016-12-30 ENCOUNTER — Other Ambulatory Visit: Payer: Self-pay

## 2016-12-30 ENCOUNTER — Telehealth: Payer: Self-pay | Admitting: Neurology

## 2016-12-30 ENCOUNTER — Telehealth: Payer: Self-pay | Admitting: Internal Medicine

## 2016-12-30 DIAGNOSIS — E43 Unspecified severe protein-calorie malnutrition: Secondary | ICD-10-CM | POA: Diagnosis not present

## 2016-12-30 DIAGNOSIS — G894 Chronic pain syndrome: Secondary | ICD-10-CM | POA: Diagnosis not present

## 2016-12-30 DIAGNOSIS — G35 Multiple sclerosis: Secondary | ICD-10-CM | POA: Diagnosis not present

## 2016-12-30 DIAGNOSIS — M6250 Muscle wasting and atrophy, not elsewhere classified, unspecified site: Secondary | ICD-10-CM | POA: Diagnosis not present

## 2016-12-30 DIAGNOSIS — Z7401 Bed confinement status: Secondary | ICD-10-CM | POA: Diagnosis not present

## 2016-12-30 DIAGNOSIS — M62422 Contracture of muscle, left upper arm: Secondary | ICD-10-CM | POA: Diagnosis not present

## 2016-12-30 NOTE — Telephone Encounter (Signed)
Please advise 

## 2016-12-30 NOTE — Telephone Encounter (Signed)
I called and talked with the daughter. The patient has decided to stop eating and she is not drinking much, hospice is involved, death is expected sometime in the next several days.  The patient has a baclofen pump in place, this will continue to work and the battery eventually will die, but the unit will alarm at some point.

## 2016-12-30 NOTE — Telephone Encounter (Signed)
Mitzi from Hospice called and wanted to know if it would be ok to give pt her gabapentin (NEURONTIN) 600 MG tablet rectally, hyoscyamine (LEVSIN, ANASPAZ) 0.125 MG tablet can they switch to the patches. Please advise, thank you!  Call [facility] @ [phone]

## 2016-12-30 NOTE — Telephone Encounter (Signed)
I called regarding this patient, left a message, I will call back later.

## 2016-12-30 NOTE — Telephone Encounter (Signed)
Pt daughter has called to inform that pt is down to a few more days of living and there is information that she would like to share with Dr Jannifer Franklin, please call

## 2016-12-30 NOTE — Telephone Encounter (Signed)
Yes, they can

## 2016-12-30 NOTE — Telephone Encounter (Signed)
Spoke with Mitzi, from Hospice and gave her the verbal okay for orders below.

## 2016-12-30 NOTE — Telephone Encounter (Signed)
Refilled: 11/11/2016  From what I can tell in the historical medication list Last OV: 09/15/2016 Next OV: not scheduled  Hospice pt.

## 2016-12-31 ENCOUNTER — Telehealth: Payer: Self-pay | Admitting: Internal Medicine

## 2016-12-31 MED ORDER — MORPHINE SULFATE 10 MG/5ML PO SOLN: 2.5000 mg | ORAL | 0 refills | Status: DC | PRN

## 2016-12-31 NOTE — Telephone Encounter (Signed)
roxanol given but was not written correctly. Patient actively dying New Rx written #35ml x 0 10-20mg  every 2 hours prn Emailed to [employer] and she will get it to [facility] She confirmed receipt

## 2016-12-31 NOTE — Telephone Encounter (Signed)
Signed please fax asap

## 2016-12-31 NOTE — Telephone Encounter (Signed)
PC from [facility] called Rx for

## 2016-12-31 NOTE — Telephone Encounter (Signed)
rx has been printed, signed and faxed.  

## 2017-01-01 ENCOUNTER — Other Ambulatory Visit: Payer: Self-pay | Admitting: Internal Medicine

## 2017-01-01 DIAGNOSIS — Z7401 Bed confinement status: Secondary | ICD-10-CM | POA: Diagnosis not present

## 2017-01-01 DIAGNOSIS — G894 Chronic pain syndrome: Secondary | ICD-10-CM | POA: Diagnosis not present

## 2017-01-01 DIAGNOSIS — G35 Multiple sclerosis: Secondary | ICD-10-CM | POA: Diagnosis not present

## 2017-01-01 DIAGNOSIS — M62422 Contracture of muscle, left upper arm: Secondary | ICD-10-CM | POA: Diagnosis not present

## 2017-01-01 DIAGNOSIS — M6250 Muscle wasting and atrophy, not elsewhere classified, unspecified site: Secondary | ICD-10-CM | POA: Diagnosis not present

## 2017-01-01 DIAGNOSIS — E43 Unspecified severe protein-calorie malnutrition: Secondary | ICD-10-CM | POA: Diagnosis not present

## 2017-01-01 MED ORDER — MORPHINE SULFATE 10 MG/5ML PO SOLN: ORAL | 0 refills | Status: AC

## 2017-01-01 NOTE — Telephone Encounter (Addendum)
THANK YOUR FOR HANDLING .  I HAVE   CORRECTED THE RX IN CHART FOR FUTURE REFILLS

## 2017-01-02 DIAGNOSIS — M62422 Contracture of muscle, left upper arm: Secondary | ICD-10-CM | POA: Diagnosis not present

## 2017-01-02 DIAGNOSIS — Z7401 Bed confinement status: Secondary | ICD-10-CM | POA: Diagnosis not present

## 2017-01-02 DIAGNOSIS — M6250 Muscle wasting and atrophy, not elsewhere classified, unspecified site: Secondary | ICD-10-CM | POA: Diagnosis not present

## 2017-01-02 DIAGNOSIS — E43 Unspecified severe protein-calorie malnutrition: Secondary | ICD-10-CM | POA: Diagnosis not present

## 2017-01-02 DIAGNOSIS — G894 Chronic pain syndrome: Secondary | ICD-10-CM | POA: Diagnosis not present

## 2017-01-02 DIAGNOSIS — G35 Multiple sclerosis: Secondary | ICD-10-CM | POA: Diagnosis not present

## 2017-01-03 DIAGNOSIS — M62422 Contracture of muscle, left upper arm: Secondary | ICD-10-CM | POA: Diagnosis not present

## 2017-01-03 DIAGNOSIS — M6250 Muscle wasting and atrophy, not elsewhere classified, unspecified site: Secondary | ICD-10-CM | POA: Diagnosis not present

## 2017-01-03 DIAGNOSIS — G894 Chronic pain syndrome: Secondary | ICD-10-CM | POA: Diagnosis not present

## 2017-01-03 DIAGNOSIS — Z7401 Bed confinement status: Secondary | ICD-10-CM | POA: Diagnosis not present

## 2017-01-03 DIAGNOSIS — E43 Unspecified severe protein-calorie malnutrition: Secondary | ICD-10-CM | POA: Diagnosis not present

## 2017-01-03 DIAGNOSIS — G35 Multiple sclerosis: Secondary | ICD-10-CM | POA: Diagnosis not present

## 2017-01-04 DIAGNOSIS — M6250 Muscle wasting and atrophy, not elsewhere classified, unspecified site: Secondary | ICD-10-CM | POA: Diagnosis not present

## 2017-01-04 DIAGNOSIS — G894 Chronic pain syndrome: Secondary | ICD-10-CM | POA: Diagnosis not present

## 2017-01-04 DIAGNOSIS — M62422 Contracture of muscle, left upper arm: Secondary | ICD-10-CM | POA: Diagnosis not present

## 2017-01-04 DIAGNOSIS — Z7401 Bed confinement status: Secondary | ICD-10-CM | POA: Diagnosis not present

## 2017-01-04 DIAGNOSIS — G35 Multiple sclerosis: Secondary | ICD-10-CM | POA: Diagnosis not present

## 2017-01-04 DIAGNOSIS — E43 Unspecified severe protein-calorie malnutrition: Secondary | ICD-10-CM | POA: Diagnosis not present

## 2017-01-05 DIAGNOSIS — Z7401 Bed confinement status: Secondary | ICD-10-CM | POA: Diagnosis not present

## 2017-01-05 DIAGNOSIS — M62422 Contracture of muscle, left upper arm: Secondary | ICD-10-CM | POA: Diagnosis not present

## 2017-01-05 DIAGNOSIS — G894 Chronic pain syndrome: Secondary | ICD-10-CM | POA: Diagnosis not present

## 2017-01-05 DIAGNOSIS — M6250 Muscle wasting and atrophy, not elsewhere classified, unspecified site: Secondary | ICD-10-CM | POA: Diagnosis not present

## 2017-01-05 DIAGNOSIS — E43 Unspecified severe protein-calorie malnutrition: Secondary | ICD-10-CM | POA: Diagnosis not present

## 2017-01-05 DIAGNOSIS — G35 Multiple sclerosis: Secondary | ICD-10-CM | POA: Diagnosis not present

## 2017-01-06 ENCOUNTER — Ambulatory Visit: Admitting: Neurology

## 2017-01-06 DIAGNOSIS — Z7401 Bed confinement status: Secondary | ICD-10-CM | POA: Diagnosis not present

## 2017-01-06 DIAGNOSIS — M62422 Contracture of muscle, left upper arm: Secondary | ICD-10-CM | POA: Diagnosis not present

## 2017-01-06 DIAGNOSIS — E43 Unspecified severe protein-calorie malnutrition: Secondary | ICD-10-CM | POA: Diagnosis not present

## 2017-01-06 DIAGNOSIS — M6250 Muscle wasting and atrophy, not elsewhere classified, unspecified site: Secondary | ICD-10-CM | POA: Diagnosis not present

## 2017-01-06 DIAGNOSIS — G35 Multiple sclerosis: Secondary | ICD-10-CM | POA: Diagnosis not present

## 2017-01-06 DIAGNOSIS — G894 Chronic pain syndrome: Secondary | ICD-10-CM | POA: Diagnosis not present

## 2017-01-07 DIAGNOSIS — G894 Chronic pain syndrome: Secondary | ICD-10-CM | POA: Diagnosis not present

## 2017-01-07 DIAGNOSIS — G35 Multiple sclerosis: Secondary | ICD-10-CM | POA: Diagnosis not present

## 2017-01-07 DIAGNOSIS — Z7401 Bed confinement status: Secondary | ICD-10-CM | POA: Diagnosis not present

## 2017-01-07 DIAGNOSIS — E43 Unspecified severe protein-calorie malnutrition: Secondary | ICD-10-CM | POA: Diagnosis not present

## 2017-01-07 DIAGNOSIS — M62422 Contracture of muscle, left upper arm: Secondary | ICD-10-CM | POA: Diagnosis not present

## 2017-01-07 DIAGNOSIS — M6250 Muscle wasting and atrophy, not elsewhere classified, unspecified site: Secondary | ICD-10-CM | POA: Diagnosis not present

## 2017-01-15 ENCOUNTER — Other Ambulatory Visit: Payer: Self-pay | Admitting: Internal Medicine

## 2017-01-29 DEATH — deceased

## 2017-05-04 ENCOUNTER — Ambulatory Visit: Payer: PRIVATE HEALTH INSURANCE

## 2019-03-20 NOTE — Telephone Encounter (Signed)
Error
